# Patient Record
Sex: Male | Born: 1945 | Race: White | Hispanic: No | Smoking: Current every day smoker
Health system: Southern US, Community
[De-identification: ages and names within clinical notes are randomized; demographics above are authoritative.]

## PROBLEM LIST (undated history)

## (undated) DIAGNOSIS — K219 Gastro-esophageal reflux disease without esophagitis: Secondary | ICD-10-CM

## (undated) DIAGNOSIS — H3530 Unspecified macular degeneration: Secondary | ICD-10-CM

## (undated) DIAGNOSIS — A809 Acute poliomyelitis, unspecified: Secondary | ICD-10-CM

## (undated) DIAGNOSIS — Z87442 Personal history of urinary calculi: Secondary | ICD-10-CM

## (undated) DIAGNOSIS — G4700 Insomnia, unspecified: Secondary | ICD-10-CM

## (undated) DIAGNOSIS — M1990 Unspecified osteoarthritis, unspecified site: Secondary | ICD-10-CM

## (undated) DIAGNOSIS — N400 Enlarged prostate without lower urinary tract symptoms: Secondary | ICD-10-CM

## (undated) DIAGNOSIS — Z860100 Personal history of colon polyps, unspecified: Secondary | ICD-10-CM

## (undated) DIAGNOSIS — T7840XA Allergy, unspecified, initial encounter: Secondary | ICD-10-CM

## (undated) DIAGNOSIS — I10 Essential (primary) hypertension: Secondary | ICD-10-CM

## (undated) DIAGNOSIS — I714 Abdominal aortic aneurysm, without rupture, unspecified: Secondary | ICD-10-CM

## (undated) DIAGNOSIS — M109 Gout, unspecified: Secondary | ICD-10-CM

## (undated) DIAGNOSIS — J449 Chronic obstructive pulmonary disease, unspecified: Secondary | ICD-10-CM

## (undated) DIAGNOSIS — G20 Parkinson's disease: Secondary | ICD-10-CM

## (undated) DIAGNOSIS — F419 Anxiety disorder, unspecified: Secondary | ICD-10-CM

## (undated) DIAGNOSIS — Z8709 Personal history of other diseases of the respiratory system: Secondary | ICD-10-CM

## (undated) DIAGNOSIS — I719 Aortic aneurysm of unspecified site, without rupture: Secondary | ICD-10-CM

## (undated) HISTORY — DX: Essential (primary) hypertension: I10

## (undated) HISTORY — DX: Gout, unspecified: M10.9

## (undated) HISTORY — DX: Anxiety disorder, unspecified: F41.9

## (undated) HISTORY — DX: Aortic aneurysm of unspecified site, without rupture: I71.9

## (undated) HISTORY — PX: APPENDECTOMY: SHX54

## (undated) HISTORY — DX: Abdominal aortic aneurysm, without rupture, unspecified: I71.40

## (undated) HISTORY — DX: Gastro-esophageal reflux disease without esophagitis: K21.9

## (undated) HISTORY — DX: Abdominal aortic aneurysm, without rupture: I71.4

## (undated) HISTORY — DX: Parkinson's disease: G20

## (undated) HISTORY — PX: OTHER SURGICAL HISTORY: SHX169

## (undated) HISTORY — PX: CHOLECYSTECTOMY: SHX55

---

## 2001-01-01 ENCOUNTER — Emergency Department (HOSPITAL_COMMUNITY): Admission: EM | Admit: 2001-01-01 | Discharge: 2001-01-01 | Payer: Self-pay | Admitting: Internal Medicine

## 2002-03-31 ENCOUNTER — Emergency Department (HOSPITAL_COMMUNITY): Admission: EM | Admit: 2002-03-31 | Discharge: 2002-03-31 | Payer: Self-pay | Admitting: *Deleted

## 2002-10-25 ENCOUNTER — Emergency Department (HOSPITAL_COMMUNITY): Admission: EM | Admit: 2002-10-25 | Discharge: 2002-10-25 | Payer: Self-pay | Admitting: Internal Medicine

## 2004-06-17 ENCOUNTER — Emergency Department (HOSPITAL_COMMUNITY): Admission: EM | Admit: 2004-06-17 | Discharge: 2004-06-18 | Payer: Self-pay | Admitting: Emergency Medicine

## 2004-07-19 DIAGNOSIS — G20A1 Parkinson's disease without dyskinesia, without mention of fluctuations: Secondary | ICD-10-CM

## 2004-07-19 DIAGNOSIS — G20 Parkinson's disease: Secondary | ICD-10-CM

## 2004-07-19 HISTORY — DX: Parkinson's disease without dyskinesia, without mention of fluctuations: G20.A1

## 2004-07-19 HISTORY — DX: Parkinson's disease: G20

## 2004-11-18 ENCOUNTER — Emergency Department (HOSPITAL_COMMUNITY): Admission: EM | Admit: 2004-11-18 | Discharge: 2004-11-18 | Payer: Self-pay | Admitting: *Deleted

## 2004-11-19 ENCOUNTER — Emergency Department (HOSPITAL_COMMUNITY): Admission: EM | Admit: 2004-11-19 | Discharge: 2004-11-20 | Payer: Self-pay | Admitting: *Deleted

## 2004-11-21 ENCOUNTER — Emergency Department (HOSPITAL_COMMUNITY): Admission: EM | Admit: 2004-11-21 | Discharge: 2004-11-21 | Payer: Self-pay | Admitting: Emergency Medicine

## 2005-07-19 DIAGNOSIS — I719 Aortic aneurysm of unspecified site, without rupture: Secondary | ICD-10-CM

## 2005-07-19 HISTORY — PX: KIDNEY SURGERY: SHX687

## 2005-07-19 HISTORY — DX: Aortic aneurysm of unspecified site, without rupture: I71.9

## 2005-07-19 HISTORY — PX: ABDOMINAL AORTIC ANEURYSM REPAIR: SHX42

## 2005-10-07 ENCOUNTER — Ambulatory Visit (HOSPITAL_COMMUNITY): Admission: RE | Admit: 2005-10-07 | Discharge: 2005-10-07 | Payer: Self-pay | Admitting: Pulmonary Disease

## 2005-10-15 ENCOUNTER — Ambulatory Visit (HOSPITAL_COMMUNITY): Admission: RE | Admit: 2005-10-15 | Discharge: 2005-10-15 | Payer: Self-pay | Admitting: Pulmonary Disease

## 2005-11-04 ENCOUNTER — Ambulatory Visit (HOSPITAL_COMMUNITY): Admission: RE | Admit: 2005-11-04 | Discharge: 2005-11-04 | Payer: Self-pay | Admitting: Vascular Surgery

## 2005-11-08 ENCOUNTER — Inpatient Hospital Stay (HOSPITAL_COMMUNITY): Admission: RE | Admit: 2005-11-08 | Discharge: 2005-11-14 | Payer: Self-pay | Admitting: Vascular Surgery

## 2006-02-28 ENCOUNTER — Inpatient Hospital Stay (HOSPITAL_COMMUNITY): Admission: RE | Admit: 2006-02-28 | Discharge: 2006-03-03 | Payer: Self-pay | Admitting: Urology

## 2006-02-28 ENCOUNTER — Encounter (INDEPENDENT_AMBULATORY_CARE_PROVIDER_SITE_OTHER): Payer: Self-pay | Admitting: *Deleted

## 2006-05-26 ENCOUNTER — Ambulatory Visit (HOSPITAL_COMMUNITY): Admission: RE | Admit: 2006-05-26 | Discharge: 2006-05-26 | Payer: Self-pay | Admitting: Urology

## 2006-09-09 ENCOUNTER — Ambulatory Visit (HOSPITAL_COMMUNITY): Admission: RE | Admit: 2006-09-09 | Discharge: 2006-09-09 | Payer: Self-pay | Admitting: Urology

## 2006-12-27 ENCOUNTER — Ambulatory Visit (HOSPITAL_COMMUNITY): Admission: RE | Admit: 2006-12-27 | Discharge: 2006-12-27 | Payer: Self-pay | Admitting: Urology

## 2007-04-21 ENCOUNTER — Ambulatory Visit (HOSPITAL_COMMUNITY): Admission: RE | Admit: 2007-04-21 | Discharge: 2007-04-21 | Payer: Self-pay | Admitting: Urology

## 2007-08-25 ENCOUNTER — Ambulatory Visit (HOSPITAL_COMMUNITY): Admission: RE | Admit: 2007-08-25 | Discharge: 2007-08-25 | Payer: Self-pay | Admitting: Urology

## 2007-12-27 ENCOUNTER — Ambulatory Visit (HOSPITAL_COMMUNITY): Admission: RE | Admit: 2007-12-27 | Discharge: 2007-12-27 | Payer: Self-pay | Admitting: Urology

## 2008-06-18 ENCOUNTER — Ambulatory Visit (HOSPITAL_COMMUNITY): Admission: RE | Admit: 2008-06-18 | Discharge: 2008-06-18 | Payer: Self-pay | Admitting: Pulmonary Disease

## 2008-08-21 ENCOUNTER — Ambulatory Visit (HOSPITAL_COMMUNITY): Admission: RE | Admit: 2008-08-21 | Discharge: 2008-08-21 | Payer: Self-pay | Admitting: Pulmonary Disease

## 2008-08-28 ENCOUNTER — Encounter (HOSPITAL_COMMUNITY): Admission: RE | Admit: 2008-08-28 | Discharge: 2008-09-27 | Payer: Self-pay | Admitting: Pulmonary Disease

## 2009-11-07 ENCOUNTER — Ambulatory Visit (HOSPITAL_COMMUNITY): Admission: RE | Admit: 2009-11-07 | Discharge: 2009-11-07 | Payer: Self-pay | Admitting: Pulmonary Disease

## 2009-11-24 ENCOUNTER — Ambulatory Visit (HOSPITAL_COMMUNITY)
Admission: RE | Admit: 2009-11-24 | Discharge: 2009-11-24 | Payer: Self-pay | Source: Home / Self Care | Admitting: General Surgery

## 2010-06-30 ENCOUNTER — Ambulatory Visit: Payer: Self-pay | Admitting: Internal Medicine

## 2010-07-29 ENCOUNTER — Ambulatory Visit: Admit: 2010-07-29 | Payer: Self-pay | Admitting: Internal Medicine

## 2010-07-29 ENCOUNTER — Ambulatory Visit (HOSPITAL_COMMUNITY)
Admission: RE | Admit: 2010-07-29 | Discharge: 2010-07-29 | Payer: Self-pay | Source: Home / Self Care | Attending: Internal Medicine | Admitting: Internal Medicine

## 2010-08-10 NOTE — Op Note (Signed)
  NAME:  Brett Russell, Brett Russell NO.:  [id number]  MEDICAL RECORD NO.:  [id number]          PATIENT TYPE:  AMB  LOCATION:  DAY                           FACILITY:  [facility]  PHYSICIAN:  Lionel December, M.D.    DATE OF BIRTH:  1945-11-27  DATE OF PROCEDURE:  07/29/2010 DATE OF DISCHARGE:                              OPERATIVE REPORT   PREOPERATIVE DIAGNOSIS:  POSTOPERATIVE DIAGNOSIS:  OPERATION:  Esophagogastroduodenoscopy.  ANESTHESIA:  INDICATION:  The patient is a 65 year old Caucasian male who presented with melena about 4 weeks ago.  He had been on ibuprofen which was discontinued.  He has been maintained on a PPI.  In the meantime, he has also been treated for H. pylori infection.  We recommended EGD, but he could not come earlier.  He denies abdominal pain, anorexia, weight loss, and he has not had anymore episodes of melena.  Procedure risks were reviewed with the patient and informed consent was obtained.  MEDICATIONS FOR CONSCIOUS SEDATION:  Cetacaine spray for pharyngeal topical anesthesia, Demerol 50 mg IV, Versed 6 mg IV.  FINDINGS:  Procedure performed in endoscopy suite.  The patient's vital signs and O2 sats were monitored during the procedure and remained stable.  The patient was placed in left lateral recumbent position and Pentax videoscope was passed through oropharynx without any difficulty into esophagus.  Mucosa of the esophagus was normal.  GE junction was at 40 cm from the incisors and was unremarkable.  Stomach was empty and distended very well with insufflation.  Folds of proximal stomach were normal.  Examination of mucosa revealed two antral scars, but no erosions or ulcers were noted.  There was some erythema to prepyloric and pyloric channel mucosa, but pyloric channel was patent. Angularis, fundus, and cardia were examined by retroflexing the scope and were normal.  Duodenum, bulbar mucosa was normal.  Scope was passed in the second  part of duodenum where mucosa and folds were normal.  Endoscope was withdrawn.  The patient tolerated the procedure well.  FINAL DIAGNOSES: 1. No evidence of gastric neoplasm or peptic ulcer disease. 2. Two antral scars indicative of previous ulceration with healing.  Nonspecific prepyloric, pyloric channel erythema.  Please note, he has been treated for H. pylori infection.  RECOMMENDATIONS:  The patient will continue lansoprazole 30 mg p.o. q.a.m.  He will resume his usual meds.  The patient advised not to take any NSAIDs.  If Dr. Juanetta Gosling feels that he should be on baby aspirin or even a low dose, I do not see any contraindication from a GI standpoint.  Should he experience another episode of melena, he will give [place] a call.     Lionel December, M.D.     NR/MEDQ  D:  07/29/2010  T:  07/30/2010  Job:  [id number]  cc:   Dr. Juanetta Gosling.  Electronically Signed by Lionel December M.D. on 08/10/2010 12:19:27 PM

## 2010-10-06 LAB — CBC
HCT: 48.4 % (ref 39.0–52.0)
Hemoglobin: 17 g/dL (ref 13.0–17.0)
MCHC: 35.1 g/dL (ref 30.0–36.0)
MCV: 91.1 fL (ref 78.0–100.0)
Platelets: 211 10*3/uL (ref 150–400)
RBC: 5.32 MIL/uL (ref 4.22–5.81)
RDW: 14.2 % (ref 11.5–15.5)
WBC: 8.9 10*3/uL (ref 4.0–10.5)

## 2010-10-06 LAB — BASIC METABOLIC PANEL
BUN: 11 mg/dL (ref 6–23)
CO2: 30 mEq/L (ref 19–32)
Calcium: 9.6 mg/dL (ref 8.4–10.5)
Chloride: 101 mEq/L (ref 96–112)
Creatinine, Ser: 0.68 mg/dL (ref 0.4–1.5)
GFR calc Af Amer: >60 mL/min (ref >60)
GFR calc non Af Amer: >60 mL/min (ref >60)
Glucose, Bld: 79 mg/dL (ref 70–99)
Potassium: 4.1 mEq/L (ref 3.5–5.1)
Sodium: 139 mEq/L (ref 135–145)

## 2010-10-06 LAB — HEPATIC FUNCTION PANEL
ALT: 12 U/L (ref 0–53)
AST: 17 U/L (ref 0–37)
Alkaline Phosphatase: 97 U/L (ref 39–117)
Bilirubin, Direct: 0.2 mg/dL (ref 0.0–0.3)
Indirect Bilirubin: 0.4 mg/dL (ref 0.3–0.9)

## 2010-12-04 NOTE — Discharge Summary (Signed)
NAME:  Brett Russell, Brett Russell NO.:  [id number]   MEDICAL RECORD NO.:  [id number]          PATIENT TYPE:  INP   LOCATION:  1418                         FACILITY:  [facility]   PHYSICIAN:  Bertram Millard. Dahlstedt, M.D.DATE OF BIRTH:  Feb 12, 1946   DATE OF ADMISSION:  02/28/2006  DATE OF DISCHARGE:  03/03/2006                                 DISCHARGE SUMMARY   ATTENDING PHYSICIAN AT DISCHARGE:  Bertram Millard. Dahlstedt, M.D., of [facility].   ADMITTING DIAGNOSIS:  Left upper pole renal mass.   DISCHARGE DIAGNOSIS:  Left upper pole renal mass.   PROCEDURE DONE DURING HOSPITAL STAY:  1. Laparoscopy with lysis of extensive intraabdominal adhesions.  2. Left laparoscopic partial nephrectomy done on February 28, 2006.   HISTORY OF PRESENT ILLNESS AND HOSPITAL COURSE:  Brett Russell is a 65-year-  old gentleman who was incidentally found to have a left renal mass on  evaluation of abdominal aortic aneurysm in the Spring.  The patient  underwent repair of a 6 cm aneurysm by Dr. Hart Rochester of Vascular Surgery.  After an uneventful postoperative course, the patient was counseled about  several therapeutic options that are available to treat his 3 cm enhancing  exophytic left renal mass.  The patient finally elected a laparoscopic  partial nephrectomy approach.  Patient was admitted on February 28, 2006,  where he successfully underwent the procedure.  His postoperative course was  relatively smooth and uneventful.  His pain was initially controlled by IV  narcotics and he quickly was switched to p.o. medications once he tolerated  diet, which was postop day 1.  The patient's Foley catheter was discontinued  on postop day 1.  JP drain was taken out on postop day 2 after creatinine  levels were consistent with serum.  The patient was ambulating and was  hemodynamically stable.  By postop day 3, the patient was deemed stable to  go home.  On discharge, the patient was afebrile, his  vital signs were  stable, he was in no acute distress.  He was awake, alert and oriented.  He  had clear breath sounds bilaterally.  His abdomen was soft and nontender.  Wounds were healing well with no evidence of cellulitis, dehiscence or  drainage.  Gross motor neurological exam was nonfocal.  Patient was then  discharged home.  He was given a prescription of Vicodin for pain control  and Colace as a stool softener.  He is to follow up in 1 week with Dr.  Retta Diones to have his staples removed.  Should the patient have any fevers,  abdominal pain, nausea, vomiting, chills or would drainage or dehiscence, he  is to contact us immediately or come to the emergency room.     ______________________________  Brett Bryant, MD      Bertram Millard. Dahlstedt, M.D.  Electronically Signed   SK/MEDQ  D:  03/03/2006  T:  03/03/2006  Job:  [id number]

## 2010-12-04 NOTE — H&P (Signed)
NAME:  Brett Russell, Brett Russell NO.:  [id number]   MEDICAL RECORD NO.:  [id number]          PATIENT TYPE:  AMB   LOCATION:  SDS                          FACILITY:  [facility]   PHYSICIAN:  Quita Skye. Hart Rochester, M.D.  DATE OF BIRTH:  March 02, 1946   DATE OF ADMISSION:  DATE OF DISCHARGE:                                HISTORY & PHYSICAL   CHIEF COMPLAINT:  Abdominal aortic aneurysm.   HISTORY OF PRESENT ILLNESS:  Brett Russell is a pleasant 65 year old Caucasian  male who had been experiencing a lot of discomfort in his legs as well as  joint pain.  The patient underwent a workup which led to a CT scan.  The CT  scan revealed a 6.3 cm abdominal aortic aneurysm as well as a mass in the  apex of his left kidney, which is 2.__________ cm in diameter, suggestive of  renal cell cancer.  Following CT scan, the patient was referred to Dr.  Hart Rochester.  The patient presents today for his history and physical.  He denies  any abdominal pain, back pain, nausea or vomiting, constipation,  claudication symptoms, dysuria, hematuria, weakness, lightheadedness, TIA or  CVA symptoms.  The patient states that the discomfort in his legs has  improved since he has been off work for the last several days.  The patient  was sent to see Dr. Vonita Moss for the mass in his kidney.  Following  evaluation by Dr. Larey Dresser, Dr. Hart Rochester discussed with him.  According  to the patient, Dr. Vonita Moss felt that repair of his abdominal aortic  aneurysm was priority over the renal mass.  Dr. Vonita Moss will plan to follow  up with the patient after surgery.   PAST MEDICAL HISTORY:  1.  Hypertension.  2.  Hyperlipidemia, which is diet and exercise-controlled.  3.  History of gout.  4.  History of Parkinson's disease.  5.  History of polio as a child.  6.  History of kidney infections.  7.  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   PAST SURGICAL HISTORY:  1.  Status post surgery, right knee.  2.  Status post  appendectomy.   ALLERGIES:  The patient states allergic to VIBRAMYCIN, causes rash.   MEDICATIONS:  1.  Allopurinol 300 mg daily.  2.  Xanax 0.5-1 mg as needed t.i.d.  3.  Sinemet 100/25 mg t.i.d.  4.  Norvasc 10 mg daily.  5.  Prevacid 30 mg daily.   SOCIAL HISTORY:  The patient currently lives with family, is single, has  three children.  Has a history of tobacco abuse, in which he smoked one pack  per day over the past 30 years.  The patient also has a history of alcohol  use, which he states he quit drinking six months ago.  The patient is  currently working as an Personnel officer.  [place]   FAMILY HISTORY:  Mother positive for brain cancer.  Father positive for  abdominal aortic aneurysm as well as end-stage renal disease and  hypertension.   REVIEW OF SYSTEMS:  See HPI for pertinent  positives and negatives.  Otherwise, review of systems within normal limits.  The patient denies any  recent fevers, night sweats, chills.  He denies any recent changes in  vision, hearing, difficulty swallowing.  The patient denies any coughing,  hemoptysis or wheezing.  He denies any chest pain, shortness of breath,  orthopnea, paroxysmal nocturnal dyspnea, palpitations.  He denies any  urgency, frequency, dysuria or hematuria.  He denies any muscle weakness,  amaurosis fugax.   PHYSICAL EXAMINATION:  GENERAL:  A well-developed, well-nourished white male  in no acute distress.  VITAL SIGNS:  Blood pressure of 118/82, pulse of 76, respirations of 18.  HEENT:  Normocephalic, atraumatic.  Pupils equal, round and reactive to  light and accommodation.  Extraocular movements intact.  Oral mucosa is pink  and moist.  NECK:  Supple.  No carotid bruits noted.  RESPIRATORY:  Clear to auscultation bilaterally.  CARDIAC:  Regular rate and rhythm with S1 and S2 noted.  ABDOMEN:  Bowel sounds x4.  Soft, nontender on palpation.  No bruits noted.  GENITOURINARY, RECTAL:   Deferred.  EXTREMITIES:  No edema, cyanosis or clubbing noted.  The patient's upper and  lower extremities are warm and well-perfused.  He has 2+ bilateral radial,  femoral, DP and PT pulses noted.  NEUROLOGIC:  Cranial nerves II-XII intact.  The patient is alert and  oriented x4.  Gait steady.  Muscle strength 5/5, upper and lower extremities  bilaterally.   STUDIES:  The patient underwent bilateral carotid duplex ultrasound done  November 03, 2005, showing no significant ICA stenosis bilaterally.  The  patient also underwent a Myoview study by Dr. Deborah Chalk on November 02, 2005,  showing normal adenosine Cardiolite.  No evidence of ischemia, with normal  LV function.   IMPRESSION AND PLAN:  Brett Russell is a 65 year old Caucasian male seen with  a 6.3 abdominal aortic aneurysm.  The patient was seen and evaluated by Dr.  Hart Rochester.  Dr. Hart Rochester discussed with the patient doing an abdominal aortogram  with bilateral runoff to further evaluate the patient's vessels.  He then  discussed performing abdominal aortic aneurysm repair and grafting following  the aortogram, which would be on November 08, 2005.  The risks and benefits of  surgery were discussed with the patient.  The patient acknowledged  understanding and agreed to proceed.  Abdominal aortogram is scheduled for  April 19 with the repair and grafting of abdominal aortic aneurysm for November 08, 2005.  The patient is instructed to contact the office or present to the  emergency room if he develops any severe abdominal pain or back pain prior  to surgery.      Theda Belfast, PA    ______________________________  Quita Skye Hart Rochester, M.D.    KMD/MEDQ  D:  11/03/2005  T:  11/03/2005  Job:  [id number]

## 2010-12-04 NOTE — Op Note (Signed)
NAME:  Brett Russell, Brett Russell NO.:  [id number]   MEDICAL RECORD NO.:  [id number]          PATIENT TYPE:  AMB   LOCATION:  SDS                          FACILITY:  [facility]   PHYSICIAN:  Quita Skye. Hart Rochester, M.D.  DATE OF BIRTH:  [birth year]   DATE OF PROCEDURE:  11/04/2005  DATE OF DISCHARGE:                                 OPERATIVE REPORT   PREOPERATIVE DIAGNOSIS:  Infrarenal abdominal aortic aneurysm.   POSTOPERATIVE DIAGNOSIS:  Infrarenal abdominal aortic aneurysm.   PROCEDURE:  Biplane abdominal aortogram with bilateral iliac angiography via  right common femoral approach.   SURGEON:  Dr. Hart Rochester.   ANESTHESIA:  Local Xylocaine.   CONTRAST:  175 cc.   COMPLICATIONS:  None.   DESCRIPTION OF PROCEDURE:  The patient was taken to [facility], placed in supine position at which time the right groin  was prepped with Betadine solution and draped in routine sterile manner.  After infiltration of 1% Xylocaine, right common femoral artery was entered  percutaneously.  Guidewire passed through suprarenal aorta under  fluoroscopic guidance.  A 5-French sheath and dilator were passed over the  guide wire, dilator removed, and a standard pigtail catheter positioned in  the suprarenal aorta.  Flush abdominal aortogram was performed, injecting 30  mL of contrast at 20 cc per second.  This revealed the aorta to be widely  patent with rapid filling of the superior mesenteric artery and both renal  arteries which were single and no stenoses noted.  There was a large  infrarenal aortic aneurysm with an approximate 3-cm length neck between the  renal arteries and the aneurysm which extended over to the patient's left  side.  It extended down to the aortic bifurcation. Both common iliac  arteries appeared widely patent.  A lateral aortogram was then performed,  injecting 20 cc of contrast at 20 cc per second, revealing slight anterior  angulation of the neck  of the aneurysm but less than 10 degrees.  The  superior mesenteric artery and celiac axis were widely patent.  Additional  views of the renal arteries were obtained using an RAO projection with 20 cc  of contrast injected to get a better view of the neck of the aneurysm.  Catheter was then withdrawn at the terminal aorta, and iliac angiography  performed with an AP, RAO and LAO projections at 25 degrees revealing widely  patent common internal and external iliac arteries bilaterally.  Catheter  was removed over the guidewire, sheath removed, adequate compression  applied.  No complications ensued.   FINDINGS:  1.  Large infrarenal abdominal aortic aneurysm with 3-cm infrarenal neck.  2.  Single widely patent renal arteries bilaterally.  3.  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  4.  Widely patent common internal and external iliac arteries           ______________________________  Quita Skye. Hart Rochester, M.D.     JDL/MEDQ  D:  11/04/2005  T:  11/04/2005  Job:  [id number]

## 2010-12-04 NOTE — Discharge Summary (Signed)
NAME:  RAWLINS, STUARD NO.:  [id number]   MEDICAL RECORD NO.:  [id number]          PATIENT TYPE:  INP   LOCATION:  2001                         FACILITY:  [facility]   PHYSICIAN:  Quita Skye. Hart Rochester, M.D.  DATE OF BIRTH:  Dec 07, 1945   DATE OF ADMISSION:  11/08/2005  DATE OF DISCHARGE:  11/14/2005                                 DISCHARGE SUMMARY   ADMISSION DIAGNOSES:  1.  Infrarenal abdominal aortic aneurysm, 6.3 cm.  2.  Umbilical hernia.   DISCHARGE/SECONDARY DIAGNOSES:  1.  Infrarenal abdominal aortic aneurysm, status post repair.  2.  Umbilical hernia, status post repair.  3.  Intraoperative small capsular tear of the spleen with no signs of      bleeding postoperatively.  4.  Tobacco dependence, status post tobacco cessation consult.  5.  Parkinson's disease.  6.  Polio as a child.  7.  Hypertension.  8.  Hyperlipidemia, which is diet and exercise-controlled.  9.  History of gout next.  10. History of kidney infections.  11. Left renal mass concerning for renal cell carcinoma, which is being      evaluated by Dr. Larey Dresser.  12. History of right knee surgery.  13. History of appendectomy.  14. Anxiety.  15. Allergy to VIBRAMYCIN, which causes a rash.   PROCEDURES:  November 08, 2005, resection and grafting of renal abdominal  aortic aneurysm with insertion of aorto-bi common iliac graft using a 14 by  8 mm Hemashield Dacron graft, and umbilical hernia repair by Dr. Josephina Gip.   BRIEF HISTORY:  Mr. Sells is a 65 year old Caucasian male who had been  experiencing a lot of discomfort in his legs as well as joint pain.  He  underwent workup which led to a CT scan, which revealed a 6.3 cm abdominal  aortic aneurysm as well as a mass in the apex of his left kidney suggestive  of renal cell carcinoma.  Following the CT scan, the patient was referred to  Dr. Quita Skye. Hart Rochester for consideration of surgical repair of his aneurysm.  He has had no abdominal or  back pain, nausea, vomiting or claudication  symptoms.  The discomfort in his legs improved after taking time off work.  He was also seen by Dr. Larey Dresser for workup of his left renal mass.  It was determined that he should undergo a repair of his aneurysm prior to  proceeding with further evaluation of his renal mass.  Prior to scheduling  his aneurysm repair, he underwent a Myoview study by Dr. Roger Shelter on  April 17, which showed normal adenosine Cardiolite with no evidence of  ischemia and normal LV function.  Carotid Dopplers were done as well showing  no significant internal carotid artery stenosis bilaterally.   HOSPITAL COURSE:  Mr. Jurgens was electively admitted to [facility]  on November 08, 2005, to undergo repair of his abdominal aortic aneurysm.  Intraoperatively he did sustain a small capsular tear of the spleen.  He was  placed to short-term bed rest with frequent checks of his hemoglobin and  hematocrit,  which remained stable.  He was extubated neurologically intact,  transferred to the surgical intensive care unit, where he remained until  postoperative day #2.  His pain was initially controlled with morphine PCA  but was discontinued because the patient thought it made him feel funny.  His nasogastric tube was discontinued on postop day #1, as this had minimal  output and the abdomen remained soft.  His diet was advanced slowly as his  bowel function returned.  Dulcolax suppositories were used with positive  results.  By postoperative day #4, he was advanced to a clear liquid diet  with plans to advance him to a full liquid diet the following morning and  soft diet by lunch April 28. After transfer from the ICU, the patient was  sent to the telemetry unit to unit 2000, and it is anticipated he will  remain there until discharge.  He remained hemodynamically stable with  latest vitals showing blood pressure 124//77, heart rate in the 80s to 90s.  He is  afebrile and saturating above 92% on 2 L per nasal cannula.  It is  anticipated that the supplemental could be weaned without difficulty prior  to discharge.  Due to his history of smoking, he started on a nicotine patch  and did undergo a smoking cessation consult.  Postoperatively his incisions  appear to be healing well without signs of infection.  His extremities  remained well-perfused with palpable pulses.  His heart had a regular rate  and lung sounds were clear throughout.  He is stable to mobilize without  difficulty and his pain control with oral medication.  If he continues to  progress in this manner and is able to tolerate a regular diet, it is  anticipated that he will be ready for discharge home on postoperative day  #6, November 14, 2005.   At the time of dictation, most recent labs are stable showing a sodium of  133, potassium 3.6, chloride 99, CO2 27blood glucose 99, BUN 7, creatinine  0.7, calcium 8.5.  white blood count of 4.5, hemoglobin 12.4, hematocrit  36.3, platelet count 240.  Total bilirubin is 0.7, alkaline phosphatase 47,  AST 14, ALT 13, total protein of 4.6.  His amylase was elevated at 207 on  postoperative day #1.   DISCHARGE MEDICATIONS:  1.  Tylox one to two tablets p.o. q.4h. p.r.n. pain.  2.  Norvasc 10 mg p.o. daily.  3.  Allopurinol 300 mg p.o. daily.  4.  Sinemet 25/100 mg one tablet p.o. t.i.d.  5.  Prevacid 30 mg. daily.  6.  Xanax 1 mg one-half to one tablet p.o. t.i.d. p.r.n.  7.  nicotine patch 21 mg transdermal daily.   DISCHARGE INSTRUCTIONS:  He is to resume a low-fat, low-salt diet.  He is to  avoid driving or lifting for the next three weeks but was encouraged to  continue his daily walking and breathing exercises.  He may shower clean his  incisions gently with soap and water but should call if he develops fever  greater than 101, redness or drainage from his incision sites or abdominal pain or increasing distension or for persistent  nausea or vomiting.   FOLLOW-UP:  He is to follow up with the [facility] office for staple removal on  Monday Nov 22, 2005, at 10:00 a.m.  He was in the Dr. Hart Rochester in follow-up on  Dec 07, 2005, at 4 p.m.  He should schedule a follow-up with Dr. Juanetta Gosling for  his  medical management and further discussion on smoking cessation.  He  should also schedule a follow-up with Dr. Vonita Moss further evaluation of his  renal mass.      Jerold Coombe, P.A.    ______________________________  Quita Skye Hart Rochester, M.D.    AWZ/MEDQ  D:  11/12/2005  T:  11/13/2005  Job:  [id number]   cc:   Maretta Bees. Vonita Moss, M.D.  Fax: [phone]   Oneal Deputy. Juanetta Gosling, M.D.  Fax: [phone]   Colleen Can. Deborah Chalk, M.D.  Fax: [phone]

## 2010-12-04 NOTE — Op Note (Signed)
NAME:  Brett Russell, Brett Russell NO.:  [id number]   MEDICAL RECORD NO.:  [id number]          PATIENT TYPE:  INP   LOCATION:  1418                         FACILITY:  [facility]   PHYSICIAN:  Bertram Millard. Dahlstedt, M.D.DATE OF BIRTH:  May 01, 1946   DATE OF PROCEDURE:  02/28/2006  DATE OF DISCHARGE:                                 OPERATIVE REPORT   PREOPERATIVE DIAGNOSIS:  Left upper pole renal mass.   POSTOPERATIVE DIAGNOSIS:  Left upper pole renal mass.   PRINCIPAL PROCEDURE:  Laparoscopy, lysis of extensive intraabdominal  adhesions, left laparoscopic partial nephrectomy.   SURGEON:  Bertram Millard. Dahlstedt, M.D.   FIRST ASSISTANT:  Crecencio Mc, M.D.   SECOND ASSISTANT:  Cornelious Bryant, M.D.   ANESTHESIA:  General endotracheal.   ESTIMATED BLOOD LOSS:  100 cc.   SPECIMENS:  1. Left renal mass.  2. Perinephric fat.  3. Renal margin.  4. Base of tumor.   BRIEF HISTORY:  This 65 year old gentleman was incidentally found to have a  left renal mass on evaluation for an abdominal aortic aneurysm this spring.  The patient had a 6-cm aneurysm which was recently fixed by Dr. Hart Rochester.  His  postoperative course was essentially unremarkable.   The patient was found to have a 3-cm enhancing left renal mass on CT scan.  He has had negative metastatic survey.  As this is an exophytic mass, the  patient was counseled on possible laparoscopic versus open partial versus  total nephrectomy.  He has chosen to have minimally invasive therapy.  He is  aware of risks and complications of the procedure which include but are not  limited to need to convert to open procedure, needing to take the total  kidney, bleeding, need for transfusion, postoperative bleeding,  postoperative urinoma, infection, DVT, PE, and injury to surrounding organs.  He understands these and agrees to proceed.   DESCRIPTION OF PROCEDURE:  Mr. Veney was identified in the holding area,  surgical side marked, and  administered preoperative IV antibiotics.  He was  taken to the operating room where general endotracheal anesthetic was  administered.  His bladder was catheterized and NG tube was placed.  He was  placed in the left flank-up position, the table flexed, and all extremities  padded appropriately.  His left flank and anterior abdomen were prepped and  draped.  We tried two Veress needle insufflations of his abdomen, one in the  left upper and one in the left lower quadrant.  These were unsuccessful as  the gas reached high pressures on low flow.  We then, coming through the  lower abdominal Veress needle site, made a direct entry into the abdomen  with a Hasson cannula through a 12-mm incision.  Once inspection of the  abdomen was carried out, it was evident that there were extensive anterior  abdominal adhesions with the colon and small bowel, as well as omentum.  We  were able to take care of these adhesions, first with laparoscopic scissors.  Once significant adhesions were taken down, we were able to put two more  trocars in.  The first  was a 12-mm port just superior to the umbilicus, to  the left of the midline.  There was a 5-mm port placed in the upper midline,  just beneath the xiphisternum port.  These were placed under direct vision.  Through these trocar sites, the left colon and hepatic flexure were taken  down.  There were extensive adhesions of the left colon to the anterior  abdominal wall, as well as to the spleen.  The harmonic scalpel was used to  carefully dissect these adhesions.  Additionally, anteriorly, there was  small bowel stuck to the midline incision.  These were taken down using  sharp dissection and with the harmonic scalpel.  The left colon was the  mobilized.  The hepatic flexure was totally taken down by lysing adhesions  between that and the spleen.  The lateral splenic attachments were taken  down with harmonic scalpel.  The left colon and hepatic flexure  were  reflected medially using combined blunt and sharp dissection.  In this  manner, the anterior surface of Gerota's fascia was identified.  Dissection  was carried down below the left kidney such that the ureter was identified.  We then dissected posterior to this and raised the ureter up anteriorly,  dissecting the lower pole of the kidney off the psoas muscle.  We then moved  superiorly along the medial aspect of the kidney such that the renal vein  was identified.  A small gonadal branch was then clipped and divided.  The  single renal artery was easily identified just above the renal vein.  This  was circumferentially dissected using a right angle.  There was a small  venous branch which came off anterior and actually seemed to go to the  anterior lateral border of the kidney.  This was left intact.  The lateral  attachments to the kidney were carefully dissected with the harmonic scalpel  and freed up.  It took a lot of dissection laterally and superiorly to  mobilize the kidney such that we could eventually dissect around the renal  mass.  Once these were taken down with the harmonic scalpel and splenic  attachments were again divided, we were able to rotate the kidney anteriorly  such that the area of the mass was encountered.  The fat was cleaned off of  the lateral surface of the kidney. We then encountered another renal mass.  This was quite exophytic.  At this point, 12.5 g of mannitol were given  intravenously.  The mass was then carefully circumscribed with all the fat  being removed from it.  This was sent as perinephric fat with the main  specimen.  A bulldog clamp was then placed on the renal artery.  At this  point, the tumor was carefully enucleated with the peripheral margin of the  dissection approximately 5 mm from the renal tumor.  This was taken down  with sharp dissection using scissors.  We dissected deep underneath the mass.  The mass was then laid posteriorly  in the left colic gutter.  At this  point, hemostatic sutures of 2-0 chromic were placed.  Hemostasis was  adequate, although the bulldog clamp was still in place at this point.  The  edges and base of the tumor crater were then cauterized with the argon beam  coagulator.  Floseal was then placed in the defect and two rolled Surgicel  bolsters were placed in this area.  A 2-0 chromic was used to suture these  in place  such that a tight approximation was afforded over the renal defect.  The bulldog clamp was released.  (Clamp time was approximately 40 minutes.)  There was a small amount of bleeding on the inferior edge of this defect.  The argon beam coagulator and more Floseal was then placed.  Surgicel was  placed over top and, with pressure, bleeding had stopped after approximately  2 minutes of holding pressure.   At this point, Dr. Delila Spence called from pathology that margins of the tumor  were negative.  A drain was placed through the 5-mm port in the left  anterior axillary line.  It was sewn in place with 2-0 nylon.  The suture  passer was used to pass 0 Vicryl sutures through the two 12-mm port sites.  At this point, all of the specimens had been removed from the patient.  The  sutures in the 12-mm port sites were tied.  Inspection of the anterior  abdomen prior  to this revealed that there was adequate hemostasis.  There was no  unintended organ injury.  Trocars were all removed.  Staples were used to  reapproximate the skin edges.  Dry sterile dressings were placed.   The patient tolerated the procedure well.  He was awakened, extubated, taken  to the PACU in stable condition.      Bertram Millard. Dahlstedt, M.D.  Electronically Signed     SMD/MEDQ  D:  02/28/2006  T:  03/01/2006  Job:  [id number]   cc:   Quita Skye. Hart Rochester, M.D.  [address]  [place]  [place] [address]   Oneal Deputy. Juanetta Gosling, M.D.  Fax: [phone]

## 2010-12-04 NOTE — Op Note (Signed)
NAME:  Brett Russell, Brett Russell NO.:  [id number]   MEDICAL RECORD NO.:  [id number]          PATIENT TYPE:  INP   LOCATION:  2308                         FACILITY:  [facility]   PHYSICIAN:  Quita Skye. Hart Rochester, M.D.  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   POSTOPERATIVE DIAGNOSIS:  Infrarenal abdominal aortic aneurysm with  umbilical hernia.   OPERATION:  1.  Resection and grafting of infrarenal abdominal aortic aneurysm with      insertion of an aortobicommon iliac graft using a 14 x 8 mm Hemashield      Dacron graft.  2.  Umbilical hernia repair.   SURGEON:  Dr. Hart Rochester   FIRST ASSISTANT:  Dr. Cari Caraway   SECOND ASSISTANT:  Shonna Chock, P.A.   ANESTHESIA:  General endotracheal.   COMPLICATIONS:  A small capsular tear of spleen.   PROCEDURE:  The patient was taken to the operating room, placed in a supine  position at which time satisfactory general endotracheal anesthesia was  administered.  Radial arterial line and Swan-Ganz catheter were inserted by  anesthesia preoperatively.  The abdomen and groins were prepped with  Betadine scrub and solution and draped in routine sterile manner.  Midline  incision was made __________to below the umbilicus, carried down through  subcutaneous tissue and linea alba using the Bovie.  The peritoneal cavity  was entered and thoroughly explored.  Stomach, duodenum, small bowel, and  colon were unremarkable.  Liver was smooth with no palpable masses.  The  gallbladder appeared normal.  No stones were palpable.  The patient was  known to have a mass in the upper pole of the left kidney and while  exploring the left gutter in the upper quadrant, a small capsular tear of  the spleen occurred, and this was about 2 cm in diameter, was observed  throughout  the case for bleeding.  Following the aneurysm resection, this  was observed for an additional 20-25 minutes to be certain no significant  hemorrhage was occurring and was treated with Surgicel in addition to  topical thrombin-coated Gelfoam applied to this area and seemed to  adequately control this.  Transverse colon was elevated, the intestine was  reflected to the right side, retroperitoneum incised exposing the aorta from  the renal arteries to the bifurcation.  There was 6-6.5 cm aneurysm with a  long infrarenal neck 2-3 cm in size.  An accessory right renal artery arose  from the neck, and this was preserved.  Inferior mesenteric artery was also  dissected free.  Common iliac arteries were encircled with Vesseloops, and  care was taken not to injure the inferior hypogastric plexus of nerves as  much as possible at the aortic bifurcation.  The patient was given 25 g of  mannitol and heparinized.  Aorta was occluded distal to the renal arteries  with common iliac arteries occluded with vascular clamps.  Aneurysm was  opened anteriorly.  There was no laminated thrombus within the aneurysm sac.  A few lumbars were oversewn with 2-0 silk figure-of-eight sutures, neck of  the aneurysm transected about 2-3 cm distal to the renal arteries.  Both  common iliac arteries were transected and were widely patent with good  backbleeding.  A 14 x 8 mm Hemashield Dacron graft was anastomosed end-to-  end to the aortic stump, and this was checked for leaks; none were present.  This was done with 3-0 Prolene, buttressing this with a strip of felt.  Both  common iliac arteries were also done in an end-to-end fashion, the left leg  opened first, followed by the right leg with no significant hypotension.  Protamine was then given to reverse the heparin.  Following adequate  hemostasis, aneurysm was closed over the graft with 3-0 Vicryl,  retroperitoneum approximated with 3-0 Vicryl.  The left upper  quadrant was  again examined, and some bleeding had occurred which was then treated as  noted with the Surgicel and topical thrombin on the Gelfoam where the small  capsular laceration was noted, and this seemed to adequately control the  problem.  Linea alba was then closed with continuous #1 Prolene, skin with  clips, sterile dressing applied, the patient taken to the recovery room in  satisfactory condition. The patient received 1 unit of blood from Cell  Saver, no bank blood, and had excellent urinary output and stable  hemodynamically.           ______________________________  Quita Skye Hart Rochester, M.D.     JDL/MEDQ  D:  11/08/2005  T:  11/09/2005  Job:  [id number]   cc:   Maretta Bees. Vonita Moss, M.D.  Fax: [phone]   Oneal Deputy. Juanetta Gosling, M.D.  Fax: [phone]

## 2010-12-04 NOTE — Op Note (Signed)
NAME:  Brett Russell, WAH NO.:  [id number]   MEDICAL RECORD NO.:  [id number]          PATIENT TYPE:  AMB   LOCATION:  SDS                          FACILITY:  [facility]   PHYSICIAN:  Quita Skye. Hart Rochester, M.D.  DATE OF BIRTH:  [birth year]   DATE OF PROCEDURE:  11/08/2005  DATE OF DISCHARGE:  11/04/2005                                 OPERATIVE REPORT   ADDENDUM:   PREOPERATIVE DIAGNOSES:  1.  Infrarenal abdominal aortic aneurysm .  2.  Umbilical hernia.   POSTOPERATIVE DIAGNOSES:  1.  Infrarenal abdominal aortic aneurysm .  2.  Umbilical hernia.   OPERATIONS:  1.  Resection and grafting of abdominal aortic aneurysm.  2.  Umbilical hernia repair.   After the abdomen was opened the umbilical defect was completely dissected  free from within, with the omentum and adipose tissue being excised from the  rim of the defect. This was then closed with two simple 0 Prolene sutures  from within, adequately repairing this defect.           ______________________________  Quita Skye. Hart Rochester, M.D.     JDL/MEDQ  D:  11/08/2005  T:  11/09/2005  Job:  [id number]

## 2010-12-04 NOTE — H&P (Signed)
NAME:  Brett Russell, Brett Russell NO.:  [id number]   MEDICAL RECORD NO.:  [id number]          PATIENT TYPE:  INP   LOCATION:  1418                         FACILITY:  [facility]   PHYSICIAN:  Bertram Millard. Dahlstedt, M.D.DATE OF BIRTH:  1945-07-29   DATE OF ADMISSION:  02/28/2006  DATE OF DISCHARGE:                                HISTORY & PHYSICAL   REASON FOR ADMISSION:  Left renal tumor.   BRIEF HISTORY:  A 65 year old male who was found to have an incidental left  renal mass this spring.  He was being worked up for an aneurysm, and the  abdominal and pelvic CT revealed an enhancing upper pole lesion on the left  kidney.  The patient is status post abdominal aortic aneurysm repaired by  Dr. Hart Rochester approximately 3 months ago.  He has recovered well from this.   The patient has had a negative metastatic survey for this possible left  renal mass.  He presents at this time for possible laparoscopic left partial  nephrectomy.   PAST MEDICAL HISTORY:  1. Abdominal aortic aneurysm.  2. Hypertension.  3. Parkinson's.  4. Remote history of polio.  5. Arthritis.  6. He has gout.  7. He has a history of hiatal hernia.   SURGICAL HISTORY:  Is significant for his aneurysm repair, appendectomy and  knee surgery.   MEDICATIONS:  1. Norvasc 10 mg q.a.m.  2. Allopurinol 300 mg q.a.m.  3. Sinemet 25/100 one p.o. q.a.m.  4. Alprazolam 0.5 mg p.o. b.i.d. p.r.n.  5. Prevacid 30 mg p.o. at noon.  6. Levaquin.  7. Pseudovent.   ALLERGIES:  VIBRAMYCIN gives him a rash, he has mental status changes from  MORPHINE.   SOCIAL HISTORY:  He is a smoker.  He does not drink, but has in the past.  He has three children.   REVIEW OF SYSTEMS:  Noncontributory at the present time.   FAMILY HISTORY:  Significant for heart disease, hypertension and kidney  disease.   PHYSICAL EXAMINATION:  GENERAL:  A pleasant middle-aged male.  There is no  distress.  VITAL SIGNS:  Blood pressure 128/88, pulse  95, respiratory rate 18,  temperature 98.5.  NECK:  His neck was supple.  No supraclavicular or cervical adenopathy.  LUNGS: Clear.  HEART: Normal rate and rhythm.  ABDOMEN: Mildly protuberant, soft, nondistended, nontender.  No mass, no  megaly.  Well-healed midline incision without incisional hernia.  He also  had a right lower quadrant scar without incisional hernia.  No inguinal  hernias.  ]  GU:  Phallus uncircumcised.  Foreskin retracts easily.  No penile plaques,  lesions or fibrotic areas.  Glans normal, meatus normal location signs, no  blood or discharge.  Scrotal skin and testicles, cords and epididymal  structures normal.  Normal anal sphincter tone.  Glans normal without  nodules.  EXTREMITIES:  He had no peripheral edema.   ADMISSION DATA:  CMET was normal, and hemogram was normal, CHEST X-RAY:  Revealed no active disease except for COPD.  EKG revealed a prolonged QT,  with normal sinus rhythm.   IMPRESSION:  1. Left renal mass, probable renal cell carcinoma.  2. History of abdominal aortic aneurysm repair.  3. Hypertension.  4. Parkinson's disease.   PLAN:  Admit for laparoscopic partial versus total nephrectomy.      Bertram Millard. Dahlstedt, M.D.  Electronically Signed     SMD/MEDQ  D:  02/28/2006  T:  02/28/2006  Job:  [id number]   cc:   Quita Skye. Hart Rochester, M.D.  [address]  [place]  [place] [address]   Oneal Deputy. Juanetta Gosling, M.D.  Fax: [phone]

## 2012-04-19 DIAGNOSIS — I10 Essential (primary) hypertension: Secondary | ICD-10-CM | POA: Diagnosis not present

## 2012-04-19 DIAGNOSIS — M545 Low back pain: Secondary | ICD-10-CM | POA: Diagnosis not present

## 2012-05-10 DIAGNOSIS — M545 Low back pain: Secondary | ICD-10-CM | POA: Diagnosis not present

## 2012-05-10 DIAGNOSIS — I10 Essential (primary) hypertension: Secondary | ICD-10-CM | POA: Diagnosis not present

## 2012-05-10 DIAGNOSIS — Z23 Encounter for immunization: Secondary | ICD-10-CM | POA: Diagnosis not present

## 2012-05-15 DIAGNOSIS — R21 Rash and other nonspecific skin eruption: Secondary | ICD-10-CM | POA: Diagnosis not present

## 2012-08-08 DIAGNOSIS — J449 Chronic obstructive pulmonary disease, unspecified: Secondary | ICD-10-CM | POA: Diagnosis not present

## 2012-08-08 DIAGNOSIS — J4489 Other specified chronic obstructive pulmonary disease: Secondary | ICD-10-CM | POA: Diagnosis not present

## 2012-08-08 DIAGNOSIS — G20 Parkinson's disease: Secondary | ICD-10-CM | POA: Diagnosis not present

## 2012-08-08 DIAGNOSIS — E785 Hyperlipidemia, unspecified: Secondary | ICD-10-CM | POA: Diagnosis not present

## 2012-08-08 DIAGNOSIS — I739 Peripheral vascular disease, unspecified: Secondary | ICD-10-CM | POA: Diagnosis not present

## 2012-08-11 DIAGNOSIS — B079 Viral wart, unspecified: Secondary | ICD-10-CM | POA: Diagnosis not present

## 2012-08-11 DIAGNOSIS — L820 Inflamed seborrheic keratosis: Secondary | ICD-10-CM | POA: Diagnosis not present

## 2012-08-11 DIAGNOSIS — D1801 Hemangioma of skin and subcutaneous tissue: Secondary | ICD-10-CM | POA: Diagnosis not present

## 2012-08-17 ENCOUNTER — Encounter (INDEPENDENT_AMBULATORY_CARE_PROVIDER_SITE_OTHER): Payer: Self-pay | Admitting: *Deleted

## 2012-08-25 ENCOUNTER — Encounter (INDEPENDENT_AMBULATORY_CARE_PROVIDER_SITE_OTHER): Payer: Self-pay | Admitting: *Deleted

## 2012-09-05 DIAGNOSIS — M79609 Pain in unspecified limb: Secondary | ICD-10-CM | POA: Diagnosis not present

## 2012-09-05 DIAGNOSIS — M7750 Other enthesopathy of unspecified foot: Secondary | ICD-10-CM | POA: Diagnosis not present

## 2012-09-05 DIAGNOSIS — Q828 Other specified congenital malformations of skin: Secondary | ICD-10-CM | POA: Diagnosis not present

## 2012-10-02 ENCOUNTER — Other Ambulatory Visit (INDEPENDENT_AMBULATORY_CARE_PROVIDER_SITE_OTHER): Payer: Self-pay | Admitting: *Deleted

## 2012-10-02 ENCOUNTER — Telehealth (INDEPENDENT_AMBULATORY_CARE_PROVIDER_SITE_OTHER): Payer: Self-pay | Admitting: *Deleted

## 2012-10-02 ENCOUNTER — Ambulatory Visit (INDEPENDENT_AMBULATORY_CARE_PROVIDER_SITE_OTHER): Payer: Medicare Other | Admitting: Internal Medicine

## 2012-10-02 ENCOUNTER — Encounter (INDEPENDENT_AMBULATORY_CARE_PROVIDER_SITE_OTHER): Payer: Self-pay | Admitting: Internal Medicine

## 2012-10-02 VITALS — BP 120/78 | HR 80 | Temp 97.2°F | Resp 20 | Ht 68.0 in | Wt 174.4 lb

## 2012-10-02 DIAGNOSIS — Z1211 Encounter for screening for malignant neoplasm of colon: Secondary | ICD-10-CM

## 2012-10-02 DIAGNOSIS — K5909 Other constipation: Secondary | ICD-10-CM | POA: Insufficient documentation

## 2012-10-02 DIAGNOSIS — I10 Essential (primary) hypertension: Secondary | ICD-10-CM | POA: Insufficient documentation

## 2012-10-02 DIAGNOSIS — G20A1 Parkinson's disease without dyskinesia, without mention of fluctuations: Secondary | ICD-10-CM | POA: Insufficient documentation

## 2012-10-02 DIAGNOSIS — K649 Unspecified hemorrhoids: Secondary | ICD-10-CM | POA: Diagnosis not present

## 2012-10-02 DIAGNOSIS — M109 Gout, unspecified: Secondary | ICD-10-CM | POA: Insufficient documentation

## 2012-10-02 DIAGNOSIS — G20 Parkinson's disease: Secondary | ICD-10-CM | POA: Insufficient documentation

## 2012-10-02 DIAGNOSIS — K5900 Constipation, unspecified: Secondary | ICD-10-CM | POA: Diagnosis not present

## 2012-10-02 DIAGNOSIS — F419 Anxiety disorder, unspecified: Secondary | ICD-10-CM | POA: Insufficient documentation

## 2012-10-02 MED ORDER — PEG-KCL-NACL-NASULF-NA ASC-C 100 G PO SOLR: 1.0000 | Freq: Once | ORAL | Status: DC

## 2012-10-02 MED ORDER — NYSTATIN-TRIAMCINOLONE 100000-0.1 UNIT/GM-% EX CREA: TOPICAL_CREAM | Freq: Every day | CUTANEOUS | Status: DC

## 2012-10-02 MED ORDER — DOCUSATE SODIUM 100 MG PO CAPS: 200.0000 mg | ORAL_CAPSULE | Freq: Every day | ORAL | Status: DC

## 2012-10-02 NOTE — Telephone Encounter (Signed)
Patient needs movi prep 

## 2012-10-02 NOTE — Patient Instructions (Signed)
Continue high fiber diet. Colace 200 mg by mouth daily at bedtime. Colonoscopy to be scheduled.

## 2012-10-02 NOTE — Progress Notes (Signed)
Presenting complaint;  Patient complains of constipation and interested in colonoscopy but has multiple questions.  History of present illness;  Patient is 67 year old Caucasian male who is referred through courtesy of Dr. Juanetta Gosling for GI evaluation. Dr. Juanetta Gosling recommended colonoscopy for screening purposes. He has never been screened for CRC before. He states he is more anxious and nervous about getting colonoscopy and he was prior to getting abdominal aortic aneurysm fixed. He wants to know risk of bleeding, perforation and miss rates. He also wants to make sure that he is comfortable doing procedure. He complains of constipation. He tried MiraLax but it did not help. He is eating fiber rich foods in fiber cereals. He can still go 5-6 days without a bowel movement at which time he takes ex-lax. He denies melena rectal bleeding or abdominal pain. His heartburn is well controlled with therapy. He denies nausea vomiting or dysphagia. He does complain of intermittent soreness secondary to hemorrhoids. He has slight difficulty ambulation since he was diagnosed with Parkinson disease. His tremors are well controlled with therapy.  Current Medications: Current Outpatient Prescriptions  Medication Sig Dispense Refill  . acetaminophen (TYLENOL) 325 MG tablet Take 650 mg by mouth as needed for pain.      Marland Kitchen allopurinol (ZYLOPRIM) 300 MG tablet Take 300 mg by mouth daily.      Marland Kitchen ALPRAZolam (XANAX) 1 MG tablet Take by mouth. Patient states that he takes 1/2 tablet in the morning and 1/2 tablet at bedtime.      Marland Kitchen amLODipine (NORVASC) 10 MG tablet Take 10 mg by mouth daily.      . carbidopa-levodopa (SINEMET IR) 25-100 MG per tablet Take 1 tablet by mouth 2 (two) times daily.      . Melatonin 1 MG TABS Take by mouth as needed.      Marland Kitchen omeprazole (PRILOSEC) 20 MG capsule Take 20 mg by mouth daily.      . ondansetron (ZOFRAN) 4 MG tablet Take 4 mg by mouth as needed for nausea.       No current  facility-administered medications for this visit.   Past medical history; Hypertension of over 20 years duration. Right knee arthroscopy several years ago. Gout. Chronic GERD. Symptom duration 6 years. EGD in generally 2012 because of melena and had antral scars. Status post therapy for H. pylori gastritis. Anxiety of several years duration. Parkinson's disease was diagnosed in 2007. AAA repair in April 2007. Umbilicus hernia repaired at the time of AAA repair. 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 Status post appendectomy in early 1990.s Cholecystectomy in May 2011 He has 2 ventral or abdominal hernias. Allergies; Allergies  Allergen Reactions  . Morphine And Related    Family history; Father had multiple medical problems and died at 91 because of renal failure. Mother died of brain tumor at 56. One brother died in auto accident at age 74. He has a brother and a sister living. Social history; He is divorced. He has 3 daughters one of whom is an Charity fundraiser and she works at [employer]. He is a retired Personnel officer. He smokes about a pack a day which she's done for 45 years. He does not drink alcohol.  Objective: Blood pressure 120/78, pulse 80, temperature 97.2 F (36.2 C), temperature source Oral, resp. rate 20, height 5\' 8"  (1.727 m), weight 174 lb 6.4 oz (79.107 kg). Patient is alert and in no acute distress. Conjunctiva is pink. Sclera is nonicteric Oropharyngeal mucosa is normal.  No neck masses or thyromegaly noted. Cardiac exam with regular rhythm normal S1 and S2. No murmur or gallop noted. Lungs are clear to auscultation. Abdomen is full. He has upper midline scar. He has 2 herniae. He has some from glycol and epigastric hernia. Abdomen is soft and nontender without organomegaly or masses. No LE edema or clubbing noted.    Assessment:  #1 Chronic constipation possibly secondary to medications or colonic motility disorder.  He does not have a lot of symptoms. #2. Colorectal screening. He has never been screened for CRC. Personal history significant for renal cell carcinoma and he remains in remission. We discussed potential complications of this procedure but over lower risk of post-polypectomy bleed or perforation is very small. Similarly miss rate for significant lesions in our unit is quite low. All questions answered to patient's satisfaction and he is encouraged to call me if he has another questions later on.   Recommendations;  Mycolog 2 cream to be used as directed. Colace 200 mg by mouth each bedtime. Screening colonoscopy be performed within the next few weeks.

## 2012-10-03 DIAGNOSIS — K219 Gastro-esophageal reflux disease without esophagitis: Secondary | ICD-10-CM | POA: Insufficient documentation

## 2012-10-10 ENCOUNTER — Encounter (INDEPENDENT_AMBULATORY_CARE_PROVIDER_SITE_OTHER): Payer: Self-pay

## 2012-10-31 ENCOUNTER — Encounter (INDEPENDENT_AMBULATORY_CARE_PROVIDER_SITE_OTHER): Payer: Self-pay | Admitting: *Deleted

## 2012-10-31 DIAGNOSIS — M7750 Other enthesopathy of unspecified foot: Secondary | ICD-10-CM | POA: Diagnosis not present

## 2012-10-31 DIAGNOSIS — Q828 Other specified congenital malformations of skin: Secondary | ICD-10-CM | POA: Diagnosis not present

## 2012-10-31 DIAGNOSIS — M79609 Pain in unspecified limb: Secondary | ICD-10-CM | POA: Diagnosis not present

## 2012-11-15 ENCOUNTER — Encounter (HOSPITAL_COMMUNITY): Payer: Self-pay | Admitting: Pharmacy Technician

## 2012-11-29 ENCOUNTER — Ambulatory Visit (HOSPITAL_COMMUNITY)
Admission: RE | Admit: 2012-11-29 | Discharge: 2012-11-29 | Disposition: A | Discharge status: Home / Self Care | Payer: Medicare Other | Source: Ambulatory Visit | Attending: Internal Medicine | Admitting: Internal Medicine

## 2012-11-29 ENCOUNTER — Encounter (HOSPITAL_COMMUNITY)
Admission: RE | Disposition: A | Discharge status: Home / Self Care | Payer: Self-pay | Source: Ambulatory Visit | Attending: Internal Medicine

## 2012-11-29 ENCOUNTER — Encounter (HOSPITAL_COMMUNITY): Payer: Self-pay

## 2012-11-29 DIAGNOSIS — I719 Aortic aneurysm of unspecified site, without rupture: Secondary | ICD-10-CM | POA: Insufficient documentation

## 2012-11-29 DIAGNOSIS — K5730 Diverticulosis of large intestine without perforation or abscess without bleeding: Secondary | ICD-10-CM | POA: Insufficient documentation

## 2012-11-29 DIAGNOSIS — K219 Gastro-esophageal reflux disease without esophagitis: Secondary | ICD-10-CM | POA: Insufficient documentation

## 2012-11-29 DIAGNOSIS — G20 Parkinson's disease: Secondary | ICD-10-CM | POA: Diagnosis not present

## 2012-11-29 DIAGNOSIS — I10 Essential (primary) hypertension: Secondary | ICD-10-CM | POA: Diagnosis not present

## 2012-11-29 DIAGNOSIS — F411 Generalized anxiety disorder: Secondary | ICD-10-CM | POA: Diagnosis not present

## 2012-11-29 DIAGNOSIS — Z79899 Other long term (current) drug therapy: Secondary | ICD-10-CM | POA: Diagnosis not present

## 2012-11-29 DIAGNOSIS — M109 Gout, unspecified: Secondary | ICD-10-CM | POA: Insufficient documentation

## 2012-11-29 DIAGNOSIS — F17200 Nicotine dependence, unspecified, uncomplicated: Secondary | ICD-10-CM | POA: Diagnosis not present

## 2012-11-29 DIAGNOSIS — Z1211 Encounter for screening for malignant neoplasm of colon: Secondary | ICD-10-CM | POA: Diagnosis not present

## 2012-11-29 DIAGNOSIS — Z85528 Personal history of other malignant neoplasm of kidney: Secondary | ICD-10-CM | POA: Insufficient documentation

## 2012-11-29 DIAGNOSIS — D126 Benign neoplasm of colon, unspecified: Secondary | ICD-10-CM | POA: Diagnosis not present

## 2012-11-29 DIAGNOSIS — Z885 Allergy status to narcotic agent status: Secondary | ICD-10-CM | POA: Diagnosis not present

## 2012-11-29 DIAGNOSIS — K5900 Constipation, unspecified: Secondary | ICD-10-CM | POA: Diagnosis not present

## 2012-11-29 DIAGNOSIS — G20A1 Parkinson's disease without dyskinesia, without mention of fluctuations: Secondary | ICD-10-CM | POA: Insufficient documentation

## 2012-11-29 HISTORY — PX: COLONOSCOPY: SHX5424

## 2012-11-29 SURGERY — COLONOSCOPY
Anesthesia: Moderate Sedation

## 2012-11-29 MED ORDER — MIDAZOLAM HCL 5 MG/5ML IJ SOLN
INTRAMUSCULAR | Status: DC
Start: 2012-11-29 — End: 2012-11-29
  Filled 2012-11-29: qty 10

## 2012-11-29 MED ORDER — MIDAZOLAM HCL 5 MG/5ML IJ SOLN
INTRAMUSCULAR | Status: AC
  Filled 2012-11-29: qty 10

## 2012-11-29 MED ORDER — SODIUM CHLORIDE 0.9 % IJ SOLN
INTRAMUSCULAR | Status: AC
  Filled 2012-11-29: qty 10

## 2012-11-29 MED ORDER — MEPERIDINE HCL 50 MG/ML IJ SOLN
INTRAMUSCULAR | Status: DC | PRN
  Administered 2012-11-29 (×2): 25 mg via INTRAVENOUS

## 2012-11-29 MED ORDER — MIDAZOLAM HCL 5 MG/5ML IJ SOLN
INTRAMUSCULAR | Status: DC | PRN
  Administered 2012-11-29: 2 mg via INTRAVENOUS
  Administered 2012-11-29: 3 mg via INTRAVENOUS
  Administered 2012-11-29 (×3): 2 mg via INTRAVENOUS

## 2012-11-29 MED ORDER — PROMETHAZINE HCL 25 MG/ML IJ SOLN
INTRAMUSCULAR | Status: AC
  Filled 2012-11-29: qty 1

## 2012-11-29 MED ORDER — PROMETHAZINE HCL 25 MG/ML IJ SOLN
INTRAMUSCULAR | Status: DC | PRN
  Administered 2012-11-29: 12.5 mg via INTRAVENOUS

## 2012-11-29 MED ORDER — SIMETHICONE 40 MG/0.6ML PO SUSP
ORAL | Status: DC | PRN
  Administered 2012-11-29: 10:00:00

## 2012-11-29 MED ORDER — MEPERIDINE HCL 50 MG/ML IJ SOLN
INTRAMUSCULAR | Status: AC
  Filled 2012-11-29: qty 1

## 2012-11-29 MED ORDER — SODIUM CHLORIDE 0.9 % IV SOLN
INTRAVENOUS | Status: DC
  Administered 2012-11-29: 09:00:00 via INTRAVENOUS

## 2012-11-29 NOTE — H&P (Signed)
Brett Russell is an 67 y.o. male.   Chief Complaint: Patient's here for colonoscopy. HPI: Patient is 67 year old Caucasian male who is here for screening colonoscopy. He denies abdominal pain change in bowel habits. He has chronic constipation. He did experience hematochezia after he took yesterday and some today. Family history is negative for colorectal carcinoma.  Past Medical History  Diagnosis Date  . GERD (gastroesophageal reflux disease)   . Parkinson disease 2006  . Aortic aneurysm 2007  . Renal cancer 09/2005    Left Kidney  . Hypertension   . Anxiety   . Gout     Past Surgical History  Procedure Laterality Date  . Cholecystectomy    . Abdominal aortic aneurysm repair  2007  . Kidney surgery      Tumor removed  . Right knee      Cartilage removed  . Appendectomy      Family History  Problem Relation Age of Onset  . Kidney disease Father   . Healthy Brother   . Healthy Daughter   . Constipation Daughter   . Healthy Daughter    Social History:  reports that he has been smoking Cigarettes.  He has been smoking about 0.00 packs per day. He has never used smokeless tobacco. He reports that he does not drink alcohol. His drug history is not on file.  Allergies:  Allergies  Allergen Reactions  . Morphine And Related     Medications Prior to Admission  Medication Sig Dispense Refill  . acetaminophen (TYLENOL) 325 MG tablet Take 650 mg by mouth as needed for pain.      Marland Kitchen allopurinol (ZYLOPRIM) 300 MG tablet Take 300 mg by mouth daily.      Marland Kitchen ALPRAZolam (XANAX) 1 MG tablet Take 0.5-1 mg by mouth 2 (two) times daily. 1 in morning and 0.5 at bedtime.      Marland Kitchen amLODipine (NORVASC) 10 MG tablet Take 10 mg by mouth daily.      . carbidopa-levodopa (SINEMET IR) 25-100 MG per tablet Take 1 tablet by mouth 2 (two) times daily.      Marland Kitchen docusate sodium (COLACE) 100 MG capsule Take 2 capsules (200 mg total) by mouth at bedtime.  1 capsule  0  . Melatonin 1 MG TABS Take 1 tablet  by mouth at bedtime as needed (sleep).       Marland Kitchen omeprazole (PRILOSEC) 20 MG capsule Take 20 mg by mouth daily.      . ondansetron (ZOFRAN) 4 MG tablet Take 4 mg by mouth as needed for nausea.        No results found for this or any previous visit (from the past 48 hour(s)). No results found.  ROS  Blood pressure 134/82, temperature 97.4 F (36.3 C), temperature source Oral, resp. rate 22, height 5' 8.5" (1.74 m), weight 168 lb (76.204 kg), SpO2 95.00%. Physical Exam  Constitutional: He appears well-developed and well-nourished.  HENT:  Mouth/Throat: Oropharynx is clear and moist.  Eyes: Conjunctivae are normal. No scleral icterus.  Neck: No thyromegaly present.  Cardiovascular: Normal rate, regular rhythm and normal heart sounds.   No murmur heard. Respiratory: Effort normal and breath sounds normal.  GI: Soft. He exhibits no distension and no mass. There is no tenderness.  Musculoskeletal: He exhibits no edema.  Lymphadenopathy:    He has no cervical adenopathy.  Neurological: He is alert.  Skin: Skin is warm and dry.     Assessment/Plan Average risk screening colonoscopy.  REHMAN,NAJEEB U  11/29/2012, 9:59 AM

## 2012-11-29 NOTE — Op Note (Signed)
COLONOSCOPY PROCEDURE REPORT  PATIENT:  Brett Russell  MR#:  [id number] Birthdate:  1945-10-11, 67 y.o., male Endoscopist:  Dr. Malissa Hippo, MD Referred By:  Dr. Oneal Deputy. Juanetta Gosling, MD  Procedure Date: 11/29/2012  Procedure:   Colonoscopy with snare polypectomy.  Indications:  Patient is 67 year old Caucasian male who is undergoing average risk screening colonoscopy. This is patient's first exam.  Informed Consent:  The procedure and risks were reviewed with the patient and informed consent was obtained.  Medications:  Demerol 50 mg IV Versed 13 mg IV Promethazine 12.5 mg IV and diluted form.  Description of procedure:  After a digital rectal exam was performed, that colonoscope was advanced from the anus through the rectum and colon to the area of the cecum, ileocecal valve and appendiceal orifice. The cecum was deeply intubated. These structures were well-seen and photographed for the record. From the level of the cecum and ileocecal valve, the scope was slowly and cautiously withdrawn. The mucosal surfaces were carefully surveyed utilizing scope tip to flexion to facilitate fold flattening as needed. The scope was pulled down into the rectum where a thorough exam including retroflexion was performed.  Findings:   Prep satisfactory. Two small polyps removed from transverse colon and submitted together. One removed via cold biopsy and the second one was cold snared. 8mm ulcerated polyp snared from the distal sigmoid colon. Few small diverticula at sigmoid colon. Normal rectal mucosa and anal rectal junction.   Therapeutic/Diagnostic Maneuvers Performed:  See above  Complications:  None  Cecal Withdrawal Time:  23 minutes  Impression:  Examination performed to cecum. Two small polyps removed from transverse colon and submitted together(cold biopsy and cold snared). 8mm ulcerated polyp snared from distal sigmoid colon. Mild sigmoid colon diverticulosis.  Recommendations:   Standard instructions given. I will contact patient with biopsy results and further recommendations.  Concepcion Gillott U  11/29/2012 10:45 AM  CC: Dr. Fredirick Maudlin, MD & Dr. Bonnetta Barry ref. provider found

## 2012-12-01 ENCOUNTER — Encounter (HOSPITAL_COMMUNITY): Payer: Self-pay | Admitting: Internal Medicine

## 2012-12-05 DIAGNOSIS — Z0000 Encounter for general adult medical examination without abnormal findings: Secondary | ICD-10-CM | POA: Diagnosis not present

## 2012-12-06 DIAGNOSIS — Z0000 Encounter for general adult medical examination without abnormal findings: Secondary | ICD-10-CM | POA: Diagnosis not present

## 2012-12-06 DIAGNOSIS — Z125 Encounter for screening for malignant neoplasm of prostate: Secondary | ICD-10-CM | POA: Diagnosis not present

## 2012-12-06 DIAGNOSIS — M109 Gout, unspecified: Secondary | ICD-10-CM | POA: Diagnosis not present

## 2012-12-12 ENCOUNTER — Encounter (INDEPENDENT_AMBULATORY_CARE_PROVIDER_SITE_OTHER): Payer: Self-pay | Admitting: *Deleted

## 2012-12-26 DIAGNOSIS — Q828 Other specified congenital malformations of skin: Secondary | ICD-10-CM | POA: Diagnosis not present

## 2012-12-26 DIAGNOSIS — M79609 Pain in unspecified limb: Secondary | ICD-10-CM | POA: Diagnosis not present

## 2012-12-26 DIAGNOSIS — M7750 Other enthesopathy of unspecified foot: Secondary | ICD-10-CM | POA: Diagnosis not present

## 2013-02-20 DIAGNOSIS — Q828 Other specified congenital malformations of skin: Secondary | ICD-10-CM | POA: Diagnosis not present

## 2013-02-20 DIAGNOSIS — M7750 Other enthesopathy of unspecified foot: Secondary | ICD-10-CM | POA: Diagnosis not present

## 2013-02-20 DIAGNOSIS — M79609 Pain in unspecified limb: Secondary | ICD-10-CM | POA: Diagnosis not present

## 2013-02-21 ENCOUNTER — Other Ambulatory Visit: Payer: Self-pay

## 2013-03-22 DIAGNOSIS — H35059 Retinal neovascularization, unspecified, unspecified eye: Secondary | ICD-10-CM | POA: Diagnosis not present

## 2013-03-22 DIAGNOSIS — H179 Unspecified corneal scar and opacity: Secondary | ICD-10-CM | POA: Diagnosis not present

## 2013-03-22 DIAGNOSIS — H2589 Other age-related cataract: Secondary | ICD-10-CM | POA: Diagnosis not present

## 2013-03-22 DIAGNOSIS — H35359 Cystoid macular degeneration, unspecified eye: Secondary | ICD-10-CM | POA: Diagnosis not present

## 2013-03-22 DIAGNOSIS — H5200 Hypermetropia, unspecified eye: Secondary | ICD-10-CM | POA: Diagnosis not present

## 2013-04-10 DIAGNOSIS — J309 Allergic rhinitis, unspecified: Secondary | ICD-10-CM | POA: Diagnosis not present

## 2013-04-10 DIAGNOSIS — J449 Chronic obstructive pulmonary disease, unspecified: Secondary | ICD-10-CM | POA: Diagnosis not present

## 2013-04-10 DIAGNOSIS — J019 Acute sinusitis, unspecified: Secondary | ICD-10-CM | POA: Diagnosis not present

## 2013-04-12 DIAGNOSIS — H35059 Retinal neovascularization, unspecified, unspecified eye: Secondary | ICD-10-CM | POA: Diagnosis not present

## 2013-04-17 DIAGNOSIS — Q842 Other congenital malformations of hair: Secondary | ICD-10-CM | POA: Diagnosis not present

## 2013-05-10 DIAGNOSIS — H353290 Exudative age-related macular degeneration, unspecified eye, stage unspecified: Secondary | ICD-10-CM | POA: Diagnosis not present

## 2013-05-24 ENCOUNTER — Other Ambulatory Visit: Payer: Self-pay

## 2013-06-12 DIAGNOSIS — E1149 Type 2 diabetes mellitus with other diabetic neurological complication: Secondary | ICD-10-CM | POA: Diagnosis not present

## 2013-06-12 DIAGNOSIS — L851 Acquired keratosis [keratoderma] palmaris et plantaris: Secondary | ICD-10-CM | POA: Diagnosis not present

## 2013-06-19 DIAGNOSIS — H353290 Exudative age-related macular degeneration, unspecified eye, stage unspecified: Secondary | ICD-10-CM | POA: Diagnosis not present

## 2013-07-17 DIAGNOSIS — H353290 Exudative age-related macular degeneration, unspecified eye, stage unspecified: Secondary | ICD-10-CM | POA: Diagnosis not present

## 2013-08-07 DIAGNOSIS — M79609 Pain in unspecified limb: Secondary | ICD-10-CM | POA: Diagnosis not present

## 2013-08-07 DIAGNOSIS — Q828 Other specified congenital malformations of skin: Secondary | ICD-10-CM | POA: Diagnosis not present

## 2013-08-09 ENCOUNTER — Other Ambulatory Visit (HOSPITAL_COMMUNITY): Payer: Self-pay | Admitting: Pulmonary Disease

## 2013-08-09 DIAGNOSIS — I714 Abdominal aortic aneurysm, without rupture, unspecified: Secondary | ICD-10-CM

## 2013-08-09 DIAGNOSIS — E785 Hyperlipidemia, unspecified: Secondary | ICD-10-CM | POA: Diagnosis not present

## 2013-08-09 DIAGNOSIS — I10 Essential (primary) hypertension: Secondary | ICD-10-CM | POA: Diagnosis not present

## 2013-08-09 DIAGNOSIS — M1990 Unspecified osteoarthritis, unspecified site: Secondary | ICD-10-CM | POA: Diagnosis not present

## 2013-08-09 DIAGNOSIS — K2100 Gastro-esophageal reflux disease with esophagitis, without bleeding: Secondary | ICD-10-CM | POA: Diagnosis not present

## 2013-08-14 DIAGNOSIS — H353290 Exudative age-related macular degeneration, unspecified eye, stage unspecified: Secondary | ICD-10-CM | POA: Diagnosis not present

## 2013-08-15 ENCOUNTER — Ambulatory Visit (HOSPITAL_COMMUNITY)
Admission: RE | Admit: 2013-08-15 | Discharge: 2013-08-15 | Disposition: A | Discharge status: Home / Self Care | Payer: Medicare Other | Source: Ambulatory Visit | Attending: Pulmonary Disease | Admitting: Pulmonary Disease

## 2013-08-15 DIAGNOSIS — I723 Aneurysm of iliac artery: Secondary | ICD-10-CM | POA: Diagnosis not present

## 2013-08-15 DIAGNOSIS — I714 Abdominal aortic aneurysm, without rupture, unspecified: Secondary | ICD-10-CM

## 2013-08-15 DIAGNOSIS — Z09 Encounter for follow-up examination after completed treatment for conditions other than malignant neoplasm: Secondary | ICD-10-CM | POA: Insufficient documentation

## 2013-08-21 ENCOUNTER — Other Ambulatory Visit (HOSPITAL_COMMUNITY): Payer: Self-pay | Admitting: Pulmonary Disease

## 2013-08-21 DIAGNOSIS — I714 Abdominal aortic aneurysm, without rupture, unspecified: Secondary | ICD-10-CM

## 2013-08-23 ENCOUNTER — Ambulatory Visit (HOSPITAL_COMMUNITY)
Admission: RE | Admit: 2013-08-23 | Discharge: 2013-08-23 | Disposition: A | Discharge status: Home / Self Care | Payer: Medicare Other | Source: Ambulatory Visit | Attending: Pulmonary Disease | Admitting: Pulmonary Disease

## 2013-08-23 DIAGNOSIS — I7789 Other specified disorders of arteries and arterioles: Secondary | ICD-10-CM | POA: Diagnosis not present

## 2013-08-23 DIAGNOSIS — Z09 Encounter for follow-up examination after completed treatment for conditions other than malignant neoplasm: Secondary | ICD-10-CM | POA: Diagnosis not present

## 2013-08-23 DIAGNOSIS — M6200 Separation of muscle (nontraumatic), unspecified site: Secondary | ICD-10-CM | POA: Diagnosis not present

## 2013-08-23 DIAGNOSIS — I714 Abdominal aortic aneurysm, without rupture, unspecified: Secondary | ICD-10-CM

## 2013-08-23 DIAGNOSIS — K5730 Diverticulosis of large intestine without perforation or abscess without bleeding: Secondary | ICD-10-CM | POA: Diagnosis not present

## 2013-08-23 LAB — POCT I-STAT CREATININE: Creatinine, Ser: 0.9 mg/dL (ref 0.50–1.35)

## 2013-08-23 MED ORDER — IOHEXOL 350 MG/ML SOLN
100.0000 mL | Freq: Once | INTRAVENOUS | Status: AC | PRN
  Administered 2013-08-23: 100 mL via INTRAVENOUS

## 2013-09-11 ENCOUNTER — Encounter: Payer: Medicare Other | Admitting: Vascular Surgery

## 2013-09-17 ENCOUNTER — Encounter: Payer: Self-pay | Admitting: Vascular Surgery

## 2013-09-18 ENCOUNTER — Encounter: Payer: Self-pay | Admitting: Vascular Surgery

## 2013-09-18 ENCOUNTER — Ambulatory Visit (INDEPENDENT_AMBULATORY_CARE_PROVIDER_SITE_OTHER): Payer: Medicare Other | Admitting: Vascular Surgery

## 2013-09-18 VITALS — BP 144/85 | HR 102 | Resp 16 | Ht 68.5 in | Wt 170.0 lb

## 2013-09-18 DIAGNOSIS — I723 Aneurysm of iliac artery: Secondary | ICD-10-CM | POA: Diagnosis not present

## 2013-09-18 DIAGNOSIS — I714 Abdominal aortic aneurysm, without rupture, unspecified: Secondary | ICD-10-CM

## 2013-09-18 DIAGNOSIS — Z48812 Encounter for surgical aftercare following surgery on the circulatory system: Secondary | ICD-10-CM

## 2013-09-18 NOTE — Progress Notes (Signed)
Subjective:     Patient ID: Brett Russell, male   DOB: 03-21-1946, 68 y.o.   MRN: [id number]  HPI this 68 year old male is referred by Dr. 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 Patient had resection and grafting of infrarenal abdominal aortic aneurysm by me in 2007 with insertion of the tube graft. Patient has done well since that time. He was also found to have a left renal cell carcinoma and required partial resection of the left kidney and has done well from that standpoint. 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  Past Medical History  Diagnosis Date  . GERD (gastroesophageal reflux disease)   . Parkinson disease 2006  . Aortic aneurysm 2007  . Renal cancer 09/2005    Left Kidney  . Hypertension   . Anxiety   . Gout     History  Substance Use Topics  . Smoking status: Current Every Day Smoker    Types: Cigarettes  . Smokeless tobacco: Never Used     Comment: Patient states that he smoke 1ppd,he has smoked since age 29  . Alcohol Use: No    Family History  Problem Relation Age of Onset  . Kidney disease Father   . Healthy Brother   . Healthy Daughter   . Constipation Daughter   . Healthy Daughter     Allergies  Allergen Reactions  . Morphine And Related     Current outpatient prescriptions:acetaminophen (TYLENOL) 325 MG tablet, Take 650 mg by mouth as needed for pain., Disp: , Rfl: ;  allopurinol (ZYLOPRIM) 300 MG tablet, Take 300 mg by mouth daily., Disp: , Rfl: ;  ALPRAZolam (XANAX) 1 MG tablet, Take 0.5-1 mg by mouth 2 (two) times daily. 1 in morning and 0.5 at bedtime., Disp: , Rfl: ;  amLODipine (NORVASC) 10 MG tablet, Take 10 mg by mouth daily., Disp: , Rfl:  carbidopa-levodopa (SINEMET IR) 25-100 MG per tablet, Take  1 tablet by mouth 2 (two) times daily., Disp: , Rfl: ;  docusate sodium (COLACE) 100 MG capsule, Take 2 capsules (200 mg total) by mouth at bedtime., Disp: 1 capsule, Rfl: 0;  Melatonin 1 MG TABS, Take 1 tablet by mouth at bedtime as needed (sleep). , Disp: , Rfl: ;  omeprazole (PRILOSEC) 20 MG capsule, Take 20 mg by mouth daily., Disp: , Rfl:  ondansetron (ZOFRAN) 4 MG tablet, Take 4 mg by mouth as needed for nausea., Disp: , Rfl:   BP 144/85  Pulse 102  Resp 16  Ht 5' 8.5" (1.74 m)  Wt 170 lb (77.111 kg)  BMI 25.47 kg/m2  Body mass index is 25.47 kg/(m^2).           Review of Systems denies chest pain, dyspnea on exertion, PND, orthopnea. Gait is slightly limited because of Parkinson's. Had polio as a child but has done well with no generalized weakness. No abdominal discomfort or claudication or hemoptysis. Other systems negative and a complete review of system    Objective:   Physical Exam BP 144/85  Pulse 102  Resp 16  Ht 5' 8.5" (1.74 m)  Wt 170 lb (77.111 kg)  BMI 25.47 kg/m2  Gen.-alert and oriented x3 in no apparent distress HEENT normal for age Lungs no rhonchi  or wheezing Cardiovascular regular rhythm no murmurs carotid pulses 3+ palpable no bruits audible Abdomen soft nontender no palpable masses-midline incision well-healed-no pulsatile mass  Musculoskeletal free of  major deformities Skin clear -no rashes Neurologic normal Lower extremities 3+ femoral and dorsalis pedis pulses palpable bilaterally with no edema  Today I reviewed the CT scan which was performed recently which revealed that the left iliac artery was dilated up to 2.7 cm in maximum diameter. Previously it had been 1.9 cm a few years back. 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 Does not need treatment at this time but we will follow this on regular basis

## 2013-09-18 NOTE — Addendum Note (Signed)
Addended by: Mena Goes on: 09/18/2013 03:21 PM   Modules accepted: Orders

## 2013-09-20 DIAGNOSIS — H353290 Exudative age-related macular degeneration, unspecified eye, stage unspecified: Secondary | ICD-10-CM | POA: Diagnosis not present

## 2013-10-02 DIAGNOSIS — Q828 Other specified congenital malformations of skin: Secondary | ICD-10-CM | POA: Diagnosis not present

## 2013-10-25 DIAGNOSIS — H353290 Exudative age-related macular degeneration, unspecified eye, stage unspecified: Secondary | ICD-10-CM | POA: Diagnosis not present

## 2013-11-13 DIAGNOSIS — Q828 Other specified congenital malformations of skin: Secondary | ICD-10-CM | POA: Diagnosis not present

## 2013-11-29 DIAGNOSIS — H353290 Exudative age-related macular degeneration, unspecified eye, stage unspecified: Secondary | ICD-10-CM | POA: Diagnosis not present

## 2013-12-06 DIAGNOSIS — E785 Hyperlipidemia, unspecified: Secondary | ICD-10-CM | POA: Diagnosis not present

## 2013-12-06 DIAGNOSIS — J449 Chronic obstructive pulmonary disease, unspecified: Secondary | ICD-10-CM | POA: Diagnosis not present

## 2013-12-06 DIAGNOSIS — M545 Low back pain, unspecified: Secondary | ICD-10-CM | POA: Diagnosis not present

## 2013-12-06 DIAGNOSIS — I10 Essential (primary) hypertension: Secondary | ICD-10-CM | POA: Diagnosis not present

## 2013-12-06 DIAGNOSIS — Z23 Encounter for immunization: Secondary | ICD-10-CM | POA: Diagnosis not present

## 2013-12-19 DIAGNOSIS — J449 Chronic obstructive pulmonary disease, unspecified: Secondary | ICD-10-CM | POA: Diagnosis not present

## 2013-12-19 DIAGNOSIS — Z125 Encounter for screening for malignant neoplasm of prostate: Secondary | ICD-10-CM | POA: Diagnosis not present

## 2013-12-19 DIAGNOSIS — E785 Hyperlipidemia, unspecified: Secondary | ICD-10-CM | POA: Diagnosis not present

## 2013-12-19 DIAGNOSIS — I10 Essential (primary) hypertension: Secondary | ICD-10-CM | POA: Diagnosis not present

## 2013-12-19 DIAGNOSIS — Z79899 Other long term (current) drug therapy: Secondary | ICD-10-CM | POA: Diagnosis not present

## 2013-12-25 DIAGNOSIS — Q828 Other specified congenital malformations of skin: Secondary | ICD-10-CM | POA: Diagnosis not present

## 2013-12-25 DIAGNOSIS — M79609 Pain in unspecified limb: Secondary | ICD-10-CM | POA: Diagnosis not present

## 2014-01-03 DIAGNOSIS — H353290 Exudative age-related macular degeneration, unspecified eye, stage unspecified: Secondary | ICD-10-CM | POA: Diagnosis not present

## 2014-02-05 DIAGNOSIS — Q828 Other specified congenital malformations of skin: Secondary | ICD-10-CM | POA: Diagnosis not present

## 2014-02-05 DIAGNOSIS — M79609 Pain in unspecified limb: Secondary | ICD-10-CM | POA: Diagnosis not present

## 2014-02-11 DIAGNOSIS — H353290 Exudative age-related macular degeneration, unspecified eye, stage unspecified: Secondary | ICD-10-CM | POA: Diagnosis not present

## 2014-03-19 DIAGNOSIS — Q828 Other specified congenital malformations of skin: Secondary | ICD-10-CM | POA: Diagnosis not present

## 2014-03-19 DIAGNOSIS — M79609 Pain in unspecified limb: Secondary | ICD-10-CM | POA: Diagnosis not present

## 2014-03-26 DIAGNOSIS — H353290 Exudative age-related macular degeneration, unspecified eye, stage unspecified: Secondary | ICD-10-CM | POA: Diagnosis not present

## 2014-05-07 DIAGNOSIS — H3531 Nonexudative age-related macular degeneration: Secondary | ICD-10-CM | POA: Diagnosis not present

## 2014-05-07 DIAGNOSIS — H43811 Vitreous degeneration, right eye: Secondary | ICD-10-CM | POA: Diagnosis not present

## 2014-05-07 DIAGNOSIS — H3532 Exudative age-related macular degeneration: Secondary | ICD-10-CM | POA: Diagnosis not present

## 2014-05-21 DIAGNOSIS — M79673 Pain in unspecified foot: Secondary | ICD-10-CM | POA: Diagnosis not present

## 2014-05-21 DIAGNOSIS — Q828 Other specified congenital malformations of skin: Secondary | ICD-10-CM | POA: Diagnosis not present

## 2014-06-07 DIAGNOSIS — K210 Gastro-esophageal reflux disease with esophagitis: Secondary | ICD-10-CM | POA: Diagnosis not present

## 2014-06-07 DIAGNOSIS — I10 Essential (primary) hypertension: Secondary | ICD-10-CM | POA: Diagnosis not present

## 2014-06-07 DIAGNOSIS — G20 Parkinson's disease: Secondary | ICD-10-CM | POA: Diagnosis not present

## 2014-06-07 DIAGNOSIS — J449 Chronic obstructive pulmonary disease, unspecified: Secondary | ICD-10-CM | POA: Diagnosis not present

## 2014-07-02 DIAGNOSIS — M79673 Pain in unspecified foot: Secondary | ICD-10-CM | POA: Diagnosis not present

## 2014-07-02 DIAGNOSIS — Q828 Other specified congenital malformations of skin: Secondary | ICD-10-CM | POA: Diagnosis not present

## 2014-07-16 DIAGNOSIS — H3532 Exudative age-related macular degeneration: Secondary | ICD-10-CM | POA: Diagnosis not present

## 2014-08-12 DIAGNOSIS — H5213 Myopia, bilateral: Secondary | ICD-10-CM | POA: Diagnosis not present

## 2014-08-12 DIAGNOSIS — H3532 Exudative age-related macular degeneration: Secondary | ICD-10-CM | POA: Diagnosis not present

## 2014-08-12 DIAGNOSIS — H2513 Age-related nuclear cataract, bilateral: Secondary | ICD-10-CM | POA: Diagnosis not present

## 2014-08-13 DIAGNOSIS — Q828 Other specified congenital malformations of skin: Secondary | ICD-10-CM | POA: Diagnosis not present

## 2014-08-13 DIAGNOSIS — M79673 Pain in unspecified foot: Secondary | ICD-10-CM | POA: Diagnosis not present

## 2014-09-12 DIAGNOSIS — H2511 Age-related nuclear cataract, right eye: Secondary | ICD-10-CM | POA: Diagnosis not present

## 2014-09-12 DIAGNOSIS — H25041 Posterior subcapsular polar age-related cataract, right eye: Secondary | ICD-10-CM | POA: Diagnosis not present

## 2014-09-12 DIAGNOSIS — H25811 Combined forms of age-related cataract, right eye: Secondary | ICD-10-CM | POA: Diagnosis not present

## 2014-09-19 DIAGNOSIS — H3532 Exudative age-related macular degeneration: Secondary | ICD-10-CM | POA: Diagnosis not present

## 2014-10-22 DIAGNOSIS — Q828 Other specified congenital malformations of skin: Secondary | ICD-10-CM | POA: Diagnosis not present

## 2014-10-22 DIAGNOSIS — M79673 Pain in unspecified foot: Secondary | ICD-10-CM | POA: Diagnosis not present

## 2014-10-24 DIAGNOSIS — H3532 Exudative age-related macular degeneration: Secondary | ICD-10-CM | POA: Diagnosis not present

## 2014-10-31 DIAGNOSIS — H25812 Combined forms of age-related cataract, left eye: Secondary | ICD-10-CM | POA: Diagnosis not present

## 2014-10-31 DIAGNOSIS — H25042 Posterior subcapsular polar age-related cataract, left eye: Secondary | ICD-10-CM | POA: Diagnosis not present

## 2014-10-31 DIAGNOSIS — H2512 Age-related nuclear cataract, left eye: Secondary | ICD-10-CM | POA: Diagnosis not present

## 2014-12-03 DIAGNOSIS — Q828 Other specified congenital malformations of skin: Secondary | ICD-10-CM | POA: Diagnosis not present

## 2014-12-03 DIAGNOSIS — M79673 Pain in unspecified foot: Secondary | ICD-10-CM | POA: Diagnosis not present

## 2014-12-05 DIAGNOSIS — H3532 Exudative age-related macular degeneration: Secondary | ICD-10-CM | POA: Diagnosis not present

## 2014-12-10 ENCOUNTER — Encounter: Payer: Self-pay | Admitting: Acute Care

## 2014-12-10 DIAGNOSIS — G20 Parkinson's disease: Secondary | ICD-10-CM | POA: Diagnosis not present

## 2014-12-10 DIAGNOSIS — I10 Essential (primary) hypertension: Secondary | ICD-10-CM | POA: Diagnosis not present

## 2014-12-10 DIAGNOSIS — H3530 Unspecified macular degeneration: Secondary | ICD-10-CM | POA: Diagnosis not present

## 2014-12-10 DIAGNOSIS — J449 Chronic obstructive pulmonary disease, unspecified: Secondary | ICD-10-CM | POA: Diagnosis not present

## 2014-12-17 ENCOUNTER — Telehealth: Payer: Self-pay | Admitting: Acute Care

## 2014-12-17 ENCOUNTER — Other Ambulatory Visit: Payer: Self-pay | Admitting: Acute Care

## 2014-12-17 DIAGNOSIS — Z87891 Personal history of nicotine dependence: Secondary | ICD-10-CM

## 2014-12-17 NOTE — Telephone Encounter (Signed)
I called to schedule Brett Russell for a lung cancer screening visit and CT scan. He is scheduled for a shared decision counseling visit 12/30/14 at 0930 at the [facility]. [place] office,and then his CT scan will be at [facility], radiology dept. On 01/01/15 at 0800 ( early per his request), he knows to be there at [place].He verbalized understanding of both the times and locations of both appointments.

## 2014-12-30 ENCOUNTER — Encounter: Payer: Self-pay | Admitting: Acute Care

## 2014-12-30 ENCOUNTER — Ambulatory Visit (INDEPENDENT_AMBULATORY_CARE_PROVIDER_SITE_OTHER): Payer: Medicare Other | Admitting: Acute Care

## 2014-12-30 DIAGNOSIS — Z87891 Personal history of nicotine dependence: Secondary | ICD-10-CM

## 2014-12-30 NOTE — Progress Notes (Signed)
Shared Decision Making Visit Lung Cancer Screening Program [phone])   Eligibility:  Age 69 y.o.  Pack Years Smoking History Calculation:45 years (# packs/per year x # years smoked)  Recent History of coughing up blood  no  Unexplained weight loss? no ( >Than 15 pounds within the last 6 months )  Prior History Lung / other cancer: Yes, ki renal cancer in 2007 which was surgically removed, requiring no chemo or radiation. (Diagnosis within the last 5 years already requiring surveillance chest CT Scans). Yes...diagnosis greater than 5 years ago.  Smoking Status Current Smoker  Former Smokers: Years since quit: N/A  Quit Date: N/A  Visit Components:  Discussion included one or more decision making aids. yes  Discussion included risk/benefits of screening. yes  Discussion included potential follow up diagnostic testing for abnormal scans. yes  Discussion included meaning and risk of over diagnosis. yes  Discussion included meaning and risk of False Positives. yes  Discussion included meaning of total radiation exposure. yes  Counseling Included:  Importance of adherence to annual lung cancer LDCT screening. yes  Impact of comorbidities on ability to participate in the program. yes  Ability and willingness to under diagnostic treatment. yes  Smoking Cessation Counseling:  Current Smokers:   Discussed importance of smoking cessation. yes  Information about tobacco cessation classes and interventions provided to patient. yes  Patient provided with "ticket" for LDCT Scan. yes  Symptomatic Patient. no  Counseling  Diagnosis Code: Tobacco Use Z72.0  Asymptomatic Patient : Yes  Counseling (Intermediate counseling: > three minutes counseling) [id number]  Former Smokers:   Discussed the importance of maintaining cigarette abstinence. N/A this patient is a current smoker  Diagnosis Code: Personal History of Nicotine Dependence. D32.671  Information about tobacco  cessation classes and interventions provided to patient. Yes  Patient provided with "ticket" for LDCT Scan. yes  Written Order for Lung Cancer Screening with LDCT placed in Epic. Yes (CT Chest Lung Cancer Screening Low Dose W/O CM) [id number] Z12.2-Screening of respiratory organs Z87.891-Personal history of nicotine dependence  I spent 20 minutes of face to face time with this patient discussing the risks and benefits of the lung cancer screening program. We viewed a power point presentation, stopping at intervals to ask questions and discuss information as necessary, discussing the risks and benefits of the program as defined above.We discussed that the single most powerful action that Mr. Pitta could take to decrease his risk of Lung Cancer was to stop smoking. He states that he is not currently ready to quit. I have told him that I want him to call me when he is ready to try, and that I will help him to meet this goal in any way I can. I gave him a " Be stronger than your excuses" card with community resources and information and my contact information . We discussed that this program is an annual program, and that the patient will be scanned once a year until he is 69 years old. He verbalized understanding. I gave him his ticket to ride the scanner. He is scheduled for [facility] on 01/01/15 at 8 am. He verbalized understanding of time, and location of the scan. He has my contact information if he has any further questions. Take aways from the appointment included a copy of the power point to refer to as needed and the smoking cessation resources card.   Magdalen Spatz, NP

## 2015-01-01 ENCOUNTER — Telehealth: Payer: Self-pay | Admitting: Acute Care

## 2015-01-01 ENCOUNTER — Ambulatory Visit (HOSPITAL_COMMUNITY)
Admission: RE | Admit: 2015-01-01 | Discharge: 2015-01-01 | Disposition: A | Discharge status: Home / Self Care | Payer: Medicare Other | Source: Ambulatory Visit | Attending: Acute Care | Admitting: Acute Care

## 2015-01-01 DIAGNOSIS — Z122 Encounter for screening for malignant neoplasm of respiratory organs: Secondary | ICD-10-CM | POA: Insufficient documentation

## 2015-01-01 DIAGNOSIS — Z87891 Personal history of nicotine dependence: Secondary | ICD-10-CM

## 2015-01-01 NOTE — Telephone Encounter (Signed)
I called to let Brett Russell know the results of his LDCT scan for lung cancer screening.I told him that there were no nodules that were concerning for lung cancer and that he will need his annual scan June of 2017. He verbalized understanding. His scan was classified as a Lung Rads 1, No nodules or definately benign nodules.I will fax these results to Dr. Sinda Du.

## 2015-01-14 DIAGNOSIS — Q828 Other specified congenital malformations of skin: Secondary | ICD-10-CM | POA: Diagnosis not present

## 2015-01-14 DIAGNOSIS — M79673 Pain in unspecified foot: Secondary | ICD-10-CM | POA: Diagnosis not present

## 2015-01-16 DIAGNOSIS — H3531 Nonexudative age-related macular degeneration: Secondary | ICD-10-CM | POA: Diagnosis not present

## 2015-01-16 DIAGNOSIS — H43811 Vitreous degeneration, right eye: Secondary | ICD-10-CM | POA: Diagnosis not present

## 2015-01-16 DIAGNOSIS — H3532 Exudative age-related macular degeneration: Secondary | ICD-10-CM | POA: Diagnosis not present

## 2015-03-06 DIAGNOSIS — H3532 Exudative age-related macular degeneration: Secondary | ICD-10-CM | POA: Diagnosis not present

## 2015-03-12 DIAGNOSIS — R21 Rash and other nonspecific skin eruption: Secondary | ICD-10-CM | POA: Diagnosis not present

## 2015-03-21 ENCOUNTER — Other Ambulatory Visit: Payer: Self-pay | Admitting: *Deleted

## 2015-03-21 ENCOUNTER — Other Ambulatory Visit: Payer: Self-pay

## 2015-03-21 DIAGNOSIS — Z01812 Encounter for preprocedural laboratory examination: Secondary | ICD-10-CM

## 2015-03-21 LAB — CREATININE, SERUM: CREATININE: 0.77 mg/dL (ref 0.70–1.25)

## 2015-03-25 ENCOUNTER — Ambulatory Visit
Admission: RE | Admit: 2015-03-25 | Discharge: 2015-03-25 | Disposition: A | Discharge status: Home / Self Care | Payer: Medicare Other | Source: Ambulatory Visit | Attending: Vascular Surgery | Admitting: Vascular Surgery

## 2015-03-25 ENCOUNTER — Ambulatory Visit: Payer: Medicare Other | Admitting: Vascular Surgery

## 2015-03-25 DIAGNOSIS — I714 Abdominal aortic aneurysm, without rupture, unspecified: Secondary | ICD-10-CM

## 2015-03-25 DIAGNOSIS — I723 Aneurysm of iliac artery: Secondary | ICD-10-CM

## 2015-03-25 DIAGNOSIS — Z48812 Encounter for surgical aftercare following surgery on the circulatory system: Secondary | ICD-10-CM

## 2015-03-25 MED ORDER — IOPAMIDOL (ISOVUE-370) INJECTION 76%
75.0000 mL | Freq: Once | INTRAVENOUS | Status: AC | PRN
  Administered 2015-03-25: 75 mL via INTRAVENOUS

## 2015-04-01 DIAGNOSIS — Q828 Other specified congenital malformations of skin: Secondary | ICD-10-CM | POA: Diagnosis not present

## 2015-04-01 DIAGNOSIS — M79673 Pain in unspecified foot: Secondary | ICD-10-CM | POA: Diagnosis not present

## 2015-04-07 ENCOUNTER — Encounter: Payer: Self-pay | Admitting: Vascular Surgery

## 2015-04-08 ENCOUNTER — Encounter: Payer: Self-pay | Admitting: Vascular Surgery

## 2015-04-08 ENCOUNTER — Ambulatory Visit (INDEPENDENT_AMBULATORY_CARE_PROVIDER_SITE_OTHER): Payer: Medicare Other | Admitting: Vascular Surgery

## 2015-04-08 VITALS — BP 132/78 | HR 100 | Temp 97.9°F | Resp 16 | Ht 68.0 in | Wt 178.0 lb

## 2015-04-08 DIAGNOSIS — I723 Aneurysm of iliac artery: Secondary | ICD-10-CM

## 2015-04-08 NOTE — Progress Notes (Signed)
Subjective:     Patient ID: Brett Russell, male   DOB: [birth year], 69 y.o.   MRN: [id number]  HPI This 69 year old male returns for continued follow-up regarding a left iliac artery aneurysm. He has a history of abdominal aortic aneurysm repair by me in 2007 with a tube graft. A few years ago he had a CT scan which revealed a 2.7 cm left iliac artery aneurysm and a 1.9 cm dilatation of the right iliac artery. He has done well from his aneurysm resection. He does have a history of partial nephrectomy left kidney for malignant tumor by Dr. Donaciano Eva . He has cysts in the left kidney which are chronic. This has not been followed actively in the recent past. Patient continues to smoke  Less than one half pack of cigarettes per day.  Past Medical History  Diagnosis Date  . GERD (gastroesophageal reflux disease)   . Parkinson disease 2006  . Aortic aneurysm 2007  . Renal cancer 09/2005    Left Kidney  . Hypertension   . Anxiety   . Gout   . AAA (abdominal aortic aneurysm)     Social History  Substance Use Topics  . Smoking status: Current Every Day Smoker -- 1.00 packs/day for 45 years    Types: Cigarettes    Start date: 08/11/1969  . Smokeless tobacco: Never Used     Comment: Patient states that he smoke 1ppd,he has smoked since age 83  . Alcohol Use: No    Family History  Problem Relation Age of Onset  . Kidney disease Father   . Healthy Brother   . Healthy Daughter   . Constipation Daughter   . Healthy Daughter     Allergies  Allergen Reactions  . Morphine And Related      Current outpatient prescriptions:  .  acetaminophen (TYLENOL) 325 MG tablet, Take 650 mg by mouth as needed for pain., Disp: , Rfl:  .  allopurinol (ZYLOPRIM) 300 MG tablet, Take 300 mg by mouth daily., Disp: , Rfl:  .  ALPRAZolam (XANAX) 1 MG tablet, Take 0.5-1 mg by mouth 2 (two) times daily. 1 in morning and 0.5 at bedtime., Disp: , Rfl:  .  amLODipine (NORVASC) 10 MG tablet, Take 10 mg by  mouth daily., Disp: , Rfl:  .  carbidopa-levodopa (SINEMET IR) 25-100 MG per tablet, Take 1 tablet by mouth 2 (two) times daily., Disp: , Rfl:  .  docusate sodium (COLACE) 100 MG capsule, Take 2 capsules (200 mg total) by mouth at bedtime., Disp: 1 capsule, Rfl: 0 .  Melatonin 1 MG TABS, Take 1 tablet by mouth at bedtime as needed (sleep). , Disp: , Rfl:  .  omeprazole (PRILOSEC) 20 MG capsule, Take 20 mg by mouth daily., Disp: , Rfl:  .  ondansetron (ZOFRAN) 4 MG tablet, Take 4 mg by mouth as needed for nausea., Disp: , Rfl:   Filed Vitals:   04/08/15 1035  BP: 132/78  Pulse: 100  Temp: 97.9 F (36.6 C)  Resp: 16  Height: 5\' 8"  (1.727 m)  Weight: 178 lb (80.74 kg)  SpO2: 95%    Body mass index is 27.07 kg/(m^2).           Review of Systems Denies chest pain, dyspnea on exertion, PND, orthopnea, lateralizing weakness, aphasia, amaurosis fugax, diplopia, claudication. Patient does have history of Parkinson's disease which has been stable and also history of GERD  Period patient has history of left renal cancer treated with partial  nephrectomy in 2007 with no evidence of recurrence. Other systems negative and complete review of systems     Objective:   Physical Exam BP 132/78 mmHg  Pulse 100  Temp(Src) 97.9 F (36.6 C)  Resp 16  Ht 5\' 8"  (1.727 m)  Wt 178 lb (80.74 kg)  BMI 27.07 kg/m2  SpO2 95%  Gen.-alert and oriented x3 in no apparent distress HEENT normal for age Lungs no rhonchi or wheezing Cardiovascular regular rhythm no murmurs carotid pulses 3+ palpable no bruits audible Abdomen soft nontender no palpable masses Musculoskeletal free of  major deformities Skin clear -no rashes Neurologic normal Lower extremities 3+ femoral and dorsalis pedis pulses palpable bilaterally with no edema   today I ordered a CT angiogram of the abdomen and pelvis which I reviewed back computer all of the images and also reviewed the report from the radiologist.  The left iliac  artery aneurysm now measures 2.8 cm in maximum diameter and the right iliac artery measures 2.1 cm in maximum diameter. The infrarenal aortic graft is stable. The cysts in the left kidney appear unchanged.       Assessment:      left iliac artery aneurysm-2.8 cm maximum diameter with right iliac artery measuring 2.1 cm maximum diameter Status post resection graft and abdominal aortic aneurysm in 2007   ongoing tobacco abuse  history of left renal cancer treated with partial nephrectomy Parkinson's diseasedoing well  GERD     Plan:      patient will continue to be followed in our office with intermittent CT angiogram to see if 1 or both iliac arteries will need to be treated with stent graft repair. Patient to return in 18 months with CT angiogram of abdomen and pelvis

## 2015-04-08 NOTE — Addendum Note (Signed)
Addended by: Mena Goes on: 04/08/2015 05:12 PM   Modules accepted: Orders

## 2015-04-24 DIAGNOSIS — H353212 Exudative age-related macular degeneration, right eye, with inactive choroidal neovascularization: Secondary | ICD-10-CM | POA: Diagnosis not present

## 2015-05-13 DIAGNOSIS — Q828 Other specified congenital malformations of skin: Secondary | ICD-10-CM | POA: Diagnosis not present

## 2015-05-13 DIAGNOSIS — M79673 Pain in unspecified foot: Secondary | ICD-10-CM | POA: Diagnosis not present

## 2015-06-10 DIAGNOSIS — I10 Essential (primary) hypertension: Secondary | ICD-10-CM | POA: Diagnosis not present

## 2015-06-10 DIAGNOSIS — I723 Aneurysm of iliac artery: Secondary | ICD-10-CM | POA: Diagnosis not present

## 2015-06-10 DIAGNOSIS — J449 Chronic obstructive pulmonary disease, unspecified: Secondary | ICD-10-CM | POA: Diagnosis not present

## 2015-06-10 DIAGNOSIS — G20 Parkinson's disease: Secondary | ICD-10-CM | POA: Diagnosis not present

## 2015-06-11 DIAGNOSIS — J449 Chronic obstructive pulmonary disease, unspecified: Secondary | ICD-10-CM | POA: Diagnosis not present

## 2015-06-11 DIAGNOSIS — Z125 Encounter for screening for malignant neoplasm of prostate: Secondary | ICD-10-CM | POA: Diagnosis not present

## 2015-06-11 DIAGNOSIS — I723 Aneurysm of iliac artery: Secondary | ICD-10-CM | POA: Diagnosis not present

## 2015-06-11 DIAGNOSIS — I10 Essential (primary) hypertension: Secondary | ICD-10-CM | POA: Diagnosis not present

## 2015-06-24 DIAGNOSIS — Q828 Other specified congenital malformations of skin: Secondary | ICD-10-CM | POA: Diagnosis not present

## 2015-06-24 DIAGNOSIS — M79673 Pain in unspecified foot: Secondary | ICD-10-CM | POA: Diagnosis not present

## 2015-06-26 DIAGNOSIS — H353211 Exudative age-related macular degeneration, right eye, with active choroidal neovascularization: Secondary | ICD-10-CM | POA: Diagnosis not present

## 2015-08-05 DIAGNOSIS — M79673 Pain in unspecified foot: Secondary | ICD-10-CM | POA: Diagnosis not present

## 2015-08-05 DIAGNOSIS — Q828 Other specified congenital malformations of skin: Secondary | ICD-10-CM | POA: Diagnosis not present

## 2015-08-28 DIAGNOSIS — H353122 Nonexudative age-related macular degeneration, left eye, intermediate dry stage: Secondary | ICD-10-CM | POA: Diagnosis not present

## 2015-08-28 DIAGNOSIS — H353212 Exudative age-related macular degeneration, right eye, with inactive choroidal neovascularization: Secondary | ICD-10-CM | POA: Diagnosis not present

## 2015-09-16 DIAGNOSIS — M79673 Pain in unspecified foot: Secondary | ICD-10-CM | POA: Diagnosis not present

## 2015-09-16 DIAGNOSIS — Q828 Other specified congenital malformations of skin: Secondary | ICD-10-CM | POA: Diagnosis not present

## 2015-10-07 ENCOUNTER — Other Ambulatory Visit: Payer: Self-pay | Admitting: Acute Care

## 2015-10-07 DIAGNOSIS — F17210 Nicotine dependence, cigarettes, uncomplicated: Principal | ICD-10-CM

## 2015-10-13 ENCOUNTER — Other Ambulatory Visit: Payer: Medicare Other

## 2015-10-24 DIAGNOSIS — Z0000 Encounter for general adult medical examination without abnormal findings: Secondary | ICD-10-CM | POA: Diagnosis not present

## 2015-10-24 DIAGNOSIS — Z1211 Encounter for screening for malignant neoplasm of colon: Secondary | ICD-10-CM | POA: Diagnosis not present

## 2015-10-28 DIAGNOSIS — Q828 Other specified congenital malformations of skin: Secondary | ICD-10-CM | POA: Diagnosis not present

## 2015-10-28 DIAGNOSIS — M79673 Pain in unspecified foot: Secondary | ICD-10-CM | POA: Diagnosis not present

## 2015-10-30 DIAGNOSIS — H353212 Exudative age-related macular degeneration, right eye, with inactive choroidal neovascularization: Secondary | ICD-10-CM | POA: Diagnosis not present

## 2015-12-05 ENCOUNTER — Ambulatory Visit (HOSPITAL_COMMUNITY): Payer: Medicare Other

## 2015-12-09 DIAGNOSIS — M79673 Pain in unspecified foot: Secondary | ICD-10-CM | POA: Diagnosis not present

## 2015-12-09 DIAGNOSIS — Q828 Other specified congenital malformations of skin: Secondary | ICD-10-CM | POA: Diagnosis not present

## 2015-12-22 DIAGNOSIS — D225 Melanocytic nevi of trunk: Secondary | ICD-10-CM | POA: Diagnosis not present

## 2015-12-22 DIAGNOSIS — L821 Other seborrheic keratosis: Secondary | ICD-10-CM | POA: Diagnosis not present

## 2016-01-01 DIAGNOSIS — H353212 Exudative age-related macular degeneration, right eye, with inactive choroidal neovascularization: Secondary | ICD-10-CM | POA: Diagnosis not present

## 2016-01-05 ENCOUNTER — Ambulatory Visit (HOSPITAL_COMMUNITY)
Admission: RE | Admit: 2016-01-05 | Discharge: 2016-01-05 | Disposition: A | Discharge status: Home / Self Care | Payer: Medicare Other | Source: Ambulatory Visit | Attending: Acute Care | Admitting: Acute Care

## 2016-01-05 DIAGNOSIS — I700 Atherosclerosis of aorta: Secondary | ICD-10-CM | POA: Diagnosis not present

## 2016-01-05 DIAGNOSIS — F17210 Nicotine dependence, cigarettes, uncomplicated: Secondary | ICD-10-CM | POA: Diagnosis not present

## 2016-01-05 DIAGNOSIS — I2510 Atherosclerotic heart disease of native coronary artery without angina pectoris: Secondary | ICD-10-CM | POA: Insufficient documentation

## 2016-01-05 DIAGNOSIS — Z122 Encounter for screening for malignant neoplasm of respiratory organs: Secondary | ICD-10-CM | POA: Insufficient documentation

## 2016-01-23 DIAGNOSIS — Q828 Other specified congenital malformations of skin: Secondary | ICD-10-CM | POA: Diagnosis not present

## 2016-01-23 DIAGNOSIS — M79673 Pain in unspecified foot: Secondary | ICD-10-CM | POA: Diagnosis not present

## 2016-02-27 DIAGNOSIS — J449 Chronic obstructive pulmonary disease, unspecified: Secondary | ICD-10-CM | POA: Diagnosis not present

## 2016-02-27 DIAGNOSIS — I10 Essential (primary) hypertension: Secondary | ICD-10-CM | POA: Diagnosis not present

## 2016-02-27 DIAGNOSIS — I723 Aneurysm of iliac artery: Secondary | ICD-10-CM | POA: Diagnosis not present

## 2016-02-27 DIAGNOSIS — G20 Parkinson's disease: Secondary | ICD-10-CM | POA: Diagnosis not present

## 2016-03-05 DIAGNOSIS — M79673 Pain in unspecified foot: Secondary | ICD-10-CM | POA: Diagnosis not present

## 2016-03-05 DIAGNOSIS — Q828 Other specified congenital malformations of skin: Secondary | ICD-10-CM | POA: Diagnosis not present

## 2016-03-09 DIAGNOSIS — H353211 Exudative age-related macular degeneration, right eye, with active choroidal neovascularization: Secondary | ICD-10-CM | POA: Diagnosis not present

## 2016-03-12 ENCOUNTER — Other Ambulatory Visit: Payer: Self-pay

## 2016-04-05 DIAGNOSIS — H5310 Unspecified subjective visual disturbances: Secondary | ICD-10-CM | POA: Diagnosis not present

## 2016-04-05 DIAGNOSIS — H4311 Vitreous hemorrhage, right eye: Secondary | ICD-10-CM | POA: Diagnosis not present

## 2016-04-05 DIAGNOSIS — H43813 Vitreous degeneration, bilateral: Secondary | ICD-10-CM | POA: Diagnosis not present

## 2016-04-05 DIAGNOSIS — Z961 Presence of intraocular lens: Secondary | ICD-10-CM | POA: Diagnosis not present

## 2016-04-13 DIAGNOSIS — H353211 Exudative age-related macular degeneration, right eye, with active choroidal neovascularization: Secondary | ICD-10-CM | POA: Diagnosis not present

## 2016-04-16 DIAGNOSIS — Q828 Other specified congenital malformations of skin: Secondary | ICD-10-CM | POA: Diagnosis not present

## 2016-04-16 DIAGNOSIS — M79673 Pain in unspecified foot: Secondary | ICD-10-CM | POA: Diagnosis not present

## 2016-05-04 DIAGNOSIS — H353211 Exudative age-related macular degeneration, right eye, with active choroidal neovascularization: Secondary | ICD-10-CM | POA: Diagnosis not present

## 2016-05-13 DIAGNOSIS — R109 Unspecified abdominal pain: Secondary | ICD-10-CM | POA: Diagnosis not present

## 2016-05-14 ENCOUNTER — Other Ambulatory Visit (HOSPITAL_COMMUNITY): Payer: Self-pay | Admitting: Pulmonary Disease

## 2016-05-14 DIAGNOSIS — R1084 Generalized abdominal pain: Secondary | ICD-10-CM

## 2016-05-17 ENCOUNTER — Ambulatory Visit (HOSPITAL_COMMUNITY)
Admission: RE | Admit: 2016-05-17 | Discharge: 2016-05-17 | Disposition: A | Discharge status: Home / Self Care | Payer: Medicare Other | Source: Ambulatory Visit | Attending: Pulmonary Disease | Admitting: Pulmonary Disease

## 2016-05-17 DIAGNOSIS — I723 Aneurysm of iliac artery: Secondary | ICD-10-CM | POA: Diagnosis not present

## 2016-05-17 DIAGNOSIS — R1084 Generalized abdominal pain: Secondary | ICD-10-CM | POA: Diagnosis not present

## 2016-05-17 DIAGNOSIS — K5790 Diverticulosis of intestine, part unspecified, without perforation or abscess without bleeding: Secondary | ICD-10-CM | POA: Diagnosis not present

## 2016-05-17 DIAGNOSIS — K5730 Diverticulosis of large intestine without perforation or abscess without bleeding: Secondary | ICD-10-CM | POA: Diagnosis not present

## 2016-05-17 LAB — POCT I-STAT CREATININE: Creatinine, Ser: 0.8 mg/dL (ref 0.61–1.24)

## 2016-05-17 MED ORDER — IOPAMIDOL (ISOVUE-300) INJECTION 61%
100.0000 mL | Freq: Once | INTRAVENOUS | Status: AC | PRN
  Administered 2016-05-17: 100 mL via INTRAVENOUS

## 2016-06-18 DIAGNOSIS — M79673 Pain in unspecified foot: Secondary | ICD-10-CM | POA: Diagnosis not present

## 2016-06-18 DIAGNOSIS — Q828 Other specified congenital malformations of skin: Secondary | ICD-10-CM | POA: Diagnosis not present

## 2016-07-02 DIAGNOSIS — J329 Chronic sinusitis, unspecified: Secondary | ICD-10-CM | POA: Diagnosis not present

## 2016-07-02 DIAGNOSIS — J449 Chronic obstructive pulmonary disease, unspecified: Secondary | ICD-10-CM | POA: Diagnosis not present

## 2016-07-02 DIAGNOSIS — I10 Essential (primary) hypertension: Secondary | ICD-10-CM | POA: Diagnosis not present

## 2016-07-02 DIAGNOSIS — G20 Parkinson's disease: Secondary | ICD-10-CM | POA: Diagnosis not present

## 2016-07-07 DIAGNOSIS — G20 Parkinson's disease: Secondary | ICD-10-CM | POA: Diagnosis not present

## 2016-07-07 DIAGNOSIS — Z125 Encounter for screening for malignant neoplasm of prostate: Secondary | ICD-10-CM | POA: Diagnosis not present

## 2016-07-07 DIAGNOSIS — I10 Essential (primary) hypertension: Secondary | ICD-10-CM | POA: Diagnosis not present

## 2016-07-07 DIAGNOSIS — J449 Chronic obstructive pulmonary disease, unspecified: Secondary | ICD-10-CM | POA: Diagnosis not present

## 2016-07-07 DIAGNOSIS — J329 Chronic sinusitis, unspecified: Secondary | ICD-10-CM | POA: Diagnosis not present

## 2016-07-30 DIAGNOSIS — Q828 Other specified congenital malformations of skin: Secondary | ICD-10-CM | POA: Diagnosis not present

## 2016-07-30 DIAGNOSIS — M79673 Pain in unspecified foot: Secondary | ICD-10-CM | POA: Diagnosis not present

## 2016-08-03 ENCOUNTER — Other Ambulatory Visit: Payer: Self-pay

## 2016-08-03 DIAGNOSIS — I723 Aneurysm of iliac artery: Secondary | ICD-10-CM

## 2016-08-11 DIAGNOSIS — H353211 Exudative age-related macular degeneration, right eye, with active choroidal neovascularization: Secondary | ICD-10-CM | POA: Diagnosis not present

## 2016-08-24 ENCOUNTER — Encounter: Payer: Self-pay | Admitting: Internal Medicine

## 2016-09-10 DIAGNOSIS — Q828 Other specified congenital malformations of skin: Secondary | ICD-10-CM | POA: Diagnosis not present

## 2016-09-10 DIAGNOSIS — M79673 Pain in unspecified foot: Secondary | ICD-10-CM | POA: Diagnosis not present

## 2016-10-08 ENCOUNTER — Encounter: Payer: Self-pay | Admitting: Vascular Surgery

## 2016-10-11 ENCOUNTER — Ambulatory Visit (HOSPITAL_COMMUNITY): Payer: Medicare Other

## 2016-10-11 ENCOUNTER — Ambulatory Visit (HOSPITAL_COMMUNITY)
Admission: RE | Admit: 2016-10-11 | Discharge: 2016-10-11 | Disposition: A | Discharge status: Home / Self Care | Payer: Medicare Other | Source: Ambulatory Visit | Attending: Vascular Surgery | Admitting: Vascular Surgery

## 2016-10-11 DIAGNOSIS — Z8679 Personal history of other diseases of the circulatory system: Secondary | ICD-10-CM | POA: Diagnosis not present

## 2016-10-11 DIAGNOSIS — I8392 Asymptomatic varicose veins of left lower extremity: Secondary | ICD-10-CM | POA: Diagnosis not present

## 2016-10-11 DIAGNOSIS — Z9889 Other specified postprocedural states: Secondary | ICD-10-CM | POA: Diagnosis not present

## 2016-10-11 DIAGNOSIS — K5730 Diverticulosis of large intestine without perforation or abscess without bleeding: Secondary | ICD-10-CM | POA: Insufficient documentation

## 2016-10-11 DIAGNOSIS — I714 Abdominal aortic aneurysm, without rupture: Secondary | ICD-10-CM | POA: Diagnosis not present

## 2016-10-11 DIAGNOSIS — I723 Aneurysm of iliac artery: Secondary | ICD-10-CM

## 2016-10-11 LAB — POCT I-STAT CREATININE: Creatinine, Ser: 0.9 mg/dL (ref 0.61–1.24)

## 2016-10-11 MED ORDER — IOPAMIDOL (ISOVUE-370) INJECTION 76%
150.0000 mL | Freq: Once | INTRAVENOUS | Status: AC | PRN
  Administered 2016-10-11: 150 mL via INTRAVENOUS

## 2016-10-13 ENCOUNTER — Encounter: Payer: Self-pay | Admitting: Vascular Surgery

## 2016-10-19 ENCOUNTER — Ambulatory Visit: Payer: Medicare Other | Admitting: Vascular Surgery

## 2016-10-20 DIAGNOSIS — H353211 Exudative age-related macular degeneration, right eye, with active choroidal neovascularization: Secondary | ICD-10-CM | POA: Diagnosis not present

## 2016-10-26 ENCOUNTER — Encounter: Payer: Self-pay | Admitting: Vascular Surgery

## 2016-10-26 ENCOUNTER — Ambulatory Visit (INDEPENDENT_AMBULATORY_CARE_PROVIDER_SITE_OTHER): Payer: Medicare Other | Admitting: Vascular Surgery

## 2016-10-26 VITALS — BP 135/78 | HR 96 | Temp 97.6°F | Resp 18 | Ht 68.0 in | Wt 186.5 lb

## 2016-10-26 DIAGNOSIS — I723 Aneurysm of iliac artery: Secondary | ICD-10-CM | POA: Diagnosis not present

## 2016-10-26 NOTE — Progress Notes (Signed)
Vascular and Vein Specialist of [place]  Patient name: Brett Russell MRN: [id number] DOB: Oct 11, 1945 Sex: male  REASON FOR VISIT: Follow-up iliac artery aneurysm  HPI: Brett Russell is a 71 y.o. male who presents for 18 month follow-up of his left common iliac artery aneurysm. He previously underwent open abdominal aortic aneurysm repair by Dr. Kellie Simmering in 2007 with a tube graft. His last CT scan 18 months ago revealed a 2.7 cm left common iliac artery aneurysm.   His past medical history includes hypertension, Parkinson's, gout and anxiety. He has a ventral hernia. He previously underwent open appendectomy. His dad had an AAA as well. Previously underwent partial nephrectomy of the left kidney for malignant tumor. He continues to smoke.  Past Medical History:  Diagnosis Date  . AAA (abdominal aortic aneurysm) ([place])   . Anxiety   . Aortic aneurysm ([place]) 2007  . GERD (gastroesophageal reflux disease)   . Gout   . Hypertension   . Parkinson disease ([place]) 2006  . Renal cancer ([place]) 09/2005   Left Kidney    Family History  Problem Relation Age of Onset  . Kidney disease Father   . Heart disease Father   . AAA (abdominal aortic aneurysm) Father   . Healthy Brother   . Healthy Daughter   . Constipation Daughter   . Healthy Daughter     SOCIAL HISTORY: Social History  Substance Use Topics  . Smoking status: Current Every Day Smoker    Packs/day: 1.00    Years: 45.00    Types: Cigarettes    Start date: 08/11/1969  . Smokeless tobacco: Never Used     Comment: Patient states that he smoke 1ppd,he has smoked since age 51  . Alcohol use No    Allergies  Allergen Reactions  . Morphine And Related     Current Outpatient Prescriptions  Medication Sig Dispense Refill  . acetaminophen (TYLENOL) 325 MG tablet Take 650 mg by mouth as needed for pain.    Marland Kitchen allopurinol (ZYLOPRIM) 300 MG tablet Take 300 mg by mouth daily.    Marland Kitchen ALPRAZolam (XANAX) 1 MG tablet Take 0.5-1 mg by  mouth 2 (two) times daily. 1 in morning and 0.5 at bedtime.    Marland Kitchen amLODipine (NORVASC) 10 MG tablet Take 10 mg by mouth daily.    . carbidopa-levodopa (SINEMET IR) 25-100 MG per tablet Take 1 tablet by mouth 2 (two) times daily.    Marland Kitchen docusate sodium (COLACE) 100 MG capsule Take 2 capsules (200 mg total) by mouth at bedtime. 1 capsule 0  . Melatonin 1 MG TABS Take 1 tablet by mouth at bedtime as needed (sleep).     Marland Kitchen omeprazole (PRILOSEC) 20 MG capsule Take 20 mg by mouth daily.    . ondansetron (ZOFRAN) 4 MG tablet Take 4 mg by mouth as needed for nausea.     No current facility-administered medications for this visit.     REVIEW OF SYSTEMS:  [X]  denotes positive finding, [ ]  denotes negative finding Cardiac  Comments:  Chest pain or chest pressure:    Shortness of breath upon exertion: x   Short of breath when lying flat:    Irregular heart rhythm:        Vascular    Pain in calf, thigh, or hip brought on by ambulation:    Pain in feet at night that wakes you up from your sleep:     Blood clot in your veins:    Leg swelling:  Pulmonary    Oxygen at home:    Productive cough:  x   Wheezing:         Neurologic    Sudden weakness in arms or legs:     Sudden numbness in arms or legs:     Sudden onset of difficulty speaking or slurred speech:    Temporary loss of vision in one eye:     Problems with dizziness:         Gastrointestinal    Blood in stool:     Vomited blood:         Genitourinary    Burning when urinating:     Blood in urine:        Psychiatric    Major depression:         Hematologic    Bleeding problems:    Problems with blood clotting too easily:        Skin    Rashes or ulcers:        Constitutional    Fever or chills:      PHYSICAL EXAM: Vitals:   10/26/16 1500  BP: 135/78  Pulse: 96  Resp: 18  Temp: 97.6 F (36.4 C)  TempSrc: Oral  SpO2: 95%  Weight: 186 lb 8 oz (84.6 kg)  Height: 5\' 8"  (1.727 m)    GENERAL: The patient is  a well-nourished male, in no acute distress. The vital signs are documented above. HEENT: normocephalic, atraumatic. No abnormalities noted.  CARDIAC: There is a regular rate and rhythm. No carotid bruits. VASCULAR: 2+ femoral, 2+ Rosales pedis and 2+ posterior tibial pulses bilaterally. PULMONARY: There is good air exchange bilaterally without wheezing or rales. ABDOMEN: Soft and non-tender. Ventral hernia. No pulsatile mass. MUSCULOSKELETAL: There are no major deformities or cyanosis. NEUROLOGIC: No focal weakness or paresthesias are detected. SKIN: There are no ulcers or rashes noted. PSYCHIATRIC: The patient has a normal affect.  DATA:  CTA abd/pelv 10/11/16  Infrarenal aortic graft is stable. Left common iliac artery aneurysm now measures 3.3 cm. Left internal iliac disease is patent. Left common femoral artery is widely patent.   MEDICAL ISSUES: Left common iliac artery aneurysm  There is an interval enlargement of the left common iliac artery aneurysm to 3.3 cm from 2.7 cm 18 months ago. patient has a prior history of abdominal aortic aneurysm repair with a tube graft in 2007. Given the progression of his aneurysm, would recommend surgical intervention. He is likely an endovascular candidate. Dr. Donnetta Hutching to discuss with his partners and with device representative. The patient will be scheduled for left common iliac artery aneurysm repair in the near future.  Virgina Jock, PA-C [facility] and Vein Specialists of [facility] MD: Early  Extensive discussion with the patient. He has had continued dilatation of his left common iliac artery below his tube graft. At his young age and good health would recommend elective repair. Explained that he should be able to be treated with endovascular means. This in all likelihood will require coiling of his hypogastric artery. We'll review his films and make recommendations.

## 2016-10-29 DIAGNOSIS — I723 Aneurysm of iliac artery: Secondary | ICD-10-CM | POA: Diagnosis not present

## 2016-10-29 DIAGNOSIS — J329 Chronic sinusitis, unspecified: Secondary | ICD-10-CM | POA: Diagnosis not present

## 2016-10-29 DIAGNOSIS — J449 Chronic obstructive pulmonary disease, unspecified: Secondary | ICD-10-CM | POA: Diagnosis not present

## 2016-10-29 DIAGNOSIS — I10 Essential (primary) hypertension: Secondary | ICD-10-CM | POA: Diagnosis not present

## 2016-11-19 DIAGNOSIS — M79671 Pain in right foot: Secondary | ICD-10-CM | POA: Diagnosis not present

## 2016-11-19 DIAGNOSIS — M7751 Other enthesopathy of right foot: Secondary | ICD-10-CM | POA: Diagnosis not present

## 2016-11-23 ENCOUNTER — Other Ambulatory Visit: Payer: Self-pay

## 2016-12-01 ENCOUNTER — Encounter (HOSPITAL_COMMUNITY): Payer: Self-pay

## 2016-12-01 ENCOUNTER — Encounter (HOSPITAL_COMMUNITY)
Admission: RE | Admit: 2016-12-01 | Discharge: 2016-12-01 | Disposition: A | Discharge status: Home / Self Care | Payer: Medicare Other | Source: Ambulatory Visit | Attending: Vascular Surgery | Admitting: Vascular Surgery

## 2016-12-01 DIAGNOSIS — N400 Enlarged prostate without lower urinary tract symptoms: Secondary | ICD-10-CM | POA: Diagnosis not present

## 2016-12-01 DIAGNOSIS — K439 Ventral hernia without obstruction or gangrene: Secondary | ICD-10-CM | POA: Diagnosis not present

## 2016-12-01 DIAGNOSIS — Z905 Acquired absence of kidney: Secondary | ICD-10-CM | POA: Diagnosis not present

## 2016-12-01 DIAGNOSIS — F419 Anxiety disorder, unspecified: Secondary | ICD-10-CM | POA: Diagnosis not present

## 2016-12-01 DIAGNOSIS — H3530 Unspecified macular degeneration: Secondary | ICD-10-CM | POA: Diagnosis not present

## 2016-12-01 DIAGNOSIS — I723 Aneurysm of iliac artery: Secondary | ICD-10-CM | POA: Diagnosis not present

## 2016-12-01 DIAGNOSIS — M109 Gout, unspecified: Secondary | ICD-10-CM | POA: Diagnosis not present

## 2016-12-01 DIAGNOSIS — I10 Essential (primary) hypertension: Secondary | ICD-10-CM | POA: Diagnosis not present

## 2016-12-01 DIAGNOSIS — K219 Gastro-esophageal reflux disease without esophagitis: Secondary | ICD-10-CM | POA: Diagnosis not present

## 2016-12-01 DIAGNOSIS — G20 Parkinson's disease: Secondary | ICD-10-CM | POA: Diagnosis not present

## 2016-12-01 DIAGNOSIS — G4700 Insomnia, unspecified: Secondary | ICD-10-CM | POA: Diagnosis not present

## 2016-12-01 DIAGNOSIS — J449 Chronic obstructive pulmonary disease, unspecified: Secondary | ICD-10-CM | POA: Diagnosis not present

## 2016-12-01 HISTORY — DX: Acute poliomyelitis, unspecified: A80.9

## 2016-12-01 HISTORY — DX: Unspecified osteoarthritis, unspecified site: M19.90

## 2016-12-01 HISTORY — DX: Insomnia, unspecified: G47.00

## 2016-12-01 HISTORY — DX: Personal history of colon polyps, unspecified: Z86.0100

## 2016-12-01 HISTORY — DX: Personal history of other diseases of the respiratory system: Z87.09

## 2016-12-01 HISTORY — DX: Chronic obstructive pulmonary disease, unspecified: J44.9

## 2016-12-01 HISTORY — DX: Unspecified macular degeneration: H35.30

## 2016-12-01 HISTORY — DX: Allergy, unspecified, initial encounter: T78.40XA

## 2016-12-01 HISTORY — DX: Personal history of urinary calculi: Z87.442

## 2016-12-01 HISTORY — DX: Benign prostatic hyperplasia without lower urinary tract symptoms: N40.0

## 2016-12-01 HISTORY — DX: Personal history of colonic polyps: Z86.010

## 2016-12-01 LAB — COMPREHENSIVE METABOLIC PANEL
ALT: 8 U/L — ABNORMAL LOW (ref 17–63)
ANION GAP: 8 (ref 5–15)
AST: 17 U/L (ref 15–41)
Albumin: 4.1 g/dL (ref 3.5–5.0)
Alkaline Phosphatase: 66 U/L (ref 38–126)
BILIRUBIN TOTAL: 0.7 mg/dL (ref 0.3–1.2)
BUN: 7 mg/dL (ref 6–20)
CALCIUM: 9.4 mg/dL (ref 8.9–10.3)
CHLORIDE: 100 mmol/L — AB (ref 101–111)
CO2: 31 mmol/L (ref 22–32)
CREATININE: 0.68 mg/dL (ref 0.61–1.24)
Glucose, Bld: 103 mg/dL — ABNORMAL HIGH (ref 65–99)
POTASSIUM: 3.8 mmol/L (ref 3.5–5.1)
Sodium: 139 mmol/L (ref 135–145)
Total Protein: 7.3 g/dL (ref 6.5–8.1)

## 2016-12-01 LAB — BLOOD GAS, ARTERIAL
ACID-BASE EXCESS: 5.6 mmol/L — AB (ref 0.0–2.0)
BICARBONATE: 30.2 mmol/L — AB (ref 20.0–28.0)
DRAWN BY: 470591
FIO2: 21
O2 Saturation: 97.4 %
PCO2 ART: 49.2 mmHg — AB (ref 32.0–48.0)
Patient temperature: 98.6
pH, Arterial: 7.405 (ref 7.350–7.450)
pO2, Arterial: 93.5 mmHg (ref 83.0–108.0)

## 2016-12-01 LAB — URINALYSIS, ROUTINE W REFLEX MICROSCOPIC
Bacteria, UA: NONE SEEN
Bilirubin Urine: NEGATIVE
Glucose, UA: NEGATIVE mg/dL
KETONES UR: NEGATIVE mg/dL
LEUKOCYTES UA: NEGATIVE
Nitrite: NEGATIVE
PROTEIN: 100 mg/dL — AB
Specific Gravity, Urine: 1.011 (ref 1.005–1.030)
pH: 7 (ref 5.0–8.0)

## 2016-12-01 LAB — CBC
HCT: 51.4 % (ref 39.0–52.0)
Hemoglobin: 17.4 g/dL — ABNORMAL HIGH (ref 13.0–17.0)
MCH: 31.8 pg (ref 26.0–34.0)
MCHC: 33.9 g/dL (ref 30.0–36.0)
MCV: 93.8 fL (ref 78.0–100.0)
Platelets: 202 10*3/uL (ref 150–400)
RBC: 5.48 MIL/uL (ref 4.22–5.81)
RDW: 13.6 % (ref 11.5–15.5)
WBC: 9.5 10*3/uL (ref 4.0–10.5)

## 2016-12-01 LAB — TYPE AND SCREEN
ABO/RH(D): AB NEG
Antibody Screen: NEGATIVE

## 2016-12-01 LAB — SURGICAL PCR SCREEN
MRSA, PCR: NEGATIVE
Staphylococcus aureus: NEGATIVE

## 2016-12-01 LAB — PROTIME-INR
INR: 0.91
PROTHROMBIN TIME: 12.2 s (ref 11.4–15.2)

## 2016-12-01 LAB — APTT: aPTT: 35 seconds (ref 24–36)

## 2016-12-01 MED ORDER — CHLORHEXIDINE GLUCONATE 4 % EX LIQD: 60.0000 mL | Freq: Once | CUTANEOUS | Status: DC

## 2016-12-01 NOTE — Pre-Procedure Instructions (Signed)
Brett Russell  12/01/2016      [place] [place] [address] Phone: [phone] Fax: [phone]    Your procedure is scheduled on Fri, May 18 @ 7:30 AM  Report to [facility] Admitting at 5:30 AM  Call this number if you have problems the morning of surgery:  [phone]   Remember:  Do not eat food or drink liquids after midnight.  Take these medicines the morning of surgery with A SIP OF WATER Xanax(Alprazolam),Carbidopa-Levodopa(Sinemet),Zofran(Ondansetron(if needed),and Omeprazole(Prilosec)              Stop taking your Krill Oil along with any Vitamins or Herbal Medications. No Goody's,BC's,Aleve,Advil,Motrin,Ibuprofen,Aspirin,or Fish Oil.   Do not wear jewelry.  Do not wear lotions, powders,colognes, or deoderant.             Men may shave face and neck.  Do not bring valuables to the hospital.  [facility] is not responsible for any belongings or valuables.  Contacts, dentures or bridgework may not be worn into surgery.  Leave your suitcase in the car.  After surgery it may be brought to your room.  For patients admitted to the hospital, discharge time will be determined by your treatment team.  Patients discharged the day of surgery will not be allowed to drive home.    [facility]  Before surgery, you can play an important role.  Because skin is not sterile, your skin needs to be as free of germs as possible.  You can reduce the number of germs on you skin by washing with CHG (chlorahexidine gluconate) soap before surgery.  CHG is an antiseptic cleaner which kills germs and bonds with the skin to continue killing germs even after washing.  Please DO NOT use if you have an allergy to CHG or antibacterial soaps.  If your skin becomes reddened/irritated stop using the CHG and inform your nurse when you arrive at Short Stay.  Do not shave (including legs  and underarms) for at least 48 hours prior to the first CHG shower.  You may shave your face.  Please follow these instructions carefully:   1.  Shower with CHG Soap the night before surgery and the                                morning of Surgery.  2.  If you choose to wash your hair, wash your hair first as usual with your       normal shampoo.  3.  After you shampoo, rinse your hair and body thoroughly to remove the                      Shampoo.  4.  Use CHG as you would any other liquid soap.  You can apply chg directly       to the skin and wash gently with scrungie or a clean washcloth.  5.  Apply the CHG Soap to your body ONLY FROM THE NECK DOWN.        Do not use on open wounds or open sores.  Avoid contact with your eyes,       ears, mouth and genitals (private parts).  Wash genitals (private parts)       with your normal soap.  6.  Wash thoroughly, paying  special attention to the area where your surgery        will be performed.  7.  Thoroughly rinse your body with warm water from the neck down.  8.  DO NOT shower/wash with your normal soap after using and rinsing off       the CHG Soap.  9.  Pat yourself dry with a clean towel.            10.  Wear clean pajamas.            11.  Place clean sheets on your bed the night of your first shower and do not        sleep with pets.  Day of Surgery  Do not apply any lotions/deoderants the morning of surgery.  Please wear clean clothes to the hospital/surgery center.    Please read over the following fact sheets that you were given. Pain Booklet, Coughing and Deep Breathing, MRSA Information and Surgical Site Infection Prevention

## 2016-12-01 NOTE — Progress Notes (Addendum)
Cardiologist denies  Medical Md is Dr.Edward Luan Pulling  Echo denies  Stress test in 2007  Heart cath denies  EKG denies in the past several yrs  CXR denies in past yr

## 2016-12-03 ENCOUNTER — Encounter (HOSPITAL_COMMUNITY)
Admission: RE | Disposition: A | Discharge status: Home / Self Care | Payer: Self-pay | Source: Ambulatory Visit | Attending: Vascular Surgery

## 2016-12-03 ENCOUNTER — Inpatient Hospital Stay (HOSPITAL_COMMUNITY): Payer: Medicare Other | Admitting: Certified Registered Nurse Anesthetist

## 2016-12-03 ENCOUNTER — Inpatient Hospital Stay (HOSPITAL_COMMUNITY)
Admission: RE | Admit: 2016-12-03 | Discharge: 2016-12-04 | DRG: 272 | Disposition: A | Discharge status: Home / Self Care | Payer: Medicare Other | Source: Ambulatory Visit | Attending: Vascular Surgery | Admitting: Vascular Surgery

## 2016-12-03 ENCOUNTER — Encounter (HOSPITAL_COMMUNITY): Payer: Self-pay | Admitting: *Deleted

## 2016-12-03 DIAGNOSIS — G20 Parkinson's disease: Secondary | ICD-10-CM | POA: Diagnosis present

## 2016-12-03 DIAGNOSIS — Z905 Acquired absence of kidney: Secondary | ICD-10-CM | POA: Diagnosis not present

## 2016-12-03 DIAGNOSIS — I723 Aneurysm of iliac artery: Principal | ICD-10-CM | POA: Diagnosis present

## 2016-12-03 DIAGNOSIS — F17210 Nicotine dependence, cigarettes, uncomplicated: Secondary | ICD-10-CM | POA: Diagnosis present

## 2016-12-03 DIAGNOSIS — I10 Essential (primary) hypertension: Secondary | ICD-10-CM | POA: Diagnosis present

## 2016-12-03 DIAGNOSIS — Z79899 Other long term (current) drug therapy: Secondary | ICD-10-CM

## 2016-12-03 DIAGNOSIS — G4700 Insomnia, unspecified: Secondary | ICD-10-CM | POA: Diagnosis present

## 2016-12-03 DIAGNOSIS — H3530 Unspecified macular degeneration: Secondary | ICD-10-CM | POA: Diagnosis present

## 2016-12-03 DIAGNOSIS — Z85528 Personal history of other malignant neoplasm of kidney: Secondary | ICD-10-CM

## 2016-12-03 DIAGNOSIS — K219 Gastro-esophageal reflux disease without esophagitis: Secondary | ICD-10-CM | POA: Diagnosis present

## 2016-12-03 DIAGNOSIS — K439 Ventral hernia without obstruction or gangrene: Secondary | ICD-10-CM | POA: Diagnosis present

## 2016-12-03 DIAGNOSIS — F419 Anxiety disorder, unspecified: Secondary | ICD-10-CM | POA: Diagnosis present

## 2016-12-03 DIAGNOSIS — J449 Chronic obstructive pulmonary disease, unspecified: Secondary | ICD-10-CM | POA: Diagnosis present

## 2016-12-03 DIAGNOSIS — N400 Enlarged prostate without lower urinary tract symptoms: Secondary | ICD-10-CM | POA: Diagnosis present

## 2016-12-03 DIAGNOSIS — M109 Gout, unspecified: Secondary | ICD-10-CM | POA: Diagnosis present

## 2016-12-03 HISTORY — PX: ABDOMINAL AORTIC ENDOVASCULAR STENT GRAFT: SHX5707

## 2016-12-03 LAB — GLUCOSE, CAPILLARY
GLUCOSE-CAPILLARY: 127 mg/dL — AB (ref 65–99)
GLUCOSE-CAPILLARY: 168 mg/dL — AB (ref 65–99)

## 2016-12-03 SURGERY — INSERTION, ENDOVASCULAR STENT GRAFT, AORTA, ABDOMINAL
Anesthesia: General | Laterality: Left

## 2016-12-03 MED ORDER — CARBIDOPA-LEVODOPA 25-100 MG PO TABS
1.0000 | ORAL_TABLET | Freq: Two times a day (BID) | ORAL | Status: DC
  Administered 2016-12-03 – 2016-12-04 (×2): 1 via ORAL
  Filled 2016-12-03 (×3): qty 1

## 2016-12-03 MED ORDER — SUGAMMADEX SODIUM 200 MG/2ML IV SOLN
INTRAVENOUS | Status: DC | PRN
  Administered 2016-12-03: 200 mg via INTRAVENOUS

## 2016-12-03 MED ORDER — HYDRALAZINE HCL 20 MG/ML IJ SOLN: 5.0000 mg | INTRAMUSCULAR | Status: DC | PRN

## 2016-12-03 MED ORDER — SODIUM CHLORIDE 0.9 % IV SOLN
500.0000 mL | Freq: Once | INTRAVENOUS | Status: AC | PRN
  Administered 2016-12-03: 500 mL via INTRAVENOUS

## 2016-12-03 MED ORDER — DEXAMETHASONE SODIUM PHOSPHATE 10 MG/ML IJ SOLN
INTRAMUSCULAR | Status: AC
  Filled 2016-12-03: qty 1

## 2016-12-03 MED ORDER — DEXAMETHASONE SODIUM PHOSPHATE 10 MG/ML IJ SOLN
INTRAMUSCULAR | Status: DC | PRN
  Administered 2016-12-03: 5 mg via INTRAVENOUS

## 2016-12-03 MED ORDER — ALLOPURINOL 300 MG PO TABS
300.0000 mg | ORAL_TABLET | Freq: Every day | ORAL | Status: DC
Start: 2016-12-03 — End: 2016-12-04
  Administered 2016-12-03: 300 mg via ORAL
  Filled 2016-12-03: qty 1

## 2016-12-03 MED ORDER — PHENYLEPHRINE HCL 10 MG/ML IJ SOLN
INTRAMUSCULAR | Status: DC | PRN
  Administered 2016-12-03: 30 ug/min via INTRAVENOUS

## 2016-12-03 MED ORDER — MELATONIN 3 MG PO TABS
3.0000 mg | ORAL_TABLET | Freq: Every evening | ORAL | Status: DC | PRN
  Administered 2016-12-04: 3 mg via ORAL
  Filled 2016-12-03 (×3): qty 1

## 2016-12-03 MED ORDER — PANTOPRAZOLE SODIUM 40 MG PO TBEC
40.0000 mg | DELAYED_RELEASE_TABLET | Freq: Every day | ORAL | Status: DC
  Administered 2016-12-04: 40 mg via ORAL
  Filled 2016-12-03 (×2): qty 1

## 2016-12-03 MED ORDER — EPHEDRINE SULFATE 50 MG/ML IJ SOLN
INTRAMUSCULAR | Status: DC | PRN
  Administered 2016-12-03: 10 mg via INTRAVENOUS
  Administered 2016-12-03 (×2): 15 mg via INTRAVENOUS

## 2016-12-03 MED ORDER — AMLODIPINE BESYLATE 10 MG PO TABS
10.0000 mg | ORAL_TABLET | Freq: Every day | ORAL | Status: DC
  Administered 2016-12-03: 10 mg via ORAL
  Filled 2016-12-03: qty 1

## 2016-12-03 MED ORDER — LABETALOL HCL 5 MG/ML IV SOLN: 10.0000 mg | INTRAVENOUS | Status: DC | PRN

## 2016-12-03 MED ORDER — IODIXANOL 320 MG/ML IV SOLN
INTRAVENOUS | Status: DC | PRN
Start: 2016-12-03 — End: 2016-12-03
  Administered 2016-12-03: 45.2 mL

## 2016-12-03 MED ORDER — MAGNESIUM SULFATE 2 GM/50ML IV SOLN: 2.0000 g | Freq: Every day | INTRAVENOUS | Status: DC | PRN

## 2016-12-03 MED ORDER — ARTIFICIAL TEARS OPHTHALMIC OINT
TOPICAL_OINTMENT | OPHTHALMIC | Status: AC
  Filled 2016-12-03: qty 3.5

## 2016-12-03 MED ORDER — DEXTROSE 5 % IV SOLN
1.5000 g | Freq: Two times a day (BID) | INTRAVENOUS | Status: AC
  Administered 2016-12-03 – 2016-12-04 (×2): 1.5 g via INTRAVENOUS
  Filled 2016-12-03 (×2): qty 1.5

## 2016-12-03 MED ORDER — ONDANSETRON HCL 4 MG/2ML IJ SOLN: 4.0000 mg | Freq: Four times a day (QID) | INTRAMUSCULAR | Status: DC | PRN

## 2016-12-03 MED ORDER — DOCUSATE SODIUM 100 MG PO CAPS
100.0000 mg | ORAL_CAPSULE | ORAL | Status: DC
  Administered 2016-12-03: 100 mg via ORAL
  Filled 2016-12-03: qty 1

## 2016-12-03 MED ORDER — SUGAMMADEX SODIUM 200 MG/2ML IV SOLN
INTRAVENOUS | Status: AC
  Filled 2016-12-03: qty 2

## 2016-12-03 MED ORDER — LORATADINE 10 MG PO TABS
10.0000 mg | ORAL_TABLET | Freq: Every day | ORAL | Status: DC
  Administered 2016-12-03 – 2016-12-04 (×2): 10 mg via ORAL
  Filled 2016-12-03 (×2): qty 1

## 2016-12-03 MED ORDER — ALPRAZOLAM 0.5 MG PO TABS
1.0000 mg | ORAL_TABLET | Freq: Every day | ORAL | Status: DC
  Administered 2016-12-04: 1 mg via ORAL
  Filled 2016-12-03: qty 2

## 2016-12-03 MED ORDER — ROCURONIUM BROMIDE 100 MG/10ML IV SOLN
INTRAVENOUS | Status: DC | PRN
  Administered 2016-12-03: 70 mg via INTRAVENOUS

## 2016-12-03 MED ORDER — GUAIFENESIN-DM 100-10 MG/5ML PO SYRP: 15.0000 mL | ORAL_SOLUTION | ORAL | Status: DC | PRN

## 2016-12-03 MED ORDER — ONDANSETRON HCL 4 MG/2ML IJ SOLN
INTRAMUSCULAR | Status: AC
  Filled 2016-12-03: qty 2

## 2016-12-03 MED ORDER — MIDAZOLAM HCL 5 MG/5ML IJ SOLN
INTRAMUSCULAR | Status: DC | PRN
  Administered 2016-12-03: 2 mg via INTRAVENOUS

## 2016-12-03 MED ORDER — EPHEDRINE 5 MG/ML INJ
INTRAVENOUS | Status: AC
  Filled 2016-12-03: qty 20

## 2016-12-03 MED ORDER — FENTANYL CITRATE (PF) 100 MCG/2ML IJ SOLN
INTRAMUSCULAR | Status: DC | PRN
  Administered 2016-12-03: 100 ug via INTRAVENOUS

## 2016-12-03 MED ORDER — LACTATED RINGERS IV SOLN
INTRAVENOUS | Status: DC | PRN
  Administered 2016-12-03 (×2): via INTRAVENOUS

## 2016-12-03 MED ORDER — PHENYLEPHRINE HCL 10 MG/ML IJ SOLN
INTRAMUSCULAR | Status: DC | PRN
  Administered 2016-12-03: 120 ug via INTRAVENOUS
  Administered 2016-12-03: 80 ug via INTRAVENOUS
  Administered 2016-12-03: 40 ug via INTRAVENOUS

## 2016-12-03 MED ORDER — LIDOCAINE 2% (20 MG/ML) 5 ML SYRINGE
INTRAMUSCULAR | Status: AC
  Filled 2016-12-03: qty 10

## 2016-12-03 MED ORDER — ARTIFICIAL TEARS OPHTHALMIC OINT
TOPICAL_OINTMENT | OPHTHALMIC | Status: DC | PRN
  Administered 2016-12-03: 1 via OPHTHALMIC

## 2016-12-03 MED ORDER — FENTANYL CITRATE (PF) 250 MCG/5ML IJ SOLN
INTRAMUSCULAR | Status: AC
  Filled 2016-12-03: qty 5

## 2016-12-03 MED ORDER — PHENOL 1.4 % MT LIQD: 1.0000 | OROMUCOSAL | Status: DC | PRN

## 2016-12-03 MED ORDER — LIDOCAINE HCL (CARDIAC) 20 MG/ML IV SOLN
INTRAVENOUS | Status: DC | PRN
  Administered 2016-12-03: 80 mg via INTRAVENOUS

## 2016-12-03 MED ORDER — SODIUM CHLORIDE 0.9 % IV SOLN
INTRAVENOUS | Status: DC
  Administered 2016-12-03: 12:00:00 via INTRAVENOUS

## 2016-12-03 MED ORDER — PHENYLEPHRINE 40 MCG/ML (10ML) SYRINGE FOR IV PUSH (FOR BLOOD PRESSURE SUPPORT)
PREFILLED_SYRINGE | INTRAVENOUS | Status: AC
  Filled 2016-12-03: qty 20

## 2016-12-03 MED ORDER — PROPOFOL 10 MG/ML IV BOLUS
INTRAVENOUS | Status: AC
  Filled 2016-12-03: qty 20

## 2016-12-03 MED ORDER — ACETAMINOPHEN 325 MG PO TABS
325.0000 mg | ORAL_TABLET | ORAL | Status: DC | PRN
  Administered 2016-12-03: 650 mg via ORAL
  Filled 2016-12-03: qty 2

## 2016-12-03 MED ORDER — OXYCODONE HCL 5 MG PO TABS: 5.0000 mg | ORAL_TABLET | Freq: Once | ORAL | Status: DC | PRN

## 2016-12-03 MED ORDER — TRAMADOL HCL 50 MG PO TABS: 50.0000 mg | ORAL_TABLET | Freq: Four times a day (QID) | ORAL | Status: DC | PRN

## 2016-12-03 MED ORDER — FENTANYL CITRATE (PF) 100 MCG/2ML IJ SOLN: 25.0000 ug | INTRAMUSCULAR | Status: DC | PRN

## 2016-12-03 MED ORDER — HEPARIN SODIUM (PORCINE) 1000 UNIT/ML IJ SOLN
INTRAMUSCULAR | Status: DC | PRN
  Administered 2016-12-03: 7000 [IU] via INTRAVENOUS

## 2016-12-03 MED ORDER — ALPRAZOLAM 0.5 MG PO TABS
0.5000 mg | ORAL_TABLET | Freq: Every day | ORAL | Status: DC
  Administered 2016-12-03: 0.5 mg via ORAL
  Filled 2016-12-03: qty 1

## 2016-12-03 MED ORDER — ACETAMINOPHEN 650 MG RE SUPP: 325.0000 mg | RECTAL | Status: DC | PRN

## 2016-12-03 MED ORDER — OXYCODONE HCL 5 MG/5ML PO SOLN: 5.0000 mg | Freq: Once | ORAL | Status: DC | PRN

## 2016-12-03 MED ORDER — MIDAZOLAM HCL 2 MG/2ML IJ SOLN
INTRAMUSCULAR | Status: AC
  Filled 2016-12-03: qty 2

## 2016-12-03 MED ORDER — SODIUM CHLORIDE 0.9 % IV SOLN: INTRAVENOUS | Status: DC

## 2016-12-03 MED ORDER — SODIUM CHLORIDE 0.9 % IV SOLN
INTRAVENOUS | Status: DC | PRN
  Administered 2016-12-03: 09:00:00

## 2016-12-03 MED ORDER — SUCCINYLCHOLINE CHLORIDE 200 MG/10ML IV SOSY
PREFILLED_SYRINGE | INTRAVENOUS | Status: AC
  Filled 2016-12-03: qty 10

## 2016-12-03 MED ORDER — ROCURONIUM BROMIDE 10 MG/ML (PF) SYRINGE
PREFILLED_SYRINGE | INTRAVENOUS | Status: AC
  Filled 2016-12-03: qty 5

## 2016-12-03 MED ORDER — 0.9 % SODIUM CHLORIDE (POUR BTL) OPTIME
TOPICAL | Status: DC | PRN
  Administered 2016-12-03: 2000 mL

## 2016-12-03 MED ORDER — DEXTROSE 5 % IV SOLN
1.5000 g | INTRAVENOUS | Status: AC
  Administered 2016-12-03: 1.5 g via INTRAVENOUS
  Filled 2016-12-03: qty 1.5

## 2016-12-03 MED ORDER — ALUM & MAG HYDROXIDE-SIMETH 200-200-20 MG/5ML PO SUSP: 15.0000 mL | ORAL | Status: DC | PRN

## 2016-12-03 MED ORDER — ONDANSETRON HCL 4 MG PO TABS: 4.0000 mg | ORAL_TABLET | Freq: Two times a day (BID) | ORAL | Status: DC | PRN

## 2016-12-03 MED ORDER — PROTAMINE SULFATE 10 MG/ML IV SOLN
INTRAVENOUS | Status: DC | PRN
  Administered 2016-12-03: 50 mg via INTRAVENOUS

## 2016-12-03 MED ORDER — PROPOFOL 10 MG/ML IV BOLUS
INTRAVENOUS | Status: DC | PRN
  Administered 2016-12-03: 140 mg via INTRAVENOUS

## 2016-12-03 MED ORDER — ONDANSETRON HCL 4 MG/2ML IJ SOLN
INTRAMUSCULAR | Status: DC | PRN
  Administered 2016-12-03: 4 mg via INTRAVENOUS

## 2016-12-03 MED ORDER — POTASSIUM CHLORIDE CRYS ER 20 MEQ PO TBCR: 20.0000 meq | EXTENDED_RELEASE_TABLET | Freq: Every day | ORAL | Status: DC | PRN

## 2016-12-03 MED ORDER — HYDROMORPHONE HCL 1 MG/ML IJ SOLN: 0.5000 mg | INTRAMUSCULAR | Status: DC | PRN

## 2016-12-03 MED ORDER — METOPROLOL TARTRATE 5 MG/5ML IV SOLN: 2.0000 mg | INTRAVENOUS | Status: DC | PRN

## 2016-12-03 MED ORDER — ORAL CARE MOUTH RINSE
15.0000 mL | Freq: Two times a day (BID) | OROMUCOSAL | Status: DC
  Administered 2016-12-03 – 2016-12-04 (×2): 15 mL via OROMUCOSAL

## 2016-12-03 SURGICAL SUPPLY — 68 items
BAG SNAP BAND KOVER 36X36 (MISCELLANEOUS) ×4 IMPLANT
CANISTER SUCT 3000ML PPV (MISCELLANEOUS) ×2 IMPLANT
CATH ANGIO 5F BER2 65CM (CATHETERS) ×2 IMPLANT
CATH BEACON 5.038 65CM KMP-01 (CATHETERS) IMPLANT
CATH CROSS OVER TEMPO 5F (CATHETERS) ×2 IMPLANT
CATH OMNI FLUSH .035X70CM (CATHETERS) ×2 IMPLANT
CATH QUICKCROSS SUPP .035X90CM (MICROCATHETER) ×2 IMPLANT
CATH SOFT-VU 4F 65 STRAIGHT (CATHETERS) ×1 IMPLANT
CATH SOFT-VU STRAIGHT 4F 65CM (CATHETERS) ×1
CATH VANSCHIE 4 5FR RDC 65CM (CATHETERS) ×1 IMPLANT
CLIP LIGATING EXTRA MED SLVR (CLIP) IMPLANT
CLIP LIGATING EXTRA SM BLUE (MISCELLANEOUS) IMPLANT
COIL NESTER 14X8 (Vascular Products) ×6 IMPLANT
COVER BACK TABLE 80X110 HD (DRAPES) ×2 IMPLANT
COVER DOME SNAP 22 D (MISCELLANEOUS) ×2 IMPLANT
COVER MAYO STAND STRL (DRAPES) ×2 IMPLANT
COVER PROBE W GEL 5X96 (DRAPES) ×4 IMPLANT
DERMABOND ADVANCED (GAUZE/BANDAGES/DRESSINGS) ×2
DERMABOND ADVANCED .7 DNX12 (GAUZE/BANDAGES/DRESSINGS) ×2 IMPLANT
DEVICE CLOSURE PERCLS PRGLD 6F (VASCULAR PRODUCTS) ×3 IMPLANT
DEVICE TORQUE KENDALL .025-038 (MISCELLANEOUS) ×2 IMPLANT
DRSG TEGADERM 2-3/8X2-3/4 SM (GAUZE/BANDAGES/DRESSINGS) ×4 IMPLANT
DRYSEAL FLEXSHEATH 12FR 33CM (SHEATH) ×1
ELECT REM PT RETURN 9FT ADLT (ELECTROSURGICAL) ×4
ELECTRODE REM PT RTRN 9FT ADLT (ELECTROSURGICAL) ×2 IMPLANT
GAUZE SPONGE 2X2 8PLY STRL LF (GAUZE/BANDAGES/DRESSINGS) ×1 IMPLANT
GLOVE BIO SURGEON STRL SZ7 (GLOVE) ×4 IMPLANT
GLOVE SS BIOGEL STRL SZ 7.5 (GLOVE) ×1 IMPLANT
GLOVE SUPERSENSE BIOGEL SZ 7.5 (GLOVE) ×1
GOWN STRL REUS W/ TWL LRG LVL3 (GOWN DISPOSABLE) ×3 IMPLANT
GOWN STRL REUS W/TWL LRG LVL3 (GOWN DISPOSABLE) ×3
GRAFT BALLN CATH 65CM (STENTS) ×1 IMPLANT
GUIDEWIRE ANGLED .035X150CM (WIRE) ×2 IMPLANT
GUIDEWIRE ANGLED .035X260CM (WIRE) ×2 IMPLANT
KIT BASIN OR (CUSTOM PROCEDURE TRAY) ×2 IMPLANT
KIT ROOM TURNOVER OR (KITS) ×2 IMPLANT
LEG CONTRALATERAL 16X14.5X14 (Vascular Products) ×2 IMPLANT
NEEDLE PERC 18GX7CM (NEEDLE) ×4 IMPLANT
NS IRRIG 1000ML POUR BTL (IV SOLUTION) ×2 IMPLANT
PACK ENDOVASCULAR (PACKS) ×2 IMPLANT
PAD ARMBOARD 7.5X6 YLW CONV (MISCELLANEOUS) ×4 IMPLANT
PERCLOSE PROGLIDE 6F (VASCULAR PRODUCTS) ×6
SHEATH AVANTI 11CM 5FR (MISCELLANEOUS) ×2 IMPLANT
SHEATH AVANTI 11CM 8FR (MISCELLANEOUS) ×2 IMPLANT
SHEATH BRITE TIP 8FR 23CM (MISCELLANEOUS) IMPLANT
SHEATH DRYSEAL FLEX 12FR 33CM (SHEATH) ×1 IMPLANT
SHEATH PINNACLE ST 6F 45CM (SHEATH) ×2 IMPLANT
SPONGE GAUZE 2X2 STER 10/PKG (GAUZE/BANDAGES/DRESSINGS) ×1
STAPLER VISISTAT 35W (STAPLE) IMPLANT
STENT GRAFT BALLN CATH 65CM (STENTS) ×1
STOPCOCK 4 WAY LG BORE MALE ST (IV SETS) ×2 IMPLANT
STOPCOCK MORSE 400PSI 3WAY (MISCELLANEOUS) ×4 IMPLANT
SUT ETHILON 3 0 PS 1 (SUTURE) IMPLANT
SUT PROLENE 5 0 C 1 24 (SUTURE) IMPLANT
SUT VIC AB 2-0 CTX 36 (SUTURE) IMPLANT
SUT VIC AB 3-0 SH 18 (SUTURE) IMPLANT
SUT VIC AB 3-0 SH 27 (SUTURE)
SUT VIC AB 3-0 SH 27X BRD (SUTURE) IMPLANT
SUT VICRYL 4-0 PS2 18IN ABS (SUTURE) ×2 IMPLANT
SYR 10ML LL (SYRINGE) ×4 IMPLANT
SYR 30ML LL (SYRINGE) ×2 IMPLANT
SYR MEDRAD MARK V 150ML (SYRINGE) ×2 IMPLANT
TRAY FOLEY W/METER SILVER 16FR (SET/KITS/TRAYS/PACK) ×2 IMPLANT
TUBING HIGH PRESSURE 120CM (CONNECTOR) ×4 IMPLANT
VANSCHIE 4 5FR RDC 65CM (CATHETERS) ×2
WIRE AMPLATZ SS-J .035X180CM (WIRE) ×2 IMPLANT
WIRE BENTSON .035X145CM (WIRE) ×4 IMPLANT
WIRE MINI STICK MAX (SHEATH) ×2 IMPLANT

## 2016-12-03 NOTE — Transfer of Care (Signed)
Immediate Anesthesia Transfer of Care Note  Patient: Brett Russell  Procedure(s) Performed: Procedure(s): INSERTION OF ILIAC STENT-LEFT and  hypogastric artery coliing (Left)  Patient Location: PACU  Anesthesia Type:General  Level of Consciousness: awake, alert , patient cooperative and confused  Airway & Oxygen Therapy: Patient Spontanous Breathing and Patient connected to nasal cannula oxygen  Post-op Assessment: Report given to RN and Post -op Vital signs reviewed and stable  Post vital signs: Reviewed and stable  Last Vitals:  Vitals:   12/03/16 0549  BP: 131/81  Pulse: 82  Resp: 20  Temp: 36.7 C    Last Pain:  Vitals:   12/03/16 0549  TempSrc: Oral      Patients Stated Pain Goal: 3 (54/27/06 2376)  Complications: No apparent anesthesia complications

## 2016-12-03 NOTE — Anesthesia Preprocedure Evaluation (Signed)
Anesthesia Evaluation    History of Anesthesia Complications Negative for: history of anesthetic complications  Airway Mallampati: II  TM Distance: >3 FB Neck ROM: Full    Dental  (+) Teeth Intact, Chipped,    Pulmonary neg shortness of breath, COPD, neg recent URI, Current Smoker,    breath sounds clear to auscultation       Cardiovascular hypertension, Pt. on medications (-) angina+ Peripheral Vascular Disease  (-) CAD and (-) Past MI  Rhythm:Regular     Neuro/Psych PSYCHIATRIC DISORDERS Anxiety negative neurological ROS     GI/Hepatic Neg liver ROS, GERD  Medicated and Controlled,  Endo/Other    Renal/GU Renal diseasePrevious kidney Ca removed, fxn stable     Musculoskeletal  (+) Arthritis ,   Abdominal   Peds  Hematology negative hematology ROS (+)   Anesthesia Other Findings   Reproductive/Obstetrics                             Anesthesia Physical Anesthesia Plan  ASA: II  Anesthesia Plan: General   Post-op Pain Management:    Induction: Intravenous  Airway Management Planned: Oral ETT  Additional Equipment: None  Intra-op Plan:   Post-operative Plan: Extubation in OR  Informed Consent: I have reviewed the patients History and Physical, chart, labs and discussed the procedure including the risks, benefits and alternatives for the proposed anesthesia with the patient or authorized representative who has indicated his/her understanding and acceptance.   Dental advisory given  Plan Discussed with: CRNA and Surgeon  Anesthesia Plan Comments:         Anesthesia Quick Evaluation

## 2016-12-03 NOTE — Anesthesia Procedure Notes (Signed)
Procedure Name: Intubation Date/Time: 12/03/2016 7:47 AM Performed by: Salli Quarry Hilliard Borges Pre-anesthesia Checklist: Patient identified, Emergency Drugs available, Suction available and Patient being monitored Patient Re-evaluated:Patient Re-evaluated prior to inductionOxygen Delivery Method: Circle System Utilized Preoxygenation: Pre-oxygenation with 100% oxygen Intubation Type: IV induction Ventilation: Mask ventilation without difficulty Laryngoscope Size: Mac and 4 Grade View: Grade I Tube type: Oral Tube size: 7.5 mm Number of attempts: 1 Airway Equipment and Method: Stylet Placement Confirmation: ETT inserted through vocal cords under direct vision,  positive ETCO2 and breath sounds checked- equal and bilateral Secured at: 22 cm Tube secured with: Tape Dental Injury: Teeth and Oropharynx as per pre-operative assessment

## 2016-12-03 NOTE — H&P (Signed)
Office Visit   10/26/2016 Vascular and Vein Specialists -Sharman Crate, MD  Vascular Surgery   Aneurysm of left iliac artery [facility])  Dx   Re-evaluation ; Referred by Sinda Du, MD  Reason for Visit   Additional Documentation   Vitals:   BP 135/78 (BP Location: Left Arm, Patient Position: Sitting, Cuff Size: Normal)   Pulse 96   Temp 97.6 F (36.4 C) (Oral)   Resp 18   Ht 5\' 8"  (1.727 m)   Wt 186 lb 8 oz (84.6 kg)   SpO2 95%   BMI 28.36 kg/m   BSA 2.01 m      More Vitals   Flowsheets:   Infectious Disease Screening,   Healthcare Directives,   Clinical Intake,   Custom Formula Data,   Anthropometrics,   Vital Signs,   MEWS Score     Encounter Info:   Billing Info,   History,   Allergies,   Detailed Report     All Notes   Progress Notes by Alvia Grove, PA-C at 10/26/2016 3:00 PM   Author: Alvia Grove, PA-C Author Type: Physician Assistant Certified Filed: 1/94/1740 4:21 PM  Note Status: Signed Cosign: Cosign Not Required Encounter Date: 10/26/2016  Editor: Rosetta Posner, MD (Physician)  Prior Versions: 1. Ansel Bong (Physician Assistant Certified) at 03/01/4817 3:56 PM - Sign at close encounter      Vascular and Vein Specialist of [facility]  Patient name: Brett Russell       MRN: [id number]        DOB: 01-29-1946          Sex: male  REASON FOR VISIT: Follow-up iliac artery aneurysm  HPI: Brett Russell is a 71 y.o. male who presents for 18 month follow-up of his left common iliac artery aneurysm. He previously underwent open abdominal aortic aneurysm repair by Dr. Kellie Simmering in 2007 with a tube graft. His last CT scan 18 months ago revealed a 2.7 cm left common iliac artery aneurysm.   His past medical history includes hypertension, Parkinson's, gout and anxiety. He has a ventral hernia. He previously underwent open appendectomy. His dad had an AAA as well. Previously underwent partial nephrectomy of the left  kidney for malignant tumor. He continues to smoke.      Past Medical History:  Diagnosis Date  . AAA (abdominal aortic aneurysm) ([place])   . Anxiety   . Aortic aneurysm ([place]) 2007  . GERD (gastroesophageal reflux disease)   . Gout   . Hypertension   . Parkinson disease ([place]) 2006  . Renal cancer ([place]) 09/2005   Left Kidney         Family History  Problem Relation Age of Onset  . Kidney disease Father   . Heart disease Father   . AAA (abdominal aortic aneurysm) Father   . Healthy Brother   . Healthy Daughter   . Constipation Daughter   . Healthy Daughter     SOCIAL HISTORY:       Social History  Substance Use Topics  . Smoking status: Current Every Day Smoker    Packs/day: 1.00    Years: 45.00    Types: Cigarettes    Start date: 08/11/1969  . Smokeless tobacco: Never Used     Comment: Patient states that he smoke 1ppd,he has smoked since age 68  . Alcohol use No    Allergies  Allergen Reactions  . Morphine And Related  Current Outpatient Prescriptions  Medication Sig Dispense Refill  . acetaminophen (TYLENOL) 325 MG tablet Take 650 mg by mouth as needed for pain.    Marland Kitchen allopurinol (ZYLOPRIM) 300 MG tablet Take 300 mg by mouth daily.    Marland Kitchen ALPRAZolam (XANAX) 1 MG tablet Take 0.5-1 mg by mouth 2 (two) times daily. 1 in morning and 0.5 at bedtime.    Marland Kitchen amLODipine (NORVASC) 10 MG tablet Take 10 mg by mouth daily.    . carbidopa-levodopa (SINEMET IR) 25-100 MG per tablet Take 1 tablet by mouth 2 (two) times daily.    Marland Kitchen docusate sodium (COLACE) 100 MG capsule Take 2 capsules (200 mg total) by mouth at bedtime. 1 capsule 0  . Melatonin 1 MG TABS Take 1 tablet by mouth at bedtime as needed (sleep).     Marland Kitchen omeprazole (PRILOSEC) 20 MG capsule Take 20 mg by mouth daily.    . ondansetron (ZOFRAN) 4 MG tablet Take 4 mg by mouth as needed for nausea.     No current facility-administered medications for this visit.      REVIEW OF SYSTEMS:  [X]  denotes positive finding, [ ]  denotes negative finding Cardiac  Comments:  Chest pain or chest pressure:    Shortness of breath upon exertion: x   Short of breath when lying flat:    Irregular heart rhythm:        Vascular    Pain in calf, thigh, or hip brought on by ambulation:    Pain in feet at night that wakes you up from your sleep:     Blood clot in your veins:    Leg swelling:         Pulmonary    Oxygen at home:    Productive cough:  x   Wheezing:         Neurologic    Sudden weakness in arms or legs:     Sudden numbness in arms or legs:     Sudden onset of difficulty speaking or slurred speech:    Temporary loss of vision in one eye:     Problems with dizziness:         Gastrointestinal    Blood in stool:     Vomited blood:         Genitourinary    Burning when urinating:     Blood in urine:        Psychiatric    Major depression:         Hematologic    Bleeding problems:    Problems with blood clotting too easily:        Skin    Rashes or ulcers:        Constitutional    Fever or chills:      PHYSICAL EXAM:    Vitals:   10/26/16 1500  BP: 135/78  Pulse: 96  Resp: 18  Temp: 97.6 F (36.4 C)  TempSrc: Oral  SpO2: 95%  Weight: 186 lb 8 oz (84.6 kg)  Height: 5\' 8"  (1.727 m)    GENERAL: The patient is a well-nourished male, in no acute distress. The vital signs are documented above. HEENT: normocephalic, atraumatic. No abnormalities noted.  CARDIAC: There is a regular rate and rhythm. No carotid bruits. VASCULAR: 2+ femoral, 2+ Rosales pedis and 2+ posterior tibial pulses bilaterally. PULMONARY: There is good air exchange bilaterally without wheezing or rales. ABDOMEN: Soft and non-tender. Ventral hernia. No pulsatile mass. MUSCULOSKELETAL: There are no major deformities or cyanosis.  NEUROLOGIC: No focal weakness  or paresthesias are detected. SKIN: There are no ulcers or rashes noted. PSYCHIATRIC: The patient has a normal affect.  DATA:  CTA abd/pelv 10/11/16  Infrarenal aortic graft is stable. Left common iliac artery aneurysm now measures 3.3 cm. Left internal iliac disease is patent. Left common femoral artery is widely patent.   MEDICAL ISSUES: Left common iliac artery aneurysm  There is an interval enlargement of the left common iliac artery aneurysm to 3.3 cm from 2.7 cm 18 months ago. patient has a prior history of abdominal aortic aneurysm repair with a tube graft in 2007. Given the progression of his aneurysm, would recommend surgical intervention. He is likely an endovascular candidate. Dr. Donnetta Hutching to discuss with his partners and with device representative. The patient will be scheduled for left common iliac artery aneurysm repair in the near future.  Virgina Jock, PA-C [facility] Specialists of [facility] MD: Early  Extensive discussion with the patient. He has had continued dilatation of his left common iliac artery below his tube graft. At his young age and good health would recommend elective repair. Explained that he should be able to be treated with endovascular means. This in all likelihood will require coiling of his hypogastric artery. We'll review his films and make recommendations.    Instructions   Addendum:  The patient has been re-examined and re-evaluated.  The patient's history and physical has been reviewed and is unchanged.    SABA NEUMAN is a 71 y.o. male is being admitted with Left Iliac Artery Aneurysm I72.3. All the risks, benefits and other treatment options have been discussed with the patient. The patient has consented to proceed with Procedure(s): INSERTION OF ILIAC STENT-LEFT, possible hypogastric artery coliing as a surgical intervention.  Curt Jews 12/03/2016 7:25 AM Vascular and Vein Surgery

## 2016-12-03 NOTE — Progress Notes (Signed)
Responded to consult to create advanced directive. Explained options/process - that it could be done Monday at [facility] or taken w/ pt to notarize elsewhere. Provided emotional support. Pt will review information and decide what he wishes to do. Chaplain available for f/u.    12/03/16 1800  Clinical Encounter Type  Visited With Patient;Health care provider  Visit Type Initial;Psychological support;Spiritual support;Social support  Referral From Nurse  Spiritual Encounters  Spiritual Needs Brochure;Emotional  Stress Factors  Patient Stress Factors Health changes;Loss of control   Gerrit Heck, Chaplain

## 2016-12-03 NOTE — Op Note (Signed)
    OPERATIVE REPORT  DATE OF SURGERY: 12/03/2016  PATIENT: Brett Russell, 71 y.o. male MRN: [id number]  DOB: 1946/01/13  PRE-OPERATIVE DIAGNOSIS: Left common iliac artery aneurysm  POST-OPERATIVE DIAGNOSIS:  Same  PROCEDURE: Coiling and embolization of left internal iliac artery, iliac extension and from bifurcation to external iliac with a 16 x 14 x 14 cm Gore stent graft  SURGEON:  Curt Jews, M.D.  PHYSICIAN ASSISTANT: Nurse  ANESTHESIA:  Gen.  EBL: 50 ml  Total I/O In: 1500 [I.V.:1500] Out: 825 [Urine:775; Blood:50]  BLOOD ADMINISTERED: None  DRAINS: None  SPECIMEN: None  COUNTS CORRECT:  YES  PLAN OF CARE: PACU   PATIENT DISPOSITION:  PACU - hemodynamically stable  PROCEDURE DETAILS: Patient was taken to the operating placed supine position where the area both groins prepped in sterile fashion. The right common femoral artery was entered with an 18-gauge needle under ultrasound visualization the guidewire was passed centrally. 5 French sheath was passed over the guidewire. A sauce catheter was used to attempt access to the left iliac artery. The guidewire would not pass into the left iliac artery and a crossover catheter was used which did allow access to the iliac artery. This was then exchanged for a Glidewire which was manipulation was able to be passed down into the left iliac. There is a very sharp angle at the bifurcation and an endhole catheter resulted in pushing the guidewire out of the left iliac system WERE tried with a crossover catheter as well and these all resulted in pushing the wire up into the aorta. For this reason the left common femoral artery was entered with an 18-gauge again using SonoSite visualization. A guidewire was passed centrally and a crossover catheter was used to access the internal iliac artery on the left. This was then advanced down into the internal iliac artery. 3 separate 8 mm Nester coils were packed in the orifice of the internal  iliac artery on the left. Next the existing sheath was exchanged for a 12 French dry she'll sheath and this was positioned above the aortic bifurcation. A 16 x 14 x 14 cm limb was positioned through the dry she'll sheath and the sheath was withdrawn below this. Injection through the right sheath revealed the location of the bifurcation and the appropriate overlap. The iliac limb was deployed and 02/17/1949 balloon was used to gently dilate the proximal and distal insertion sites. A pigtail catheter was repositioned above the bifurcation and the final injection revealed excellent positioning of the stent graft and no flow into the left common iliac artery aneurysm. There was excellent flow in the right internal iliac artery with good cross-filling over to the level of coiling. The patient had been given 7000 units of intravenous heparin prior to placing the 12 French sheath. The right groin had placement of a single Perclose device over the guidewire after the guidewire was removed and Perclose was deployed and there was good hemostasis. On the left there had been a 2 Perclose technique placed at the initial positioning of the guidewire and the 12 French sheath was removed these 2 Perclose is were deployed giving again excellent hemostasis. The puncture sites had a Dermabond placed over these. Patient continued to have 2+ pedal pulses and was transferred to the recovery room in stable condition   Rosetta Posner, M.D., [facility] 12/03/2016 2:17 PM

## 2016-12-03 NOTE — Anesthesia Postprocedure Evaluation (Addendum)
Anesthesia Post Note  Patient: Brett Russell  Procedure(s) Performed: Procedure(s) (LRB): INSERTION OF LEFT COMMON  ILIAC STENT-LEFT INTERNAL ILIAC  ARTERY COILING (Left)  Patient location during evaluation: PACU Anesthesia Type: General Level of consciousness: awake and alert Pain management: pain level controlled Vital Signs Assessment: post-procedure vital signs reviewed and stable Respiratory status: spontaneous breathing, nonlabored ventilation, respiratory function stable and patient connected to nasal cannula oxygen Cardiovascular status: blood pressure returned to baseline and stable Postop Assessment: no signs of nausea or vomiting Anesthetic complications: no       Last Vitals:  Vitals:   12/03/16 1208 12/03/16 1629  BP: 100/63 109/75  Pulse: 77 75  Resp: (!) 24 19  Temp: 36.4 C 36.5 C    Last Pain:  Vitals:   12/03/16 1629  TempSrc: Oral                 Latamara Melder

## 2016-12-04 LAB — CBC
HCT: 45.7 % (ref 39.0–52.0)
Hemoglobin: 14.9 g/dL (ref 13.0–17.0)
MCH: 30.5 pg (ref 26.0–34.0)
MCHC: 32.6 g/dL (ref 30.0–36.0)
MCV: 93.5 fL (ref 78.0–100.0)
Platelets: 200 10*3/uL (ref 150–400)
RBC: 4.89 MIL/uL (ref 4.22–5.81)
RDW: 13.4 % (ref 11.5–15.5)
WBC: 15.3 10*3/uL — ABNORMAL HIGH (ref 4.0–10.5)

## 2016-12-04 LAB — BASIC METABOLIC PANEL
ANION GAP: 6 (ref 5–15)
BUN: 10 mg/dL (ref 6–20)
CHLORIDE: 102 mmol/L (ref 101–111)
CO2: 29 mmol/L (ref 22–32)
Calcium: 8.6 mg/dL — ABNORMAL LOW (ref 8.9–10.3)
Creatinine, Ser: 0.83 mg/dL (ref 0.61–1.24)
GFR calc Af Amer: >60 mL/min (ref >60)
GFR calc non Af Amer: >60 mL/min (ref >60)
GLUCOSE: 112 mg/dL — AB (ref 65–99)
POTASSIUM: 3.7 mmol/L (ref 3.5–5.1)
Sodium: 137 mmol/L (ref 135–145)

## 2016-12-04 NOTE — Care Management Note (Signed)
Case Management Note  Patient Details  Name: Brett Russell MRN: [id number] Date of Birth: Jun 19, 1946  Subjective/Objective:                 Patient with order to DC tohome. No CM consults, orders or needs identified at this time.    Action/Plan:  DC to home.  Expected Discharge Date:  12/04/16               Expected Discharge Plan:  Home/Self Care  In-House Referral:     Discharge planning Services  CM Consult  Post Acute Care Choice:    Choice offered to:     DME Arranged:    DME Agency:     HH Arranged:    HH Agency:     Status of Service:  Completed, signed off  If discussed at [employer] of Stay Meetings, dates discussed:    Additional Comments:  Carles Collet, RN 12/04/2016, 7:59 AM

## 2016-12-04 NOTE — Progress Notes (Addendum)
[facility] [place]  Subjective  - Doing well voided, eaten, and ambulated.   Objective 115/69 90 98 F (36.7 C) (Oral) 20 92%  Intake/Output Summary (Last 24 hours) at 12/04/16 0740 Last data filed at 12/04/16 0330  Gross per 24 hour  Intake          2595.83 ml  Output             2100 ml  Net           495.83 ml    Palpable PT B Groins soft without hematoma or erythema Heart RRR Lung non labored breathing  Assessment/Planning: POD # 1 Coiling and embolization of left internal iliac artery, iliac extension and from bifurcation to external iliac with a 16 x 14 x 14 cm Gore stent graft  Contraindication to anti platelets with past bleeding episodes on 81 mg mg aspirin encourage IS Incisions are clean and dry with out signs of infection. Stable for discharge home  Laurence Slate [facility] 12/04/2016 7:40 AM   Addendum  I have independently interviewed and examined the patient, and I agree with the physician assistant's findings.  Ok to D/C  Adele Barthel, MD, FACS [facility] [place] Office: [phone] Pager: [phone]  12/04/2016, 9:10 AM   --  Laboratory Lab Results:  Recent Labs  12/01/16 0935 12/04/16 0307  WBC 9.5 15.3*  HGB 17.4* 14.9  HCT 51.4 45.7  PLT 202 200   BMET  Recent Labs  12/01/16 0935 12/04/16 0307  NA 139 137  K 3.8 3.7  CL 100* 102  CO2 31 29  GLUCOSE 103* 112*  BUN 7 10  CREATININE 0.68 0.83  CALCIUM 9.4 8.6*    COAG Lab Results  Component Value Date   INR 0.91 12/01/2016   No results found for: PTT

## 2016-12-05 DIAGNOSIS — Z48812 Encounter for surgical aftercare following surgery on the circulatory system: Secondary | ICD-10-CM | POA: Diagnosis not present

## 2016-12-05 DIAGNOSIS — F17200 Nicotine dependence, unspecified, uncomplicated: Secondary | ICD-10-CM | POA: Diagnosis not present

## 2016-12-05 DIAGNOSIS — I10 Essential (primary) hypertension: Secondary | ICD-10-CM | POA: Diagnosis not present

## 2016-12-05 DIAGNOSIS — G20 Parkinson's disease: Secondary | ICD-10-CM | POA: Diagnosis not present

## 2016-12-06 ENCOUNTER — Encounter (HOSPITAL_COMMUNITY): Payer: Self-pay | Admitting: Vascular Surgery

## 2016-12-06 NOTE — Discharge Summary (Signed)
[facility] Discharge Summary   Patient ID:  Brett Russell MRN: [id number] DOB/AGE: 1946/04/27 71 y.o.  Admit date: 12/03/2016 Discharge date: 12/06/2016 Date of Surgery: 12/03/2016 Surgeon: Juliann Mule): Early, [name], MD  Admission Diagnosis: Left Iliac Artery Aneurysm I72.3  Discharge Diagnoses:  Left Iliac Artery Aneurysm I72.3  Secondary Diagnoses: Past Medical History:  Diagnosis Date  . AAA (abdominal aortic aneurysm) ([place])   . Allergy    takes Allegra daily  . Anxiety    takes Xanax nightly  . Aortic aneurysm ([place]) 2007  . Arthritis   . COPD (chronic obstructive pulmonary disease) ([place])   . Enlarged prostate    slightly  . GERD (gastroesophageal reflux disease)    takes Omeprazole daily  . Gout    takes Allopurinol nightly  . History of bronchitis    many yrs ago  . History of colon polyps    benign  . History of kidney stones    hx of  . Hypertension    takes Amlodipine daily  . Insomnia    takes Melatonin nightly  . Macular degeneration    wet-right eye .Injection of AvAStin every 10 wks  . Parkinson disease ([place]) 2006   takes Sinemet daily  . 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 He previously underwent open abdominal aortic aneurysm repair by Dr. Kellie Simmering in 2007 with a tube graft. His last CT scan 18 months ago revealed a 2.7 cm left common iliac artery aneurysm.   His past medical history includes hypertension, Parkinson's, goutand anxiety. He has a ventral hernia. He previously underwent open appendectomy. His dad had an AAA as well. Previously underwent partial nephrectomy of the left kidney for malignant tumor. He continues to smoke.  DATA: CTA abd/pelv  10/11/16  Infrarenal aortic graft is stable. Left common iliac artery aneurysm now measures 3.3 cm. Left internal iliac disease is patent. Left common femoral artery is widely patent.  Hospital Course:  Brett Russell is a 71 y.o. male is S/P  Procedure(s): INSERTION OF LEFT COMMON  ILIAC STENT-LEFT INTERNAL ILIAC  ARTERY COILING  Palpable PT B Groins soft without hematoma or erythema Heart RRR Lung non labored breathing  Assessment/Planning: POD # 1 Coiling and embolization of left internal iliac artery, iliac extension and from bifurcation to external iliac with a 16 x 14 x 14 cm Gore stent graft  Contraindication to anti platelets with past bleeding episodes on 81 mg mg aspirin encourage IS Incisions are clean and dry with out signs of infection. Stable for discharge home CTA Abd/Pelvis in 1 month prior to f/u with Dr. Donnetta Hutching   Significant Diagnostic Studies: CBC Lab Results  Component Value Date   WBC 15.3 (H) 12/04/2016   HGB 14.9 12/04/2016   HCT 45.7 12/04/2016   MCV 93.5 12/04/2016   PLT 200 12/04/2016    BMET    Component Value Date/Time   NA 137 12/04/2016 0307   K 3.7 12/04/2016 0307   CL 102 12/04/2016 0307   CO2 29 12/04/2016 0307   GLUCOSE 112 (H) 12/04/2016 0307   BUN 10 12/04/2016 0307   CREATININE 0.83 12/04/2016 0307   CREATININE 0.77 03/21/2015 1613   CALCIUM 8.6 (L) 12/04/2016 1025  GFRNONAA >60 12/04/2016 0307   GFRAA >60 12/04/2016 0307   COAG Lab Results  Component Value Date   INR 0.91 12/01/2016     Disposition:  Discharge to :Home Discharge Instructions    Call MD for:  redness, tenderness, or signs of infection (pain, swelling, bleeding, redness, odor or green/yellow discharge around incision site)    Complete by:  As directed    Call MD for:  severe or increased pain, loss or decreased feeling  in affected limb(s)    Complete by:  As directed    Call MD for:  temperature >100.5    Complete by:  As directed    Discharge  instructions    Complete by:  As directed    You may remove thew dressings and shower in 24 hours   Driving Restrictions    Complete by:  As directed    No driving for 1 week   Lifting restrictions    Complete by:  As directed    No heavy lifting for 2-3 weeks   Resume previous diet    Complete by:  As directed      Allergies as of 12/04/2016      Reactions   Asa [aspirin] Other (See Comments)   Severe nosebleeds in the past.    Morphine And Related    Hallucinations/attempt to elope from the hospital      Medication List    TAKE these medications   allopurinol 300 MG tablet Commonly known as:  ZYLOPRIM Take 300 mg by mouth at bedtime.   ALPRAZolam 1 MG tablet Commonly known as:  XANAX Take 0.5-1 mg by mouth 2 (two) times daily. 1 in morning and 0.5 at bedtime.   amLODipine 10 MG tablet Commonly known as:  NORVASC Take 10 mg by mouth at bedtime.   carbidopa-levodopa 25-100 MG tablet Commonly known as:  SINEMET IR Take 1 tablet by mouth 2 (two) times daily.   docusate sodium 100 MG capsule Commonly known as:  COLACE Take 2 capsules (200 mg total) by mouth at bedtime. What changed:  how much to take  when to take this   fexofenadine 180 MG tablet Commonly known as:  ALLEGRA Take 180 mg by mouth daily at 2 PM.   Krill Oil 350 MG Caps Take 350 mg by mouth daily.   Melatonin 1 MG Tabs Take 1 tablet by mouth at bedtime as needed (sleep).   omeprazole 20 MG capsule Commonly known as:  PRILOSEC Take 20 mg by mouth daily.   ondansetron 4 MG tablet Commonly known as:  ZOFRAN Take 4 mg by mouth 2 (two) times daily as needed for nausea.      Verbal and written Discharge instructions given to the patient. Wound care per Discharge AVS Follow-up Information    Early, [name], MD Follow up in 4 week(s).   Specialties:  Vascular Surgery, Cardiology Why:  office will call for appointment Contact information: [place] [address] [phone]            Signed: Laurence Slate [facility] 12/06/2016, 9:19 AM

## 2016-12-07 DIAGNOSIS — Z48812 Encounter for surgical aftercare following surgery on the circulatory system: Secondary | ICD-10-CM | POA: Diagnosis not present

## 2016-12-07 DIAGNOSIS — I10 Essential (primary) hypertension: Secondary | ICD-10-CM | POA: Diagnosis not present

## 2016-12-07 DIAGNOSIS — F17200 Nicotine dependence, unspecified, uncomplicated: Secondary | ICD-10-CM | POA: Diagnosis not present

## 2016-12-07 DIAGNOSIS — G20 Parkinson's disease: Secondary | ICD-10-CM | POA: Diagnosis not present

## 2016-12-07 NOTE — Addendum Note (Signed)
Addended by: Lianne Cure A on: 12/07/2016 01:00 PM   Modules accepted: Orders

## 2016-12-10 DIAGNOSIS — G20 Parkinson's disease: Secondary | ICD-10-CM | POA: Diagnosis not present

## 2016-12-10 DIAGNOSIS — Z48812 Encounter for surgical aftercare following surgery on the circulatory system: Secondary | ICD-10-CM | POA: Diagnosis not present

## 2016-12-10 DIAGNOSIS — F17200 Nicotine dependence, unspecified, uncomplicated: Secondary | ICD-10-CM | POA: Diagnosis not present

## 2016-12-10 DIAGNOSIS — I10 Essential (primary) hypertension: Secondary | ICD-10-CM | POA: Diagnosis not present

## 2016-12-15 DIAGNOSIS — G20 Parkinson's disease: Secondary | ICD-10-CM | POA: Diagnosis not present

## 2016-12-15 DIAGNOSIS — F17200 Nicotine dependence, unspecified, uncomplicated: Secondary | ICD-10-CM | POA: Diagnosis not present

## 2016-12-15 DIAGNOSIS — Z48812 Encounter for surgical aftercare following surgery on the circulatory system: Secondary | ICD-10-CM | POA: Diagnosis not present

## 2016-12-15 DIAGNOSIS — I10 Essential (primary) hypertension: Secondary | ICD-10-CM | POA: Diagnosis not present

## 2016-12-17 DIAGNOSIS — F17200 Nicotine dependence, unspecified, uncomplicated: Secondary | ICD-10-CM | POA: Diagnosis not present

## 2016-12-17 DIAGNOSIS — Z48812 Encounter for surgical aftercare following surgery on the circulatory system: Secondary | ICD-10-CM | POA: Diagnosis not present

## 2016-12-17 DIAGNOSIS — I10 Essential (primary) hypertension: Secondary | ICD-10-CM | POA: Diagnosis not present

## 2016-12-17 DIAGNOSIS — G20 Parkinson's disease: Secondary | ICD-10-CM | POA: Diagnosis not present

## 2016-12-20 NOTE — Addendum Note (Signed)
Addendum  created 12/20/16 1534 by Andy Moye, MD   Sign clinical note    

## 2016-12-21 DIAGNOSIS — Z48812 Encounter for surgical aftercare following surgery on the circulatory system: Secondary | ICD-10-CM | POA: Diagnosis not present

## 2016-12-21 DIAGNOSIS — F17200 Nicotine dependence, unspecified, uncomplicated: Secondary | ICD-10-CM | POA: Diagnosis not present

## 2016-12-21 DIAGNOSIS — G20 Parkinson's disease: Secondary | ICD-10-CM | POA: Diagnosis not present

## 2016-12-21 DIAGNOSIS — I10 Essential (primary) hypertension: Secondary | ICD-10-CM | POA: Diagnosis not present

## 2016-12-23 DIAGNOSIS — I10 Essential (primary) hypertension: Secondary | ICD-10-CM | POA: Diagnosis not present

## 2016-12-23 DIAGNOSIS — Z48812 Encounter for surgical aftercare following surgery on the circulatory system: Secondary | ICD-10-CM | POA: Diagnosis not present

## 2016-12-23 DIAGNOSIS — G20 Parkinson's disease: Secondary | ICD-10-CM | POA: Diagnosis not present

## 2016-12-23 DIAGNOSIS — F17200 Nicotine dependence, unspecified, uncomplicated: Secondary | ICD-10-CM | POA: Diagnosis not present

## 2016-12-24 DIAGNOSIS — M545 Low back pain: Secondary | ICD-10-CM | POA: Diagnosis not present

## 2016-12-24 DIAGNOSIS — I10 Essential (primary) hypertension: Secondary | ICD-10-CM | POA: Diagnosis not present

## 2016-12-24 DIAGNOSIS — I739 Peripheral vascular disease, unspecified: Secondary | ICD-10-CM | POA: Diagnosis not present

## 2016-12-24 DIAGNOSIS — J449 Chronic obstructive pulmonary disease, unspecified: Secondary | ICD-10-CM | POA: Diagnosis not present

## 2016-12-27 ENCOUNTER — Encounter: Payer: Self-pay | Admitting: Vascular Surgery

## 2016-12-29 ENCOUNTER — Encounter: Payer: Self-pay | Admitting: Family

## 2016-12-29 ENCOUNTER — Ambulatory Visit (INDEPENDENT_AMBULATORY_CARE_PROVIDER_SITE_OTHER): Payer: Medicare Other | Admitting: Family

## 2016-12-29 VITALS — BP 134/76 | HR 93 | Temp 97.6°F | Resp 18 | Ht 68.0 in | Wt 182.6 lb

## 2016-12-29 DIAGNOSIS — H353211 Exudative age-related macular degeneration, right eye, with active choroidal neovascularization: Secondary | ICD-10-CM | POA: Diagnosis not present

## 2016-12-29 DIAGNOSIS — I723 Aneurysm of iliac artery: Secondary | ICD-10-CM

## 2016-12-29 DIAGNOSIS — Z95828 Presence of other vascular implants and grafts: Secondary | ICD-10-CM | POA: Diagnosis not present

## 2016-12-29 DIAGNOSIS — F17200 Nicotine dependence, unspecified, uncomplicated: Secondary | ICD-10-CM

## 2016-12-29 NOTE — Progress Notes (Signed)
Postoperative Visit   History of Present Illness  Brett Russell is a 71 y.o. male who is s/p coiling and embolization of left internal iliac artery, iliac extension and from bifurcation to external iliac with a 16 x 14 x 14 cm Gore stent graft on 12-03-16 by Dr. Donnetta Hutching for left common iliac artery aneurysm.   He returns today with c/o intermittent burning sensation in right thigh after he bent over to tie his shoe and sneezed about a week ago. Bending or standing may bring on the burning sensation, or may not.  He denies right groin or scrotal pain.  HH was visiting twice/week until last week.   He walks a mile daily. He had polio as a child, states his legs have been slightly weaker since this. He also has Parkinson's Disease, diagnosed in 2006 and well controlled.  He had no claudication symptoms prior to the above procedure. The patient is able to complete their activities of daily living.     For VQI Use Only  PRE-ADM LIVING: Home  AMB STATUS: Ambulatory  Past Medical History:  Diagnosis Date  . AAA (abdominal aortic aneurysm) ([place])   . Allergy    takes Allegra daily  . Anxiety    takes Xanax nightly  . Aortic aneurysm ([place]) 2007  . Arthritis   . COPD (chronic obstructive pulmonary disease) ([place])   . Enlarged prostate    slightly  . GERD (gastroesophageal reflux disease)    takes Omeprazole daily  . Gout    takes Allopurinol nightly  . History of bronchitis    many yrs ago  . History of colon polyps    benign  . History of kidney stones    hx of  . Hypertension    takes Amlodipine daily  . Insomnia    takes Melatonin nightly  . Macular degeneration    wet-right eye .Injection of AvAStin every 10 wks  . Parkinson disease ([place]) 2006   takes Sinemet daily  . 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 ABDOMINAL AORTIC ANEURYSM REPAIR  2007  . ABDOMINAL AORTIC ENDOVASCULAR STENT GRAFT  Left 12/03/2016   Procedure: INSERTION OF LEFT COMMON  ILIAC STENT-LEFT INTERNAL ILIAC  ARTERY COILING;  Surgeon: Rosetta Posner, MD;  Location: [facility];  Service: Vascular;  Laterality: Left;  . APPENDECTOMY    . CHOLECYSTECTOMY    . COLONOSCOPY N/A 11/29/2012   Procedure: COLONOSCOPY;  Surgeon: Rogene Houston, MD;  [facility];  Service: Endoscopy;  Laterality: N/A;  1200-moved to [place] notified pt  . KIDNEY SURGERY  2007   Tumor removed  . Right knee     Cartilage removed at age 49    Social History   Social History  . Marital status: Divorced    Spouse name: N/A  . Number of children: N/A  . Years of education: N/A   Occupational History  . Not on file.   Social History Main Topics  . Smoking status: Current Every Day Smoker    Packs/day: 1.00    Years: 49.00    Types: Cigarettes    Start date: 08/11/1969  . Smokeless tobacco: Never Used  . Alcohol use No  . Drug use: No  . Sexual activity: Not on file   Other Topics Concern  . Not on file   Social History Narrative  .  No narrative on file    Allergies  Allergen Reactions  . Asa [Aspirin] Other (See Comments)    Severe nosebleeds in the past.   . Morphine And Related     Hallucinations/attempt to elope from the hospital    Current Outpatient Prescriptions on File Prior to Visit  Medication Sig Dispense Refill  . allopurinol (ZYLOPRIM) 300 MG tablet Take 300 mg by mouth at bedtime.     . ALPRAZolam (XANAX) 1 MG tablet Take 0.5-1 mg by mouth 2 (two) times daily. 1 in morning and 0.5 at bedtime.    Marland Kitchen amLODipine (NORVASC) 10 MG tablet Take 10 mg by mouth at bedtime.     . carbidopa-levodopa (SINEMET IR) 25-100 MG per tablet Take 1 tablet by mouth 2 (two) times daily.    Marland Kitchen docusate sodium (COLACE) 100 MG capsule Take 2 capsules (200 mg total) by mouth at bedtime. (Patient taking differently: Take 100 mg by mouth every other day. ) 1 capsule 0  . fexofenadine (ALLEGRA) 180 MG tablet Take 180 mg by mouth  daily at 2 PM.    . Krill Oil 350 MG CAPS Take 350 mg by mouth daily.    . Melatonin 1 MG TABS Take 1 tablet by mouth at bedtime as needed (sleep).     Marland Kitchen omeprazole (PRILOSEC) 20 MG capsule Take 20 mg by mouth daily.    . ondansetron (ZOFRAN) 4 MG tablet Take 4 mg by mouth 2 (two) times daily as needed for nausea.      No current facility-administered medications on file prior to visit.     Physical Examination  Vitals:   12/29/16 1356  BP: 134/76  Pulse: 93  Resp: 18  Temp: 97.6 F (36.4 C)  TempSrc: Oral  SpO2: 94%  Weight: 182 lb 9.6 oz (82.8 kg)  Height: 5\' 8"  (1.727 m)   Body mass index is 27.76 kg/m.  PHYSICAL EXAMINATION: General: The patient appears their stated age.   HEENT:  No gross abnormalities Pulmonary: Respirations are non-labored Abdomen: Soft and non-tender. No groin swelling, no scrotal swelling, no thigh swelling or tenderness.  Musculoskeletal: There are no major deformities.   Neurologic: No focal weakness or paresthesias are detected Skin: There are no ulcer or rashes noted. Psychiatric: The patient has normal affect. Cardiovascular: There is a regular rate and rhythm.  Vascular: Vessel Right Left  Radial Palpable Palpable  Aorta Not palpable N/A  Femoral Palpable Palpable  Popliteal  palpable  palpable  PT  Palpable  Palpable  DP  Palpable  Palpable    Medical Decision Making  Brett Russell is a 71 y.o. male who presents s/p who is s/p coiling and embolization of left internal iliac artery, iliac extension and from bifurcation to external iliac with a 16 x 14 x 14 cm Gore stent graft on 12-03-16.  I discussed with Dr. Scot Dock pt HPI, physical exam results, and procedure performed on 12-03-16.  Pt's symptoms are unrelated to this procedure, follow up with his PCP.  The patient was counseled re smoking cessation and given several free resources re smoking cessation.   Follow up with Dr. Donnetta Hutching as already scheduled on 01-04-17 with CTA  abd/pelvis.    Thank you for allowing [place] to participate in this patient's care.  NICKEL, Sharmon Leyden, RN, MSN, FNP-C [facility] [place] Office: [phone]  12/29/2016, 2:01 PM  Clinic MD: Carloyn Jaeger

## 2016-12-29 NOTE — Patient Instructions (Signed)
Steps to Quit Smoking Smoking tobacco can be bad for your health. It can also affect almost every organ in your body. Smoking puts you and people around you at risk for many serious long-lasting (chronic) diseases. Quitting smoking is hard, but it is one of the best things that you can do for your health. It is never too late to quit. What are the benefits of quitting smoking? When you quit smoking, you lower your risk for getting serious diseases and conditions. They can include:  Lung cancer or lung disease.  Heart disease.  Stroke.  Heart attack.  Not being able to have children (infertility).  Weak bones (osteoporosis) and broken bones (fractures).  If you have coughing, wheezing, and shortness of breath, those symptoms may get better when you quit. You may also get sick less often. If you are pregnant, quitting smoking can help to lower your chances of having a baby of low birth weight. What can I do to help me quit smoking? Talk with your doctor about what can help you quit smoking. Some things you can do (strategies) include:  Quitting smoking totally, instead of slowly cutting back how much you smoke over a period of time.  Going to in-person counseling. You are more likely to quit if you go to many counseling sessions.  Using resources and support systems, such as: ? Online chats with a counselor. ? Phone quitlines. ? Printed self-help materials. ? Support groups or group counseling. ? Text messaging programs. ? Mobile phone apps or applications.  Taking medicines. Some of these medicines may have nicotine in them. If you are pregnant or breastfeeding, do not take any medicines to quit smoking unless your doctor says it is okay. Talk with your doctor about counseling or other things that can help you.  Talk with your doctor about using more than one strategy at the same time, such as taking medicines while you are also going to in-person counseling. This can help make  quitting easier. What things can I do to make it easier to quit? Quitting smoking might feel very hard at first, but there is a lot that you can do to make it easier. Take these steps:  Talk to your family and friends. Ask them to support and encourage you.  Call phone quitlines, reach out to support groups, or work with a counselor.  Ask people who smoke to not smoke around you.  Avoid places that make you want (trigger) to smoke, such as: ? Bars. ? Parties. ? Smoke-break areas at work.  Spend time with people who do not smoke.  Lower the stress in your life. Stress can make you want to smoke. Try these things to help your stress: ? Getting regular exercise. ? Deep-breathing exercises. ? Yoga. ? Meditating. ? Doing a body scan. To do this, close your eyes, focus on one area of your body at a time from head to toe, and notice which parts of your body are tense. Try to relax the muscles in those areas.  Download or buy apps on your mobile phone or tablet that can help you stick to your quit plan. There are many free apps, such as QuitGuide from the CDC (Centers for Disease Control and Prevention). You can find more support from smokefree.gov and other websites.  This information is not intended to replace advice given to you by your health care provider. Make sure you discuss any questions you have with your health care provider. Document Released: 05/01/2009 Document   Revised: 03/02/2016 Document Reviewed: 11/19/2014 Elsevier Interactive Patient Education  2018 Elsevier Inc.  

## 2016-12-31 DIAGNOSIS — M79671 Pain in right foot: Secondary | ICD-10-CM | POA: Diagnosis not present

## 2016-12-31 DIAGNOSIS — Q828 Other specified congenital malformations of skin: Secondary | ICD-10-CM | POA: Diagnosis not present

## 2017-01-04 ENCOUNTER — Ambulatory Visit
Admission: RE | Admit: 2017-01-04 | Discharge: 2017-01-04 | Disposition: A | Discharge status: Home / Self Care | Payer: Medicare Other | Source: Ambulatory Visit | Attending: Vascular Surgery | Admitting: Vascular Surgery

## 2017-01-04 ENCOUNTER — Encounter: Payer: Self-pay | Admitting: Vascular Surgery

## 2017-01-04 ENCOUNTER — Ambulatory Visit (INDEPENDENT_AMBULATORY_CARE_PROVIDER_SITE_OTHER): Payer: Self-pay | Admitting: Vascular Surgery

## 2017-01-04 VITALS — BP 131/75 | HR 96 | Temp 98.1°F | Resp 16 | Ht 68.0 in | Wt 184.6 lb

## 2017-01-04 DIAGNOSIS — I723 Aneurysm of iliac artery: Secondary | ICD-10-CM

## 2017-01-04 DIAGNOSIS — Z95828 Presence of other vascular implants and grafts: Secondary | ICD-10-CM | POA: Diagnosis not present

## 2017-01-04 MED ORDER — IOPAMIDOL (ISOVUE-370) INJECTION 76%
75.0000 mL | Freq: Once | INTRAVENOUS | Status: AC | PRN
  Administered 2017-01-04: 75 mL via INTRAVENOUS

## 2017-01-04 NOTE — Progress Notes (Signed)
Per Dr. Donnetta Hutching, I called patient and discussed the need for him to see Dr. Diona Fanti to investigate this 1.6 cm mass in his left kidney. Patient has history of Partial left nephrectomy in 2007 by Dr. Diona Fanti for kidney cancer. Will ask our appt staff to make him an appt with Dr. Perley Jain asap.

## 2017-01-04 NOTE — Progress Notes (Signed)
   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 male here for follow-up. Undergone open aneurysm repair by Dr. Kellie Simmering in the remote past. 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 He underwent uneventful repair of this with a left iliac extension with a Gore excluder limb. Also had coiling of his left hypogastric artery at the same setting. Had a diagnostic sheath groin puncture on the right common femoral artery. He reports that week ago he felt a popping sensation in his right groin when he sneezed and bent over he has had some tenderness in his medial thigh and this is slowly resolving. No bruising.  Current Outpatient Prescriptions  Medication Sig Dispense Refill  . allopurinol (ZYLOPRIM) 300 MG tablet Take 300 mg by mouth at bedtime.     . ALPRAZolam (XANAX) 1 MG tablet Take 0.5-1 mg by mouth 2 (two) times daily. 1 in morning and 0.5 at bedtime.    Marland Kitchen amLODipine (NORVASC) 10 MG tablet Take 10 mg by mouth at bedtime.     . Bevacizumab (AVASTIN IV) Inject 3.75 mg/mL into the vein.    . carbidopa-levodopa (SINEMET IR) 25-100 MG per tablet Take 1 tablet by mouth 2 (two) times daily.    Marland Kitchen docusate sodium (COLACE) 100 MG capsule Take 2 capsules (200 mg total) by mouth at bedtime. (Patient taking differently: Take 100 mg by mouth every other day. ) 1 capsule 0  . fexofenadine (ALLEGRA) 180 MG tablet Take 180 mg by mouth daily at 2 PM.    . Krill Oil 350 MG CAPS Take 350 mg by mouth daily.    . Melatonin 1 MG TABS Take 1 tablet by mouth at bedtime as needed (sleep).     Marland Kitchen omeprazole (PRILOSEC) 20 MG capsule Take 20 mg by mouth daily.    . ondansetron (ZOFRAN) 4 MG tablet Take 4 mg by mouth 2 (two) times daily as needed for nausea.      No current  facility-administered medications for this visit.      PHYSICAL EXAM: Vitals:   01/04/17 1427  BP: 131/75  Pulse: 96  Resp: 16  Temp: 98.1 F (36.7 C)  TempSrc: Oral  SpO2: 90%  Weight: 184 lb 9.6 oz (83.7 kg)  Height: 5\' 8"  (1.727 m)    GENERAL: The patient is a well-nourished male, in no acute distress. The vital signs are documented above. 2+ femoral pulses and 2+ popliteal pulses bilaterally with no evidence of hematoma or false aneurysm   CT scan from today was reviewed. This does show successful treatment of the left common iliac artery aneurysm. There is no flow into the aneurysm sac. There is no evidence of hematoma or other abnormality in the right or left groin.  There is a lesion in his left kidney which is felt to be concerning by the reviewing radiologist. He has an established patient of Dr.Dahlstedt.  We will refer him back for urologic evaluation and follow-up.   MEDICAL ISSUES: I will see him again in one year with ultrasound of his aneurysm repair   Rosetta Posner, MD [facility] Tel [phone] Pager [phone]

## 2017-01-05 ENCOUNTER — Telehealth: Payer: Self-pay | Admitting: Vascular Surgery

## 2017-01-05 NOTE — Telephone Encounter (Signed)
-----   Message from Mena Goes, RN sent at 01/04/2017  3:44 PM EDT ----- Please make him an appt with Dr. Diona Fanti for followup on today's CTA findings.

## 2017-01-05 NOTE — Telephone Encounter (Signed)
Called dr. Alan Ripper office [phone]. Spoke to nurse Roselyn Reef. She is sending a message to the MD to try to get the pt a sooner appt.

## 2017-01-11 NOTE — Addendum Note (Signed)
Addended by: Lianne Cure A on: 01/11/2017 04:00 PM   Modules accepted: Orders

## 2017-01-28 DIAGNOSIS — D49512 Neoplasm of unspecified behavior of left kidney: Secondary | ICD-10-CM | POA: Diagnosis not present

## 2017-02-04 DIAGNOSIS — I714 Abdominal aortic aneurysm, without rupture: Secondary | ICD-10-CM | POA: Diagnosis not present

## 2017-02-04 DIAGNOSIS — M791 Myalgia: Secondary | ICD-10-CM | POA: Diagnosis not present

## 2017-02-04 DIAGNOSIS — I10 Essential (primary) hypertension: Secondary | ICD-10-CM | POA: Diagnosis not present

## 2017-02-04 DIAGNOSIS — J449 Chronic obstructive pulmonary disease, unspecified: Secondary | ICD-10-CM | POA: Diagnosis not present

## 2017-02-04 DIAGNOSIS — I739 Peripheral vascular disease, unspecified: Secondary | ICD-10-CM | POA: Diagnosis not present

## 2017-03-18 DIAGNOSIS — I739 Peripheral vascular disease, unspecified: Secondary | ICD-10-CM | POA: Diagnosis not present

## 2017-03-18 DIAGNOSIS — J449 Chronic obstructive pulmonary disease, unspecified: Secondary | ICD-10-CM | POA: Diagnosis not present

## 2017-03-18 DIAGNOSIS — M545 Low back pain: Secondary | ICD-10-CM | POA: Diagnosis not present

## 2017-03-18 DIAGNOSIS — I10 Essential (primary) hypertension: Secondary | ICD-10-CM | POA: Diagnosis not present

## 2017-03-22 ENCOUNTER — Ambulatory Visit (HOSPITAL_COMMUNITY)
Admission: RE | Admit: 2017-03-22 | Discharge: 2017-03-22 | Disposition: A | Discharge status: Home / Self Care | Payer: Medicare Other | Source: Ambulatory Visit | Attending: Pulmonary Disease | Admitting: Pulmonary Disease

## 2017-03-22 ENCOUNTER — Other Ambulatory Visit (HOSPITAL_COMMUNITY): Payer: Self-pay | Admitting: Pulmonary Disease

## 2017-03-22 DIAGNOSIS — M25551 Pain in right hip: Secondary | ICD-10-CM | POA: Diagnosis not present

## 2017-03-22 DIAGNOSIS — M160 Bilateral primary osteoarthritis of hip: Secondary | ICD-10-CM | POA: Diagnosis not present

## 2017-03-22 DIAGNOSIS — M25552 Pain in left hip: Secondary | ICD-10-CM | POA: Diagnosis not present

## 2017-03-22 DIAGNOSIS — M545 Low back pain: Secondary | ICD-10-CM | POA: Diagnosis not present

## 2017-03-22 DIAGNOSIS — M5136 Other intervertebral disc degeneration, lumbar region: Secondary | ICD-10-CM | POA: Insufficient documentation

## 2017-03-23 DIAGNOSIS — H353121 Nonexudative age-related macular degeneration, left eye, early dry stage: Secondary | ICD-10-CM | POA: Diagnosis not present

## 2017-03-23 DIAGNOSIS — H353211 Exudative age-related macular degeneration, right eye, with active choroidal neovascularization: Secondary | ICD-10-CM | POA: Diagnosis not present

## 2017-03-23 DIAGNOSIS — H43811 Vitreous degeneration, right eye: Secondary | ICD-10-CM | POA: Diagnosis not present

## 2017-04-08 DIAGNOSIS — M79671 Pain in right foot: Secondary | ICD-10-CM | POA: Diagnosis not present

## 2017-04-08 DIAGNOSIS — Q828 Other specified congenital malformations of skin: Secondary | ICD-10-CM | POA: Diagnosis not present

## 2017-06-15 DIAGNOSIS — H353211 Exudative age-related macular degeneration, right eye, with active choroidal neovascularization: Secondary | ICD-10-CM | POA: Diagnosis not present

## 2017-07-07 DIAGNOSIS — Z0000 Encounter for general adult medical examination without abnormal findings: Secondary | ICD-10-CM | POA: Diagnosis not present

## 2017-07-07 DIAGNOSIS — Z23 Encounter for immunization: Secondary | ICD-10-CM | POA: Diagnosis not present

## 2017-07-15 DIAGNOSIS — B353 Tinea pedis: Secondary | ICD-10-CM | POA: Diagnosis not present

## 2017-07-15 DIAGNOSIS — Q828 Other specified congenital malformations of skin: Secondary | ICD-10-CM | POA: Diagnosis not present

## 2017-07-15 DIAGNOSIS — M79673 Pain in unspecified foot: Secondary | ICD-10-CM | POA: Diagnosis not present

## 2017-07-25 DIAGNOSIS — Z1211 Encounter for screening for malignant neoplasm of colon: Secondary | ICD-10-CM | POA: Diagnosis not present

## 2017-07-26 DIAGNOSIS — D3002 Benign neoplasm of left kidney: Secondary | ICD-10-CM | POA: Diagnosis not present

## 2017-08-05 DIAGNOSIS — M79673 Pain in unspecified foot: Secondary | ICD-10-CM | POA: Diagnosis not present

## 2017-08-05 DIAGNOSIS — Q828 Other specified congenital malformations of skin: Secondary | ICD-10-CM | POA: Diagnosis not present

## 2017-08-05 DIAGNOSIS — B353 Tinea pedis: Secondary | ICD-10-CM | POA: Diagnosis not present

## 2017-08-30 DIAGNOSIS — M79671 Pain in right foot: Secondary | ICD-10-CM | POA: Diagnosis not present

## 2017-08-30 DIAGNOSIS — Q828 Other specified congenital malformations of skin: Secondary | ICD-10-CM | POA: Diagnosis not present

## 2017-09-27 DIAGNOSIS — M79671 Pain in right foot: Secondary | ICD-10-CM | POA: Diagnosis not present

## 2017-09-27 DIAGNOSIS — Q828 Other specified congenital malformations of skin: Secondary | ICD-10-CM | POA: Diagnosis not present

## 2017-09-28 DIAGNOSIS — H43811 Vitreous degeneration, right eye: Secondary | ICD-10-CM | POA: Diagnosis not present

## 2017-09-28 DIAGNOSIS — H353211 Exudative age-related macular degeneration, right eye, with active choroidal neovascularization: Secondary | ICD-10-CM | POA: Diagnosis not present

## 2017-09-28 DIAGNOSIS — H353121 Nonexudative age-related macular degeneration, left eye, early dry stage: Secondary | ICD-10-CM | POA: Diagnosis not present

## 2017-10-28 DIAGNOSIS — Q828 Other specified congenital malformations of skin: Secondary | ICD-10-CM | POA: Diagnosis not present

## 2017-10-28 DIAGNOSIS — M79672 Pain in left foot: Secondary | ICD-10-CM | POA: Diagnosis not present

## 2017-10-28 DIAGNOSIS — M779 Enthesopathy, unspecified: Secondary | ICD-10-CM | POA: Diagnosis not present

## 2017-11-08 DIAGNOSIS — G20 Parkinson's disease: Secondary | ICD-10-CM | POA: Diagnosis not present

## 2017-11-08 DIAGNOSIS — J301 Allergic rhinitis due to pollen: Secondary | ICD-10-CM | POA: Diagnosis not present

## 2017-11-08 DIAGNOSIS — I739 Peripheral vascular disease, unspecified: Secondary | ICD-10-CM | POA: Diagnosis not present

## 2017-11-08 DIAGNOSIS — R600 Localized edema: Secondary | ICD-10-CM | POA: Diagnosis not present

## 2017-11-10 ENCOUNTER — Encounter (INDEPENDENT_AMBULATORY_CARE_PROVIDER_SITE_OTHER): Payer: Self-pay | Admitting: *Deleted

## 2017-12-19 DIAGNOSIS — I10 Essential (primary) hypertension: Secondary | ICD-10-CM | POA: Diagnosis not present

## 2017-12-19 DIAGNOSIS — F419 Anxiety disorder, unspecified: Secondary | ICD-10-CM | POA: Diagnosis not present

## 2017-12-19 DIAGNOSIS — I739 Peripheral vascular disease, unspecified: Secondary | ICD-10-CM | POA: Diagnosis not present

## 2017-12-19 DIAGNOSIS — G20 Parkinson's disease: Secondary | ICD-10-CM | POA: Diagnosis not present

## 2017-12-21 DIAGNOSIS — H353211 Exudative age-related macular degeneration, right eye, with active choroidal neovascularization: Secondary | ICD-10-CM | POA: Diagnosis not present

## 2017-12-30 DIAGNOSIS — M79672 Pain in left foot: Secondary | ICD-10-CM | POA: Diagnosis not present

## 2017-12-30 DIAGNOSIS — R609 Edema, unspecified: Secondary | ICD-10-CM | POA: Diagnosis not present

## 2017-12-30 DIAGNOSIS — Q828 Other specified congenital malformations of skin: Secondary | ICD-10-CM | POA: Diagnosis not present

## 2018-01-10 ENCOUNTER — Ambulatory Visit (INDEPENDENT_AMBULATORY_CARE_PROVIDER_SITE_OTHER): Payer: Medicare Other | Admitting: Vascular Surgery

## 2018-01-10 ENCOUNTER — Encounter: Payer: Self-pay | Admitting: Vascular Surgery

## 2018-01-10 ENCOUNTER — Ambulatory Visit (HOSPITAL_COMMUNITY)
Admission: RE | Admit: 2018-01-10 | Discharge: 2018-01-10 | Disposition: A | Discharge status: Home / Self Care | Payer: Medicare Other | Source: Ambulatory Visit | Attending: Vascular Surgery | Admitting: Vascular Surgery

## 2018-01-10 VITALS — BP 130/72 | HR 91 | Temp 96.6°F | Ht 68.0 in | Wt 180.0 lb

## 2018-01-10 DIAGNOSIS — I723 Aneurysm of iliac artery: Secondary | ICD-10-CM

## 2018-01-10 NOTE — Progress Notes (Signed)
[facility] [place]  Patient name: Brett Russell MRN: [id number] DOB: 02-Nov-1945 Sex: male  REASON FOR VISIT: [place] up prior common iliac artery aneurysm treatment  HPI: Brett Russell is a 72 y.o. male here today for follow-up.  He is doing quite well.  He reports mild hip discomfort with walking but reports this continues to improve.  He has had no evidence of peripheral arterial disease  Past Medical History:  Diagnosis Date  . AAA (abdominal aortic aneurysm) ([place])   . Allergy    takes Allegra daily  . Anxiety    takes Xanax nightly  . Aortic aneurysm ([place]) 2007  . Arthritis   . COPD (chronic obstructive pulmonary disease) ([place])   . Enlarged prostate    slightly  . GERD (gastroesophageal reflux disease)    takes Omeprazole daily  . Gout    takes Allopurinol nightly  . History of bronchitis    many yrs ago  . History of colon polyps    benign  . History of kidney stones    hx of  . Hypertension    takes Amlodipine daily  . Insomnia    takes Melatonin nightly  . Macular degeneration    wet-right eye .Injection of AvAStin every 10 wks  . Parkinson disease ([place]) 2006   takes Sinemet daily  . 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 Kidney disease Father   . Heart disease Father   . AAA (abdominal aortic aneurysm) Father   . Healthy Brother   . Healthy Daughter   . Constipation Daughter   . Healthy Daughter     SOCIAL HISTORY: Social History   Tobacco Use  . Smoking status: Current Every Day Smoker    Packs/day: 1.00    Years: 49.00    Pack years: 49.00    Types: Cigarettes    Start date: 08/11/1969  . Smokeless tobacco: Never Used  Substance Use Topics  . Alcohol use: No    Alcohol/week: 0.0 oz    Allergies  Allergen Reactions  . Asa [Aspirin] Other (See Comments)    Severe nosebleeds in the past.   . Morphine And  Related     Hallucinations/attempt to elope from the hospital    Current Outpatient Medications  Medication Sig Dispense Refill  . allopurinol (ZYLOPRIM) 300 MG tablet Take 300 mg by mouth at bedtime.     . ALPRAZolam (XANAX) 1 MG tablet Take 0.5-1 mg by mouth 2 (two) times daily. 1 in morning and 0.5 at bedtime.    Marland Kitchen amLODipine (NORVASC) 10 MG tablet Take 10 mg by mouth at bedtime.     . Bevacizumab (AVASTIN IV) Inject 3.75 mg/mL into the vein.    . carbidopa-levodopa (SINEMET IR) 25-100 MG per tablet Take 1 tablet by mouth 2 (two) times daily.    Marland Kitchen docusate sodium (COLACE) 100 MG capsule Take 2 capsules (200 mg total) by mouth at bedtime. (Patient taking differently: Take 100 mg by mouth every other day. ) 1 capsule 0  . fexofenadine (ALLEGRA) 180 MG tablet Take 180 mg by mouth daily at 2 PM.    . Melatonin 1 MG TABS Take 1 tablet by mouth at bedtime as needed (sleep).     Marland Kitchen omeprazole (PRILOSEC) 20 MG capsule Take 20 mg by mouth daily.    . ondansetron (ZOFRAN) 4 MG  tablet Take 4 mg by mouth 2 (two) times daily as needed for nausea.     Javier Docker Oil 350 MG CAPS Take 350 mg by mouth daily.     No current facility-administered medications for this visit.     REVIEW OF SYSTEMS:  [X]  denotes positive finding, [ ]  denotes negative finding Cardiac  Comments:  Chest pain or chest pressure:    Shortness of breath upon exertion:    Short of breath when lying flat:    Irregular heart rhythm:        Vascular    Pain in calf, thigh, or hip brought on by ambulation:    Pain in feet at night that wakes you up from your sleep:     Blood clot in your veins:    Leg swelling:           PHYSICAL EXAM: Vitals:   01/10/18 1008  BP: 130/72  Pulse: 91  Temp: (!) 96.6 F (35.9 C)  TempSrc: Oral  SpO2: 95%  Weight: 180 lb (81.6 kg)  Height: 5\' 8"  (1.727 m)    GENERAL: The patient is a well-nourished male, in no acute distress. The vital signs are documented above. CARDIOVASCULAR: 2+  femoral and 2+ dorsalis pedis pulses bilaterally PULMONARY: There is good air exchange  MUSCULOSKELETAL: There are no major deformities or cyanosis. NEUROLOGIC: No focal weakness or paresthesias are detected. SKIN: There are no ulcers or rashes noted. PSYCHIATRIC: The patient has a normal affect.  DATA:  Duplex today shows common iliac artery patency with left aneurysm sac itself measuring 3.3 cm  MEDICAL ISSUES: Table overall.  He will continue his usual activities.  Will be seen again in 1 year with repeat noninvasive studies    Rosetta Posner, MD [facility] Tel [phone] Pager [phone]

## 2018-02-10 DIAGNOSIS — M79672 Pain in left foot: Secondary | ICD-10-CM | POA: Diagnosis not present

## 2018-02-10 DIAGNOSIS — Q828 Other specified congenital malformations of skin: Secondary | ICD-10-CM | POA: Diagnosis not present

## 2018-02-16 ENCOUNTER — Other Ambulatory Visit: Payer: Self-pay

## 2018-03-08 DIAGNOSIS — L570 Actinic keratosis: Secondary | ICD-10-CM | POA: Diagnosis not present

## 2018-03-08 DIAGNOSIS — X32XXXA Exposure to sunlight, initial encounter: Secondary | ICD-10-CM | POA: Diagnosis not present

## 2018-03-08 DIAGNOSIS — D225 Melanocytic nevi of trunk: Secondary | ICD-10-CM | POA: Diagnosis not present

## 2018-03-08 DIAGNOSIS — L821 Other seborrheic keratosis: Secondary | ICD-10-CM | POA: Diagnosis not present

## 2018-03-14 DIAGNOSIS — I739 Peripheral vascular disease, unspecified: Secondary | ICD-10-CM | POA: Diagnosis not present

## 2018-03-14 DIAGNOSIS — B356 Tinea cruris: Secondary | ICD-10-CM | POA: Diagnosis not present

## 2018-03-14 DIAGNOSIS — I10 Essential (primary) hypertension: Secondary | ICD-10-CM | POA: Diagnosis not present

## 2018-03-14 DIAGNOSIS — J449 Chronic obstructive pulmonary disease, unspecified: Secondary | ICD-10-CM | POA: Diagnosis not present

## 2018-03-15 DIAGNOSIS — H353121 Nonexudative age-related macular degeneration, left eye, early dry stage: Secondary | ICD-10-CM | POA: Diagnosis not present

## 2018-03-15 DIAGNOSIS — H353211 Exudative age-related macular degeneration, right eye, with active choroidal neovascularization: Secondary | ICD-10-CM | POA: Diagnosis not present

## 2018-03-15 DIAGNOSIS — H43811 Vitreous degeneration, right eye: Secondary | ICD-10-CM | POA: Diagnosis not present

## 2018-03-24 DIAGNOSIS — M79672 Pain in left foot: Secondary | ICD-10-CM | POA: Diagnosis not present

## 2018-03-24 DIAGNOSIS — Q828 Other specified congenital malformations of skin: Secondary | ICD-10-CM | POA: Diagnosis not present

## 2018-05-11 IMAGING — CT CT ABD-PELV W/ CM
2 of 5 series · 16 of 46 positions shown, 18 images · IV contrast (APPLIED)
Comparison: 03/25/2015

CLINICAL DATA: Generalized abdominal pain for 2 weeks

EXAM:
CT ABDOMEN AND PELVIS WITH CONTRAST
TECHNIQUE: Multidetector CT imaging of the abdomen and pelvis was performed
using the standard protocol following bolus administration of
intravenous contrast.
CONTRAST:  100mL B3MLEK-PMM IOPAMIDOL (B3MLEK-PMM) INJECTION 61%

[Series 2: axial st · axial · 0.75mm/px · z∈[-218,+172]mm · 13 of 88 slices shown, 15 images]
[im 5/88  soft-tissue]
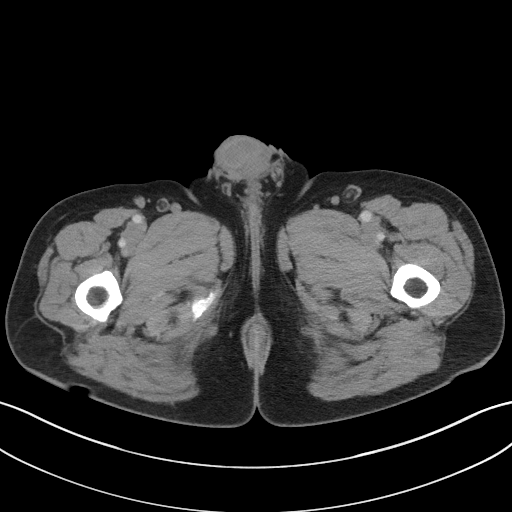
[im 5/88  bone]
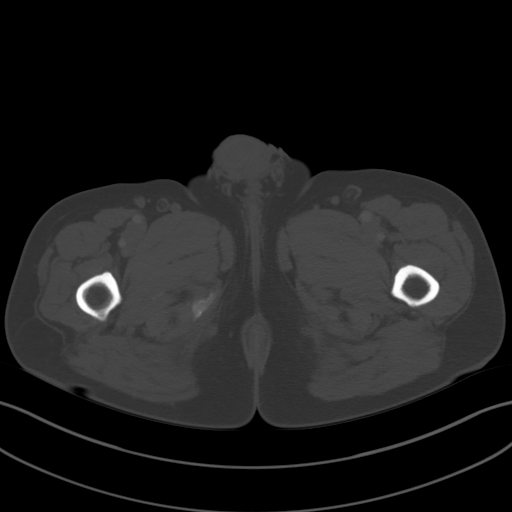
[im 10/88  soft-tissue]
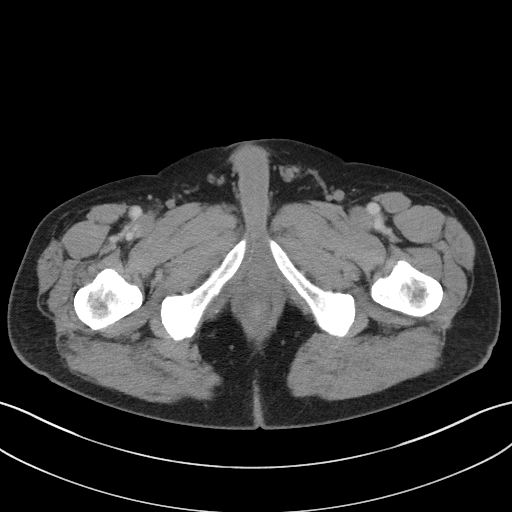
[im 20/88  soft-tissue]
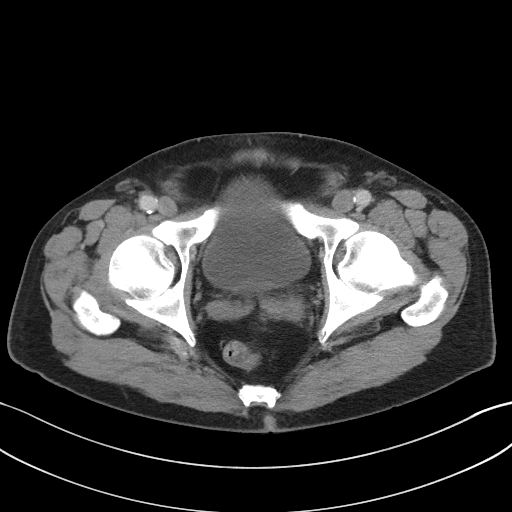
[im 25/88  soft-tissue]
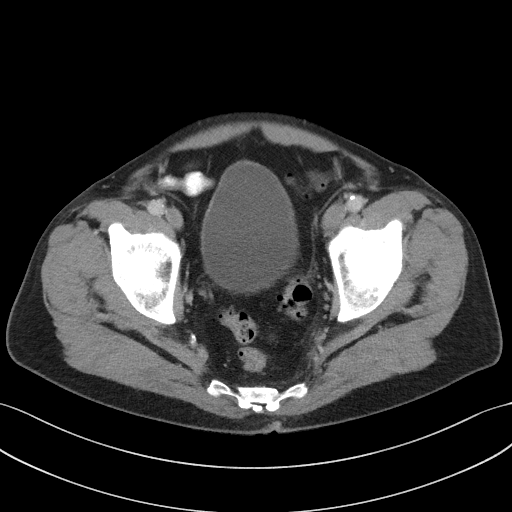
[im 30/88  soft-tissue]
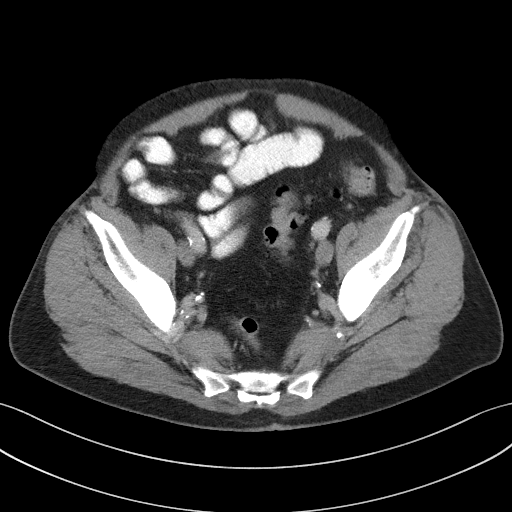
[im 39/88  soft-tissue]
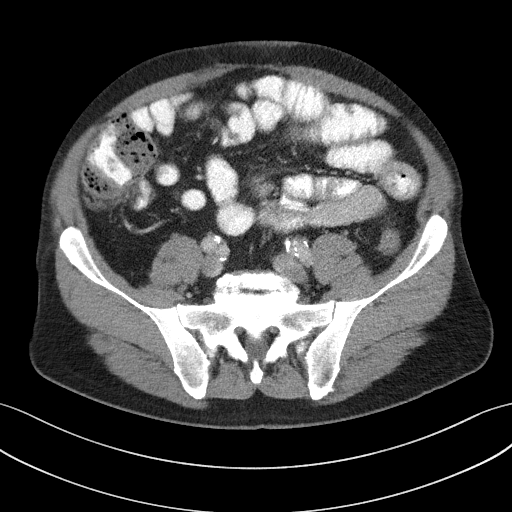
[im 44/88  soft-tissue]
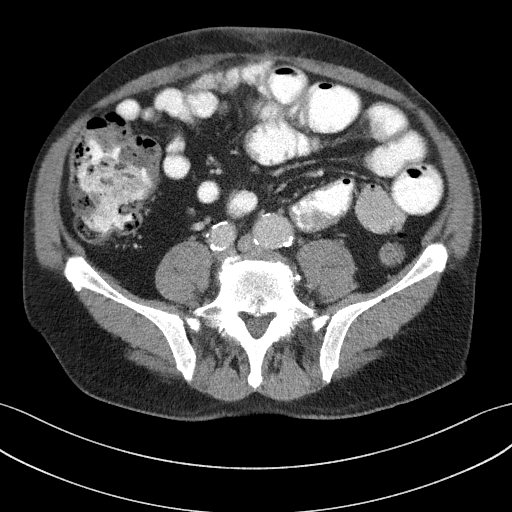
[im 49/88  soft-tissue]
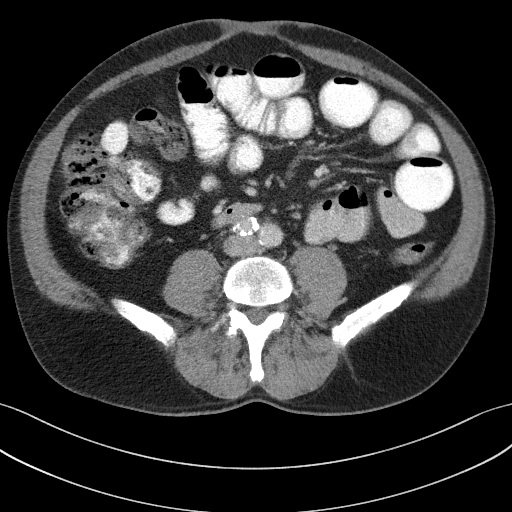
[im 59/88  soft-tissue]
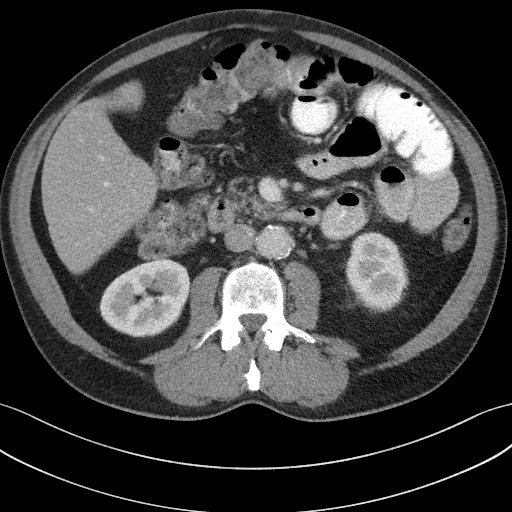
[im 59/88  bone]
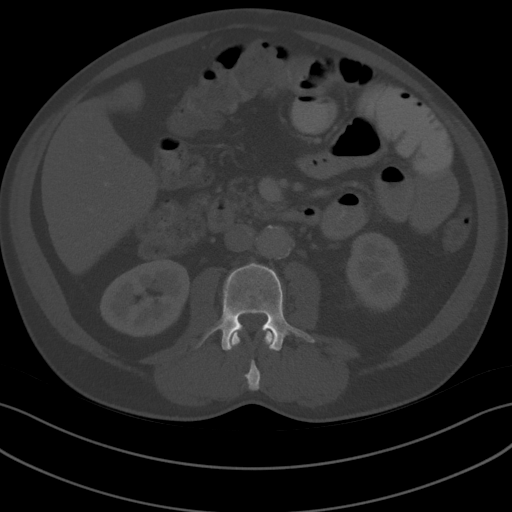
[im 63/88  soft-tissue]
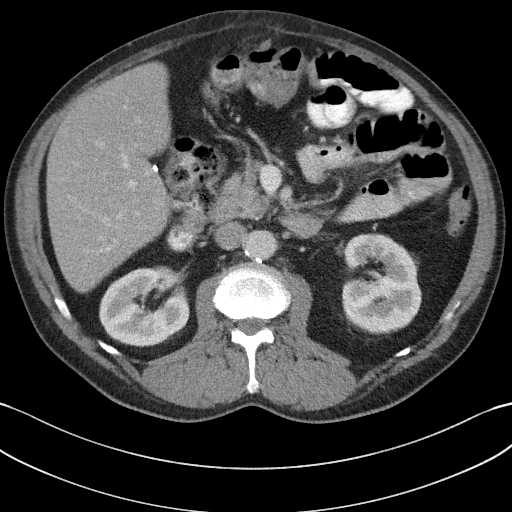
[im 68/88  soft-tissue]
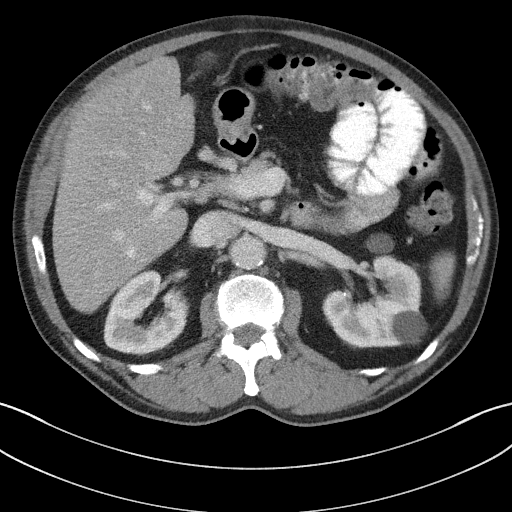
[im 78/88  soft-tissue]
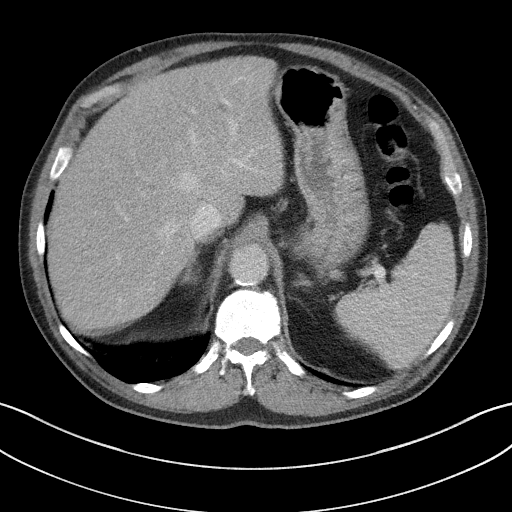
[im 83/88  soft-tissue]
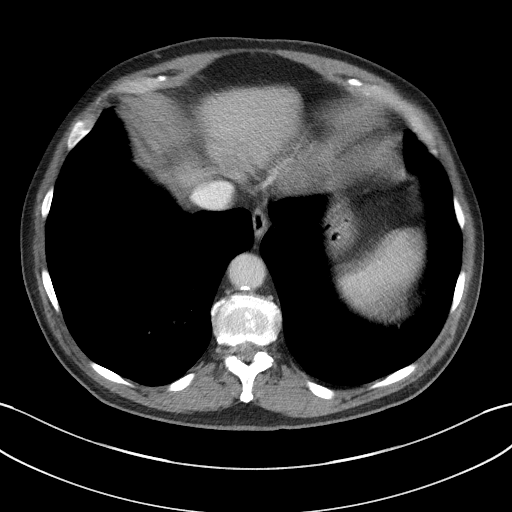

[Series 4: coronal st · coronal · 0.75mm/px · 3 of 107 slices shown]
[im 36/107  soft-tissue]
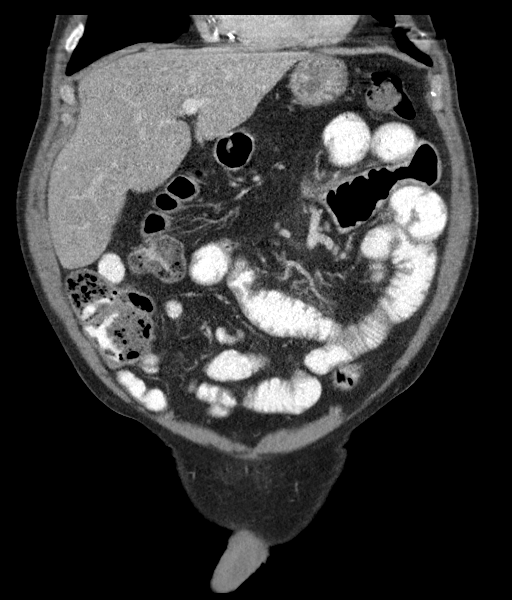
[im 48/107  soft-tissue]
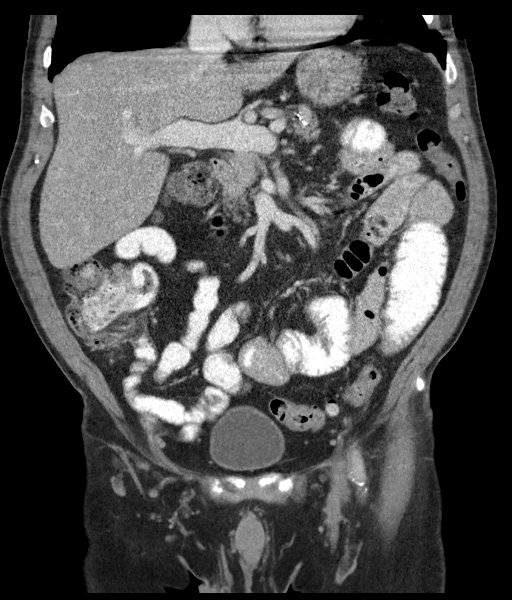
[im 59/107  soft-tissue]
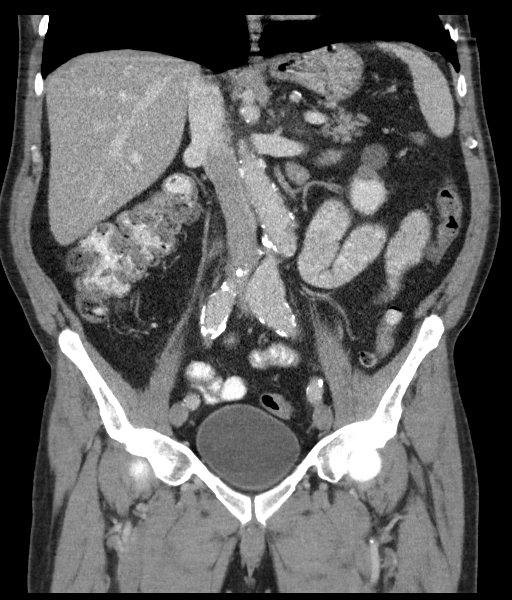

[16 of 46 positions shown; findings below may reference images not displayed]

FINDINGS: Lower chest: Mild lingular scarring.

Hepatobiliary: No focal liver abnormality is seen. Status post
cholecystectomy. No biliary dilatation.

Pancreas: Unremarkable. No pancreatic ductal dilatation or
surrounding inflammatory changes.

Spleen: No splenic injury or perisplenic hematoma.

Adrenals/Urinary Tract: Adrenal glands are unremarkable. No
urolithiasis or obstructive uropathy. Multiple hypodense, fluid
attenuating left renal mass is again noted most consistent with
cysts with the largest left inferior pole renal cyst measuring
cm. Bladder is unremarkable.

Stomach/Bowel: Stomach is within normal limits. Diverticulosis
without evidence of diverticulitis. No evidence of bowel wall
thickening, distention, or inflammatory changes.

Vascular/Lymphatic: Abdominal aortic atherosclerosis. Normal caliber
abdominal aorta. Left common iliac artery aneurysm measuring 2.9 cm
in short axis. Right common iliac artery aneurysm measuring 2.2 cm
in diameter. No lymphadenopathy.

Reproductive: Prostate is unremarkable.

Other: No abdominal wall hernia or abnormality. No abdominopelvic
ascites.

Musculoskeletal: No acute osseous abnormality. No aggressive lytic
or sclerotic osseous lesion. Degenerative disc disease with disc
height loss at L5-S1 with bilateral facet arthropathy and foraminal
narrowing.
IMPRESSION: 1. No acute abdominal or pelvic pathology.
2. Diverticulosis without evidence of diverticulitis.
3. Left common iliac artery aneurysm measuring 2.9 cm in short axis.
Right common iliac artery aneurysm measuring 2.2 cm in diameter.

## 2018-05-19 DIAGNOSIS — Q828 Other specified congenital malformations of skin: Secondary | ICD-10-CM | POA: Diagnosis not present

## 2018-06-08 DIAGNOSIS — H353211 Exudative age-related macular degeneration, right eye, with active choroidal neovascularization: Secondary | ICD-10-CM | POA: Diagnosis not present

## 2018-07-10 DIAGNOSIS — Z0000 Encounter for general adult medical examination without abnormal findings: Secondary | ICD-10-CM | POA: Diagnosis not present

## 2018-07-20 DIAGNOSIS — Z1211 Encounter for screening for malignant neoplasm of colon: Secondary | ICD-10-CM | POA: Diagnosis not present

## 2018-07-20 DIAGNOSIS — Z1212 Encounter for screening for malignant neoplasm of rectum: Secondary | ICD-10-CM | POA: Diagnosis not present

## 2018-07-20 LAB — COLOGUARD

## 2018-07-28 DIAGNOSIS — D3002 Benign neoplasm of left kidney: Secondary | ICD-10-CM | POA: Diagnosis not present

## 2018-08-16 ENCOUNTER — Encounter (INDEPENDENT_AMBULATORY_CARE_PROVIDER_SITE_OTHER): Payer: Self-pay | Admitting: Internal Medicine

## 2018-08-16 ENCOUNTER — Telehealth (INDEPENDENT_AMBULATORY_CARE_PROVIDER_SITE_OTHER): Payer: Self-pay | Admitting: *Deleted

## 2018-08-16 ENCOUNTER — Encounter (INDEPENDENT_AMBULATORY_CARE_PROVIDER_SITE_OTHER): Payer: Self-pay | Admitting: *Deleted

## 2018-08-16 ENCOUNTER — Ambulatory Visit (INDEPENDENT_AMBULATORY_CARE_PROVIDER_SITE_OTHER): Payer: Medicare Other | Admitting: Internal Medicine

## 2018-08-16 VITALS — BP 130/84 | HR 97 | Temp 98.1°F | Ht 68.5 in | Wt 183.1 lb

## 2018-08-16 DIAGNOSIS — R195 Other fecal abnormalities: Secondary | ICD-10-CM | POA: Diagnosis not present

## 2018-08-16 MED ORDER — PEG 3350-KCL-NA BICARB-NACL 420 G PO SOLR: 4000.0000 mL | Freq: Once | ORAL | 0 refills | Status: AC

## 2018-08-16 NOTE — Patient Instructions (Signed)
The risks of bleeding, perforation and infection were reviewed with patient.  

## 2018-08-16 NOTE — Progress Notes (Signed)
Subjective:    Patient ID: Brett Russell, male    DOB: [birth year], 73 y.o.   MRN: [id number]  HPI Referred by Dr. Luan Pulling for positive cologuard. Last colonoscopy in 2014 (average risk). Prep satisfactory. Two small polyps removed from transverse colon and submitted together. One removed via cold biopsy and the second one was cold snared. 80mm ulcerated polyp snared from the distal sigmoid colon. Few small diverticula at sigmoid colon. Normal rectal mucosa and anal rectal junction. Biopsy: Tubular adenoma. Next colonoscopy in 5 yrs.  No BRRB. 2-3 months ago his stool was black. Had been eating blue berry bagels. Appetite is okay. No weight loss.  Has a  BM every 2-3 days.  Has to take a laxative at times due to constipation.  No family hx of colon cancer.   Hx of abdominal aortic endovascular stent graft.  Review of Systems Past Medical History:  Diagnosis Date  . AAA (abdominal aortic aneurysm) ([place])   . Allergy    takes Allegra daily  . Anxiety    takes Xanax nightly  . Aortic aneurysm ([place]) 2007  . Arthritis   . COPD (chronic obstructive pulmonary disease) ([place])   . Enlarged prostate    slightly  . GERD (gastroesophageal reflux disease)    takes Omeprazole daily  . Gout    takes Allopurinol nightly  . History of bronchitis    many yrs ago  . History of colon polyps    benign  . History of kidney stones    hx of  . Hypertension    takes Amlodipine daily  . Insomnia    takes Melatonin nightly  . Macular degeneration    wet-right eye .Injection of AvAStin every 10 wks  . Parkinson disease ([place]) 2006   takes Sinemet daily  . 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 ABDOMINAL AORTIC ANEURYSM REPAIR  2007  . ABDOMINAL AORTIC ENDOVASCULAR STENT GRAFT Left 12/03/2016   Procedure: INSERTION OF LEFT COMMON  ILIAC STENT-LEFT INTERNAL ILIAC  ARTERY COILING;  Surgeon: Rosetta Posner, MD;  Location:  [facility];  Service: Vascular;  Laterality: Left;  . APPENDECTOMY    . CHOLECYSTECTOMY    . COLONOSCOPY N/A 11/29/2012   Procedure: COLONOSCOPY;  Surgeon: Rogene Houston, MD;  [facility];  Service: Endoscopy;  Laterality: N/A;  1200-moved to [place] notified pt  . KIDNEY SURGERY  2007   Tumor removed  . Right knee     Cartilage removed at age 34    Allergies  Allergen Reactions  . Asa [Aspirin] Other (See Comments)    Severe nosebleeds in the past.   . Morphine And Related     Hallucinations/attempt to elope from the hospital    Current Outpatient Medications on File Prior to Visit  Medication Sig Dispense Refill  . allopurinol (ZYLOPRIM) 300 MG tablet Take 300 mg by mouth at bedtime.     . ALPRAZolam (XANAX) 1 MG tablet Take 0.5-1 mg by mouth 2 (two) times daily. 1 in morning and 1 at bedtime.    Marland Kitchen amLODipine (NORVASC) 10 MG tablet Take 10 mg by mouth at bedtime.     . Bevacizumab (AVASTIN IV) Inject 3.75 mg/mL into the vein.    . carbidopa-levodopa (SINEMET IR) 25-100 MG per tablet Take 1 tablet by mouth 2 (two) times daily.    . fexofenadine (ALLEGRA)  180 MG tablet Take 180 mg by mouth daily at 2 PM.    . Melatonin 1 MG TABS Take 1 tablet by mouth at bedtime as needed (sleep).     Marland Kitchen omeprazole (PRILOSEC) 20 MG capsule Take 20 mg by mouth daily.     No current facility-administered medications on file prior to visit.         Objective:   Physical Exam Blood pressure 130/84, pulse 97, temperature 98.1 F (36.7 C), height 5' 8.5" (1.74 m), weight 183 lb 1.6 oz (83.1 kg). Alert and oriented. Skin warm and dry. Oral mucosa is moist.   . Sclera anicteric, conjunctivae is pink. Thyroid not enlarged. No cervical lymphadenopathy. Lungs clear. Heart regular rate and rhythm.  Abdomen is soft. Bowel sounds are positive. No hepatomegaly. No abdominal masses felt. No tenderness.  No edema to lower extremities.         Assessment & Plan:  Positive cologuard. Colonic  neoplasm, polyp needs to be ruled out. The risks of bleeding, perforation and infection were reviewed with patient.

## 2018-08-16 NOTE — Telephone Encounter (Signed)
Patient needs trilyte 

## 2018-08-31 DIAGNOSIS — H35031 Hypertensive retinopathy, right eye: Secondary | ICD-10-CM | POA: Diagnosis not present

## 2018-08-31 DIAGNOSIS — H353121 Nonexudative age-related macular degeneration, left eye, early dry stage: Secondary | ICD-10-CM | POA: Diagnosis not present

## 2018-08-31 DIAGNOSIS — H353211 Exudative age-related macular degeneration, right eye, with active choroidal neovascularization: Secondary | ICD-10-CM | POA: Diagnosis not present

## 2018-08-31 DIAGNOSIS — H43813 Vitreous degeneration, bilateral: Secondary | ICD-10-CM | POA: Diagnosis not present

## 2018-09-20 ENCOUNTER — Ambulatory Visit (HOSPITAL_COMMUNITY)
Admission: RE | Admit: 2018-09-20 | Discharge: 2018-09-20 | Disposition: A | Discharge status: Home / Self Care | Payer: Medicare Other | Attending: Internal Medicine | Admitting: Internal Medicine

## 2018-09-20 ENCOUNTER — Encounter (HOSPITAL_COMMUNITY)
Admission: RE | Disposition: A | Discharge status: Home / Self Care | Payer: Self-pay | Source: Home / Self Care | Attending: Internal Medicine

## 2018-09-20 ENCOUNTER — Other Ambulatory Visit: Payer: Self-pay

## 2018-09-20 ENCOUNTER — Encounter (HOSPITAL_COMMUNITY): Payer: Self-pay

## 2018-09-20 DIAGNOSIS — K644 Residual hemorrhoidal skin tags: Secondary | ICD-10-CM | POA: Insufficient documentation

## 2018-09-20 DIAGNOSIS — K219 Gastro-esophageal reflux disease without esophagitis: Secondary | ICD-10-CM | POA: Diagnosis not present

## 2018-09-20 DIAGNOSIS — R195 Other fecal abnormalities: Secondary | ICD-10-CM | POA: Diagnosis not present

## 2018-09-20 DIAGNOSIS — F419 Anxiety disorder, unspecified: Secondary | ICD-10-CM | POA: Diagnosis not present

## 2018-09-20 DIAGNOSIS — D120 Benign neoplasm of cecum: Secondary | ICD-10-CM | POA: Diagnosis not present

## 2018-09-20 DIAGNOSIS — G4700 Insomnia, unspecified: Secondary | ICD-10-CM | POA: Insufficient documentation

## 2018-09-20 DIAGNOSIS — M109 Gout, unspecified: Secondary | ICD-10-CM | POA: Diagnosis not present

## 2018-09-20 DIAGNOSIS — Z886 Allergy status to analgesic agent status: Secondary | ICD-10-CM | POA: Diagnosis not present

## 2018-09-20 DIAGNOSIS — D125 Benign neoplasm of sigmoid colon: Secondary | ICD-10-CM | POA: Diagnosis not present

## 2018-09-20 DIAGNOSIS — J449 Chronic obstructive pulmonary disease, unspecified: Secondary | ICD-10-CM | POA: Insufficient documentation

## 2018-09-20 DIAGNOSIS — I10 Essential (primary) hypertension: Secondary | ICD-10-CM | POA: Insufficient documentation

## 2018-09-20 DIAGNOSIS — F17210 Nicotine dependence, cigarettes, uncomplicated: Secondary | ICD-10-CM | POA: Insufficient documentation

## 2018-09-20 DIAGNOSIS — Z885 Allergy status to narcotic agent status: Secondary | ICD-10-CM | POA: Insufficient documentation

## 2018-09-20 DIAGNOSIS — K5730 Diverticulosis of large intestine without perforation or abscess without bleeding: Secondary | ICD-10-CM | POA: Insufficient documentation

## 2018-09-20 DIAGNOSIS — D123 Benign neoplasm of transverse colon: Secondary | ICD-10-CM | POA: Diagnosis not present

## 2018-09-20 DIAGNOSIS — D122 Benign neoplasm of ascending colon: Secondary | ICD-10-CM | POA: Insufficient documentation

## 2018-09-20 DIAGNOSIS — K648 Other hemorrhoids: Secondary | ICD-10-CM | POA: Insufficient documentation

## 2018-09-20 DIAGNOSIS — Z79899 Other long term (current) drug therapy: Secondary | ICD-10-CM | POA: Insufficient documentation

## 2018-09-20 HISTORY — PX: COLONOSCOPY: SHX5424

## 2018-09-20 HISTORY — PX: POLYPECTOMY: SHX5525

## 2018-09-20 SURGERY — COLONOSCOPY
Anesthesia: Moderate Sedation

## 2018-09-20 MED ORDER — STERILE WATER FOR IRRIGATION IR SOLN
Status: DC | PRN
  Administered 2018-09-20: 15:00:00

## 2018-09-20 MED ORDER — PROMETHAZINE HCL 25 MG/ML IJ SOLN
INTRAMUSCULAR | Status: DC | PRN
  Administered 2018-09-20: 6.25 mg via INTRAVENOUS

## 2018-09-20 MED ORDER — SODIUM CHLORIDE 0.9 % IV SOLN: INTRAVENOUS | Status: DC

## 2018-09-20 MED ORDER — MIDAZOLAM HCL 5 MG/5ML IJ SOLN
INTRAMUSCULAR | Status: AC
  Filled 2018-09-20: qty 10

## 2018-09-20 MED ORDER — MIDAZOLAM HCL 5 MG/5ML IJ SOLN
INTRAMUSCULAR | Status: DC | PRN
  Administered 2018-09-20 (×2): 2 mg via INTRAVENOUS
  Administered 2018-09-20: 1 mg via INTRAVENOUS
  Administered 2018-09-20: 2 mg via INTRAVENOUS

## 2018-09-20 MED ORDER — MEPERIDINE HCL 100 MG/ML IJ SOLN
INTRAMUSCULAR | Status: AC
  Filled 2018-09-20: qty 2

## 2018-09-20 MED ORDER — MEPERIDINE HCL 100 MG/ML IJ SOLN
INTRAMUSCULAR | Status: AC
  Filled 2018-09-20: qty 1

## 2018-09-20 MED ORDER — LIDOCAINE VISCOUS HCL 2 % MT SOLN
OROMUCOSAL | Status: AC
  Filled 2018-09-20: qty 15

## 2018-09-20 MED ORDER — MEPERIDINE HCL 50 MG/ML IJ SOLN
INTRAMUSCULAR | Status: DC | PRN
  Administered 2018-09-20 (×3): 25 mg via INTRAVENOUS

## 2018-09-20 MED ORDER — SODIUM CHLORIDE 0.9% FLUSH
INTRAVENOUS | Status: AC
  Filled 2018-09-20: qty 10

## 2018-09-20 MED ORDER — PROMETHAZINE HCL 25 MG/ML IJ SOLN
INTRAMUSCULAR | Status: AC
  Filled 2018-09-20: qty 1

## 2018-09-20 NOTE — Discharge Instructions (Signed)
High-Fiber Diet Fiber, also called dietary fiber, is a type of carbohydrate that is found in fruits, vegetables, whole grains, and beans. A high-fiber diet can have many health benefits. Your health care provider may recommend a high-fiber diet to help:  Prevent constipation. Fiber can make your bowel movements more regular.  Lower your cholesterol.  Relieve the following conditions: ? Swelling of veins in the anus (hemorrhoids). ? Swelling and irritation (inflammation) of specific areas of the digestive tract (uncomplicated diverticulosis). ? A problem of the large intestine (colon) that sometimes causes pain and diarrhea (irritable bowel syndrome, IBS).  Prevent overeating as part of a weight-loss plan.  Prevent heart disease, type 2 diabetes, and certain cancers. What is my plan? The recommended daily fiber intake in grams (g) includes:  38 g for men age 60 or younger.  30 g for men over age 55.  45 g for women age 54 or younger.  21 g for women over age 81. You can get the recommended daily intake of dietary fiber by:  Eating a variety of fruits, vegetables, grains, and beans.  Taking a fiber supplement, if it is not possible to get enough fiber through your diet. What do I need to know about a high-fiber diet?  It is better to get fiber through food sources rather than from fiber supplements. There is not a lot of research about how effective supplements are.  Always check the fiber content on the nutrition facts label of any prepackaged food. Look for foods that contain 5 g of fiber or more per serving.  Talk with a diet and nutrition specialist (dietitian) if you have questions about specific foods that are recommended or not recommended for your medical condition, especially if those foods are not listed below.  Gradually increase how much fiber you consume. If you increase your intake of dietary fiber too quickly, you may have bloating, cramping, or gas.  Drink plenty  of water. Water helps you to digest fiber. What are tips for following this plan?  Eat a wide variety of high-fiber foods.  Make sure that half of the grains that you eat each day are whole grains.  Eat breads and cereals that are made with whole-grain flour instead of refined flour or white flour.  Eat brown rice, bulgur wheat, or millet instead of white rice.  Start the day with a breakfast that is high in fiber, such as a cereal that contains 5 g of fiber or more per serving.  Use beans in place of meat in soups, salads, and pasta dishes.  Eat high-fiber snacks, such as berries, raw vegetables, nuts, and popcorn.  Choose whole fruits and vegetables instead of processed forms like juice or sauce. What foods can I eat?  Fruits Berries. Pears. Apples. Oranges. Avocado. Prunes and raisins. Dried figs. Vegetables Sweet potatoes. Spinach. Kale. Artichokes. Cabbage. Broccoli. Cauliflower. Green peas. Carrots. Squash. Grains Whole-grain breads. Multigrain cereal. Oats and oatmeal. Brown rice. Barley. Bulgur wheat. Garden. Quinoa. Bran muffins. Popcorn. Rye wafer crackers. Meats and other proteins Navy, kidney, and pinto beans. Soybeans. Split peas. Lentils. Nuts and seeds. Dairy Fiber-fortified yogurt. Beverages Fiber-fortified soy milk. Fiber-fortified orange juice. Other foods Fiber bars. The items listed above may not be a complete list of recommended foods and beverages. Contact a dietitian for more options. What foods are not recommended? Fruits Fruit juice. Cooked, strained fruit. Vegetables Fried potatoes. Canned vegetables. Well-cooked vegetables. Grains White bread. Pasta made with refined flour. White rice. Meats and other  proteins Fatty cuts of meat. Fried chicken or fried fish. Dairy Milk. Yogurt. Cream cheese. Sour cream. Fats and oils Butters. Beverages Soft drinks. Other foods Cakes and pastries. The items listed above may not be a complete list of foods  and beverages to avoid. Contact a dietitian for more information. Summary  Fiber is a type of carbohydrate. It is found in fruits, vegetables, whole grains, and beans.  There are many health benefits of eating a high-fiber diet, such as preventing constipation, lowering blood cholesterol, helping with weight loss, and reducing your risk of heart disease, diabetes, and certain cancers.  Gradually increase your intake of fiber. Increasing too fast can result in cramping, bloating, and gas. Drink plenty of water while you increase your fiber.  The best sources of fiber include whole fruits and vegetables, whole grains, nuts, seeds, and beans. This information is not intended to replace advice given to you by your health care provider. Make sure you discuss any questions you have with your health care provider. Document Released: 07/05/2005 Document Revised: 05/09/2017 Document Reviewed: 05/09/2017 Elsevier Interactive Patient Education  2019 [employer]. Colonoscopy, Adult, Care After This sheet gives you information about how to care for yourself after your procedure. Your health care provider may also give you more specific instructions. If you have problems or questions, contact your health care provider. What can I expect after the procedure? After the procedure, it is common to have:  A small amount of blood in your stool for 24 hours after the procedure.  Some gas.  Mild abdominal cramping or bloating. Follow these instructions at home: General instructions  For the first 24 hours after the procedure: ? Do not drive or use machinery. ? Do not sign important documents. ? Do not drink alcohol. ? Do your regular daily activities at a slower pace than normal. ? Eat soft, easy-to-digest foods.  Take over-the-counter or prescription medicines only as told by your health care provider. Relieving cramping and bloating   Try walking around when you have cramps or feel  bloated.  Apply heat to your abdomen as told by your health care provider. Use a heat source that your health care provider recommends, such as a moist heat pack or a heating pad. ? Place a towel between your skin and the heat source. ? Leave the heat on for 20-30 minutes. ? Remove the heat if your skin turns bright red. This is especially important if you are unable to feel pain, heat, or cold. You may have a greater risk of getting burned. Eating and drinking   Drink enough fluid to keep your urine pale yellow.  Resume your normal diet as instructed by your health care provider. Avoid heavy or fried foods that are hard to digest.  Avoid drinking alcohol for as long as instructed by your health care provider. Contact a health care provider if:  You have blood in your stool 2-3 days after the procedure. Get help right away if:  You have more than a small spotting of blood in your stool.  You pass large blood clots in your stool.  Your abdomen is swollen.  You have nausea or vomiting.  You have a fever.  You have increasing abdominal pain that is not relieved with medicine. Summary  After the procedure, it is common to have a small amount of blood in your stool. You may also have mild abdominal cramping and bloating.  For the first 24 hours after the procedure, do not drive  or use machinery, sign important documents, or drink alcohol.  Contact your health care provider if you have a lot of blood in your stool, nausea or vomiting, a fever, or increased abdominal pain. This information is not intended to replace advice given to you by your health care provider. Make sure you discuss any questions you have with your health care provider. Document Released: 02/17/2004 Document Revised: 04/27/2017 Document Reviewed: 09/16/2015 Elsevier Interactive Patient Education  2019 [employer] Colon Polyps  Polyps are tissue growths inside the body. Polyps can grow in many places,  including the large intestine (colon). A polyp may be a round bump or a mushroom-shaped growth. You could have one polyp or several. Most colon polyps are noncancerous (benign). However, some colon polyps can become cancerous over time. Finding and removing the polyps early can help prevent this. What are the causes? The exact cause of colon polyps is not known. What increases the risk? You are more likely to develop this condition if you:  Have a family history of colon cancer or colon polyps.  Are older than 78 or older than 45 if you are African American.  Have inflammatory bowel disease, such as ulcerative colitis or Crohn's disease.  Have certain hereditary conditions, such as: ? Familial adenomatous polyposis. ? Lynch syndrome. ? Turcot syndrome. ? Peutz-Jeghers syndrome.  Are overweight.  Smoke cigarettes.  Do not get enough exercise.  Drink too much alcohol.  Eat a diet that is high in fat and red meat and low in fiber.  Had childhood cancer that was treated with abdominal radiation. What are the signs or symptoms? Most polyps do not cause symptoms. If you have symptoms, they may include:  Blood coming from your rectum when having a bowel movement.  Blood in your stool. The stool may look dark red or black.  Abdominal pain.  A change in bowel habits, such as constipation or diarrhea. How is this diagnosed? This condition is diagnosed with a colonoscopy. This is a procedure in which a lighted, flexible scope is inserted into the anus and then passed into the colon to examine the area. Polyps are sometimes found when a colonoscopy is done as part of routine cancer screening tests. How is this treated? Treatment for this condition involves removing any polyps that are found. Most polyps can be removed during a colonoscopy. Those polyps will then be tested for cancer. Additional treatment may be needed depending on the results of testing. Follow these instructions at  home: Lifestyle  Maintain a healthy weight, or lose weight if recommended by your health care provider.  Exercise every day or as told by your health care provider.  Do not use any products that contain nicotine or tobacco, such as cigarettes and e-cigarettes. If you need help quitting, ask your health care provider.  If you drink alcohol, limit how much you have: ? 0-1 drink a day for women. ? 0-2 drinks a day for men.  Be aware of how much alcohol is in your drink. In the [place] one drink equals one 12 oz bottle of beer (355 mL), one 5 oz glass of wine (148 mL), or one 1 oz shot of hard liquor (44 mL). Eating and drinking   Eat foods that are high in fiber, such as fruits, vegetables, and whole grains.  Eat foods that are high in calcium and vitamin D, such as milk, cheese, yogurt, eggs, liver, fish, and broccoli.  Limit foods that are high in fat, such as  fried foods and desserts.  Limit the amount of red meat and processed meat you eat, such as hot dogs, sausage, bacon, and lunch meats. General instructions  Keep all follow-up visits as told by your health care provider. This is important. ? This includes having regularly scheduled colonoscopies. ? Talk to your health care provider about when you need a colonoscopy. Contact a health care provider if:  You have new or worsening bleeding during a bowel movement.  You have new or increased blood in your stool.  You have a change in bowel habits.  You lose weight for no known reason. Summary  Polyps are tissue growths inside the body. Polyps can grow in many places, including the colon.  Most colon polyps are noncancerous (benign), but some can become cancerous over time.  This condition is diagnosed with a colonoscopy.  Treatment for this condition involves removing any polyps that are found. Most polyps can be removed during a colonoscopy. This information is not intended to replace advice given to you by your health  care provider. Make sure you discuss any questions you have with your health care provider. Document Released: 03/31/2004 Document Revised: 10/20/2017 Document Reviewed: 10/20/2017 Elsevier Interactive Patient Education  2019 [place]. No aspirin or NSAIDs for 1 week. Resume usual medications as before. High-fiber diet No driving for 24 hours. Physician will call with biopsy results. Next colonoscopy in 3 years

## 2018-09-20 NOTE — Op Note (Signed)
[facility] Patient Name: Brett Russell Procedure Date: 09/20/2018 2:31 PM MRN: [id number] Date of Birth: 31-Aug-1945 Attending MD: Hildred Laser , MD CSN: [id number] Age: 73 Admit Type: Outpatient Procedure:                Colonoscopy Indications:              Positive Cologuard test Providers:                Hildred Laser, MD, Otis Peak B. Sharon Seller, RN, Randa Spike, Technician Referring MD:             Jasper Loser. Luan Pulling, MD Medicines:                Promethazine 6.25 mg IV, Meperidine 75 mg IV,                            Midazolam 7 mg IV Complications:            No immediate complications. Estimated Blood Loss:     Estimated blood loss was minimal. Procedure:                Pre-Anesthesia Assessment:                           - Prior to the procedure, a History and Physical                            was performed, and patient medications and                            allergies were reviewed. The patient's tolerance of                            previous anesthesia was also reviewed. The risks                            and benefits of the procedure and the sedation                            options and risks were discussed with the patient.                            All questions were answered, and informed consent                            was obtained. Prior Anticoagulants: The patient has                            taken no previous anticoagulant or antiplatelet                            agents. ASA Grade Assessment: III - A patient with  severe systemic disease. After reviewing the risks                            and benefits, the patient was deemed in                            satisfactory condition to undergo the procedure.                           After obtaining informed consent, the colonoscope                            was passed under direct vision. Throughout the                            procedure,  the patient's blood pressure, pulse, and                            oxygen saturations were monitored continuously. The                            [id number]-[id number] ([id number]) scope was introduced through                            the anus and advanced to the the cecum, identified                            by appendiceal orifice and ileocecal valve. The                            colonoscopy was performed without difficulty. The                            patient tolerated the procedure well. The quality                            of the bowel preparation was good. The ileocecal                            valve, appendiceal orifice, and rectum were                            photographed. Scope In: 3:02:31 PM Scope Out: 3:50:51 PM Scope Withdrawal Time: 0 hours 37 minutes 41 seconds  Total Procedure Duration: 0 hours 48 minutes 20 seconds  Findings:      The perianal and digital rectal examinations were normal.      A 8 to 12 mm polyp was found in the cecum. The polyp was multi-lobulated       and sessile. Area was successfully injected with Eleview for lesion       assessment, and this injection appeared to lift the lesion adequately.       The polyp was removed with a hot snare. Resection and retrieval were       complete. The pathology specimen was placed into Bottle  Number 2.      A small polyp was found in the ascending colon. The polyp was sessile.       Biopsies were taken with a cold forceps for histology. The pathology       specimen was placed into Bottle Number 1.      12 polyps were found in the splenic flexure, transverse colon, hepatic       flexure, ascending colon and cecum. The polyps were 4 to 7 mm in size.       These polyps were removed with a cold snare. Resection and retrieval       were complete. The pathology specimen was placed into Bottle Number 1.      A 10 mm polyp was found in the proximal sigmoid colon. The polyp was       semi-pedunculated. The polyp was removed  with a piecemeal technique       using a hot snare. Resection and retrieval were complete. The pathology       specimen was placed into Bottle Number 3.      Scattered medium-mouthed diverticula were found in the sigmoid colon.      External and internal hemorrhoids were found during retroflexion. The       hemorrhoids were small. Impression:               - One 8 to 12 mm polyp in the cecum, removed with a                            hot snare. Resected and retrieved. Injected.                           - One small polyp in the ascending colon. Biopsied.                           - Twelve 4 to 7 mm polyps at the splenic flexure,                            in the transverse colon, at the hepatic flexure, in                            the ascending colon and in the cecum, removed with                            a cold snare. Resected and retrieved.                           - One 10 mm polyp in the proximal sigmoid colon,                            removed piecemeal using a hot snare. Resected and                            retrieved.                           - Diverticulosis in the sigmoid colon.                           -  External and internal hemorrhoids.                           Comment: Patient had 14 polyps removed ranging in                            size from 3 mm to 12 mm. Moderate Sedation:      Moderate (conscious) sedation was administered by the endoscopy nurse       and supervised by the endoscopist. The following parameters were       monitored: oxygen saturation, heart rate, blood pressure, CO2       capnography and response to care. Total physician intraservice time was       49 minutes. Recommendation:           - Patient has a contact number available for                            emergencies. The signs and symptoms of potential                            delayed complications were discussed with the                            patient. Return to normal activities  tomorrow.                            Written discharge instructions were provided to the                            patient.                           - High fiber diet today.                           - Continue present medications.                           - No aspirin, ibuprofen, naproxen, or other                            non-steroidal anti-inflammatory drugs for 7 days                            after polyp removal.                           - Await pathology results.                           - Repeat colonoscopy in 3 years for surveillance. Procedure Code(s):        --- Professional ---                           [id number], Colonoscopy, flexible; with removal of  tumor(s), polyp(s), or other lesion(s) by snare                            technique                           45381, Colonoscopy, flexible; with directed                            submucosal injection(s), any substance                           45380, 59, Colonoscopy, flexible; with biopsy,                            single or multiple                           99153, Moderate sedation; each additional 15                            minutes intraservice time                           99153, Moderate sedation; each additional 15                            minutes intraservice time                           G0500, Moderate sedation services provided by the                            same physician or other qualified health care                            professional performing a gastrointestinal                            endoscopic service that sedation supports,                            requiring the presence of an independent trained                            observer to assist in the monitoring of the                            patient's level of consciousness and physiological                            status; initial 15 minutes of intra-service time;                            patient  age 69 years or older (additional time 77  be reported with [phone], as appropriate) Diagnosis Code(s):        --- Professional ---                           D12.0, Benign neoplasm of cecum                           D12.2, Benign neoplasm of ascending colon                           D12.5, Benign neoplasm of sigmoid colon                           D12.3, Benign neoplasm of transverse colon (hepatic                            flexure or splenic flexure)                           K64.8, Other hemorrhoids                           R19.5, Other fecal abnormalities                           K57.30, Diverticulosis of large intestine without                            perforation or abscess without bleeding CPT copyright 2018 [employer]. All rights reserved. The codes documented in this report are preliminary and upon coder review may  be revised to meet current compliance requirements. Hildred Laser, MD Hildred Laser, MD 09/20/2018 4:07:50 PM This report has been signed electronically. Number of Addenda: 0

## 2018-09-20 NOTE — H&P (Signed)
Brett Russell is an 73 y.o. male.   Chief Complaint: Patient is here for colonoscopy. HPI: 73 year old Caucasian male who has a history of colonic adenomas.  Last exam was about 5 years ago.  It was difficult for him to take a prep.  He decided to have Cologuard instead and it came back positive.  He is therefore here to undergo diagnostic colonoscopy.  He has not experienced change in bowel habits or rectal bleeding.  While family history is positive for various malignancies but not for colon carcinoma.  Past Medical History:  Diagnosis Date  . AAA (abdominal aortic aneurysm) ([place])   . Allergy    takes Allegra daily  . Anxiety    takes Xanax nightly  . Aortic aneurysm ([place]) 2007  . Arthritis   . COPD (chronic obstructive pulmonary disease) ([place])   . Enlarged prostate    slightly  . GERD (gastroesophageal reflux disease)    takes Omeprazole daily  . Gout    takes Allopurinol nightly  . History of bronchitis    many yrs ago  . History of colon polyps    benign  . History of kidney stones    hx of  . Hypertension    takes Amlodipine daily  . Insomnia    takes Melatonin nightly  . Macular degeneration    wet-right eye .Injection of AvAStin every 10 wks  . Parkinson disease ([place]) 2006   takes Sinemet daily  . 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 ABDOMINAL AORTIC ANEURYSM REPAIR  2007  . ABDOMINAL AORTIC ENDOVASCULAR STENT GRAFT Left 12/03/2016   Procedure: INSERTION OF LEFT COMMON  ILIAC STENT-LEFT INTERNAL ILIAC  ARTERY COILING;  Surgeon: Rosetta Posner, MD;  Location: [facility];  Service: Vascular;  Laterality: Left;  . APPENDECTOMY    . CHOLECYSTECTOMY    . COLONOSCOPY N/A 11/29/2012   Procedure: COLONOSCOPY;  Surgeon: Rogene Houston, MD;  [facility];  Service: Endoscopy;  Laterality: N/A;  1200-moved to [place] notified pt  . KIDNEY SURGERY  2007   Tumor removed  . Right knee     Cartilage removed at age 71    Family History  Problem Relation Age of Onset  . Kidney disease Father   . Heart disease Father   . AAA (abdominal aortic aneurysm) Father   . Healthy Brother   . Healthy Daughter   . Constipation Daughter   . Healthy Daughter    Social History:  reports that he has been smoking cigarettes. He started smoking about 49 years ago. He has a 49.00 pack-year smoking history. He has never used smokeless tobacco. He reports that he does not drink alcohol or use drugs.  Allergies:  Allergies  Allergen Reactions  . Asa [Aspirin] Other (See Comments)    Severe nosebleeds in the past.   . Morphine And Related     Hallucinations/attempt to elope from the hospital    Medications Prior to Admission  Medication Sig Dispense Refill  . acetaminophen (TYLENOL) 500 MG tablet Take 500-1,000 mg by mouth every 6 (six) hours as needed (for pain).    Marland Kitchen allopurinol (ZYLOPRIM) 300 MG tablet Take 300 mg by mouth at bedtime.     . ALPRAZolam (XANAX) 1 MG tablet Take 1 mg by mouth 2 (two) times daily.     Marland Kitchen amLODipine (NORVASC) 10 MG tablet  Take 10 mg by mouth at bedtime.     . Bevacizumab (AVASTIN IV) Inject 3.75 mg/mL into the vein every 3 (three) months. Every 12 weeks    . carbidopa-levodopa (SINEMET IR) 25-100 MG per tablet Take 1 tablet by mouth 2 (two) times daily.    . fexofenadine (ALLEGRA) 180 MG tablet Take 180 mg by mouth daily as needed (for allergies.).     Marland Kitchen Melatonin 1 MG TABS Take 2 mg by mouth at bedtime as needed (sleep).     Marland Kitchen omeprazole (PRILOSEC) 20 MG capsule Take 20 mg by mouth daily before breakfast.     . ondansetron (ZOFRAN-ODT) 4 MG disintegrating tablet Take 4 mg by mouth 4 (four) times daily as needed for nausea or vomiting.      No results found for this or any previous visit (from the past 48 hour(s)). No results found.  ROS  Blood pressure 130/78, pulse 92, resp. rate (!) 21, height 5\' 8"  (1.727 m), weight 81.6 kg, SpO2 95 %. Physical  Exam  Constitutional: He appears well-developed and well-nourished.  HENT:  Mouth/Throat: Oropharynx is clear and moist.  Eyes: Conjunctivae are normal. No scleral icterus.  Neck: No thyromegaly present.  Cardiovascular: Normal rate, regular rhythm and normal heart sounds.  No murmur heard. Respiratory: Effort normal and breath sounds normal.  GI:  Abdomen is symmetrical with long midline scar and small umbilical hernia.  No organomegaly or masses.  Musculoskeletal:        General: No edema.  Lymphadenopathy:    He has no cervical adenopathy.  Neurological: He is alert.  Skin: Skin is warm and dry.     Assessment/Plan Positive Cologuard test. Diagnostic colonoscopy.  Hildred Laser, MD 09/20/2018, 2:44 PM

## 2018-09-25 ENCOUNTER — Encounter (HOSPITAL_COMMUNITY): Payer: Self-pay | Admitting: Internal Medicine

## 2018-11-09 DIAGNOSIS — M545 Low back pain: Secondary | ICD-10-CM | POA: Diagnosis not present

## 2018-11-09 DIAGNOSIS — J449 Chronic obstructive pulmonary disease, unspecified: Secondary | ICD-10-CM | POA: Diagnosis not present

## 2018-11-09 DIAGNOSIS — I10 Essential (primary) hypertension: Secondary | ICD-10-CM | POA: Diagnosis not present

## 2018-11-09 DIAGNOSIS — G20 Parkinson's disease: Secondary | ICD-10-CM | POA: Diagnosis not present

## 2018-11-24 DIAGNOSIS — H353211 Exudative age-related macular degeneration, right eye, with active choroidal neovascularization: Secondary | ICD-10-CM | POA: Diagnosis not present

## 2019-01-15 ENCOUNTER — Other Ambulatory Visit: Payer: Self-pay

## 2019-01-15 DIAGNOSIS — I723 Aneurysm of iliac artery: Secondary | ICD-10-CM

## 2019-01-16 DIAGNOSIS — J449 Chronic obstructive pulmonary disease, unspecified: Secondary | ICD-10-CM | POA: Diagnosis not present

## 2019-01-16 DIAGNOSIS — I10 Essential (primary) hypertension: Secondary | ICD-10-CM | POA: Diagnosis not present

## 2019-01-16 DIAGNOSIS — G20 Parkinson's disease: Secondary | ICD-10-CM | POA: Diagnosis not present

## 2019-01-16 DIAGNOSIS — J301 Allergic rhinitis due to pollen: Secondary | ICD-10-CM | POA: Diagnosis not present

## 2019-01-17 ENCOUNTER — Encounter: Payer: Self-pay | Admitting: Family

## 2019-01-17 ENCOUNTER — Ambulatory Visit (INDEPENDENT_AMBULATORY_CARE_PROVIDER_SITE_OTHER): Payer: Medicare Other | Admitting: Family

## 2019-01-17 ENCOUNTER — Ambulatory Visit (HOSPITAL_COMMUNITY)
Admission: RE | Admit: 2019-01-17 | Discharge: 2019-01-17 | Disposition: A | Discharge status: Home / Self Care | Payer: Medicare Other | Source: Ambulatory Visit | Attending: Family | Admitting: Family

## 2019-01-17 ENCOUNTER — Other Ambulatory Visit: Payer: Self-pay

## 2019-01-17 VITALS — BP 130/79 | HR 90 | Temp 97.9°F | Resp 18 | Ht 68.5 in | Wt 177.0 lb

## 2019-01-17 DIAGNOSIS — F17200 Nicotine dependence, unspecified, uncomplicated: Secondary | ICD-10-CM | POA: Diagnosis not present

## 2019-01-17 DIAGNOSIS — I723 Aneurysm of iliac artery: Secondary | ICD-10-CM | POA: Diagnosis not present

## 2019-01-17 DIAGNOSIS — Z95828 Presence of other vascular implants and grafts: Secondary | ICD-10-CM

## 2019-01-17 NOTE — Progress Notes (Signed)
[facility]  CC: Follow up s/p Endovascular Repair of Abdominal Aortic Aneurysm    History of Present Illness  Brett Russell is a 73 y.o. (Feb 01, 1946) male who is s/p coiling and embolization of left internal iliac artery, iliac extension and from bifurcation to external iliac with a 16 x 14 x 14 cm Gore stent graft on 12-03-16 by Dr. Donnetta Hutching for left common iliac artery aneurysm.  He had a 6.? cm open AAA repair by Dr. Kellie Simmering in 2007. At that time imaging found cancer in his left kidney, and tumor was removed in 2007 after the AAA repair.   Dr. Donnetta Hutching last evaluated pt on 01-10-18. At that time duplex showed common iliac artery patency with left aneurysm sac itself measuring 3.3 cm Stable overall.  He will continue his usual activities.  Pt was to return in 1 year with repeat noninvasive studies  He returns today for duplex follow up.   He walks at least a mile daily by working in his yard. He had polio as a child, states his legs have been slightly weaker since this. He also has Parkinson's Disease, diagnosed in 2006 and well controlled.  He had no claudication symptoms prior to the above procedure.  He reports lumbar spine problems for years with associated low back and bilateral hip pain.  He denies abdominal pain.  He does not seem to have claudication symptoms with walking, his walking seems limited by mild residual weakness in his legs from polio and dyspnea; but he rests often and continues his activities.   He wears knee high compression hose for varicose veins.   Diabetic: No Tobaccos use: smoker  (1 ppd, started in 1971)  Past Medical History:  Diagnosis Date  . AAA (abdominal aortic aneurysm) ([place])   . Allergy    takes Allegra daily  . Anxiety    takes Xanax nightly  . Aortic aneurysm ([place]) 2007  . Arthritis   . COPD (chronic obstructive pulmonary disease) ([place])   . Enlarged prostate    slightly  . GERD (gastroesophageal reflux disease)     takes Omeprazole daily  . Gout    takes Allopurinol nightly  . History of bronchitis    many yrs ago  . History of colon polyps    benign  . History of kidney stones    hx of  . Hypertension    takes Amlodipine daily  . Insomnia    takes Melatonin nightly  . Macular degeneration    wet-right eye .Injection of AvAStin every 10 wks  . Parkinson disease ([place]) 2006   takes Sinemet daily  . 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 ABDOMINAL AORTIC ANEURYSM REPAIR  2007  . ABDOMINAL AORTIC ENDOVASCULAR STENT GRAFT Left 12/03/2016   Procedure: INSERTION OF LEFT COMMON  ILIAC STENT-LEFT INTERNAL ILIAC  ARTERY COILING;  Surgeon: Rosetta Posner, MD;  Location: [facility];  Service: Vascular;  Laterality: Left;  . APPENDECTOMY    . CHOLECYSTECTOMY    . COLONOSCOPY N/A 11/29/2012   Procedure: COLONOSCOPY;  Surgeon: Rogene Houston, MD;  [facility];  Service: Endoscopy;  Laterality: N/A;  1200-moved to [place] notified pt  . COLONOSCOPY N/A 09/20/2018   Procedure: COLONOSCOPY;  Surgeon: Rogene Houston, MD;  [facility];  Service: Endoscopy;  Laterality: N/A;  2:25  . KIDNEY  SURGERY  2007   Tumor removed  . POLYPECTOMY  09/20/2018   Procedure: POLYPECTOMY;  Surgeon: Rogene Houston, MD;  [facility];  Service: Endoscopy;;  colon  . Right knee     Cartilage removed at age 65   Social History Social History   Tobacco Use  . Smoking status: Current Every Day Smoker    Packs/day: 1.00    Years: 49.00    Pack years: 49.00    Types: Cigarettes    Start date: 08/11/1969  . Smokeless tobacco: Never Used  Substance Use Topics  . Alcohol use: No    Alcohol/week: 0.0 standard drinks  . Drug use: No   Family History Family History  Problem Relation Age of Onset  . Kidney disease Father   . Heart disease Father   . AAA (abdominal aortic aneurysm) Father   . Healthy Brother   . Healthy  Daughter   . Constipation Daughter   . Healthy Daughter    Current Outpatient Medications on File Prior to Visit  Medication Sig Dispense Refill  . acetaminophen (TYLENOL) 500 MG tablet Take 500-1,000 mg by mouth every 6 (six) hours as needed (for pain).    Marland Kitchen allopurinol (ZYLOPRIM) 300 MG tablet Take 300 mg by mouth at bedtime.     . ALPRAZolam (XANAX) 1 MG tablet Take 1 mg by mouth 2 (two) times daily.     Marland Kitchen amLODipine (NORVASC) 10 MG tablet Take 10 mg by mouth at bedtime.     . Bevacizumab (AVASTIN IV) Inject 3.75 mg/mL into the vein every 3 (three) months. Every 12 weeks    . carbidopa-levodopa (SINEMET IR) 25-100 MG per tablet Take 1 tablet by mouth 2 (two) times daily.    . fexofenadine (ALLEGRA) 180 MG tablet Take 180 mg by mouth daily as needed (for allergies.).     Marland Kitchen Melatonin 1 MG TABS Take 2 mg by mouth at bedtime as needed (sleep).     Marland Kitchen omeprazole (PRILOSEC) 20 MG capsule Take 20 mg by mouth daily before breakfast.     . ondansetron (ZOFRAN-ODT) 4 MG disintegrating tablet Take 4 mg by mouth 4 (four) times daily as needed for nausea or vomiting.     No current facility-administered medications on file prior to visit.    Allergies  Allergen Reactions  . Asa [Aspirin] Other (See Comments)    Severe nosebleeds in the past.   . Morphine And Related     Hallucinations/attempt to elope from the hospital     ROS: See HPI for pertinent positives and negatives.  Physical Examination  Vitals:   01/17/19 0840  BP: 130/79  Pulse: 90  Resp: 18  Temp: 97.9 F (36.6 C)  TempSrc: Temporal  SpO2: 95%  Weight: 177 lb (80.3 kg)  Height: 5' 8.5" (1.74 m)   Body mass index is 26.52 kg/m.  General: A&O x 3, WD, male HEENT: No gross abnormalities  Pulmonary: Sym exp, respirations are non labored, good air movement in all fields CTAB, no rales, rhonchi, or wheezing. Cardiac: Regular rhythm and rate, no murmur appreciated  Vascular: Vessel Right Left  Radial 2+Palpable  2+Palpable  Carotid  without bruit  without bruit  Aorta Not palpable N/A  Femoral 2+Palpable 2+Palpable  Popliteal 2+ palpable Not palpable  PT 2+Palpable Faintly Palpable  DP 1+Palpable Not Palpable   Gastrointestinal: soft, NTND, -G/R, - HSM, - palpable masses, - CVAT B. Musculoskeletal: M/S 5/5 throughout, extremities without ischemic changes Skin: No rashes,  no ulcers, no cellulitis.   Neurologic: Pain and light touch intact in extremities, Motor exam as listed above. Psychiatric: Normal thought content, mood appropriate for clinical situation.     DATA  EVAR Duplex   Current (Date: 01-17-19) Abdominal Aorta Findings: +-------------+-------+----------+----------+---------+--------+----------+ Location     AP (cm)Trans (cm)PSV (cm/s)Waveform ThrombusComments   +-------------+-------+----------+----------+---------+--------+----------+ Proximal     2.46             103       biphasic                    +-------------+-------+----------+----------+---------+--------+----------+ Mid          2.32             35                                    +-------------+-------+----------+----------+---------+--------+----------+ Distal       1.92             73        biphasic                    +-------------+-------+----------+----------+---------+--------+----------+ RT CIA Prox  2.2              97        biphasic                    +-------------+-------+----------+----------+---------+--------+----------+ RT CIA Mid   2.4              47                                    +-------------+-------+----------+----------+---------+--------+----------+ RT CIA Distal                 31                                    +-------------+-------+----------+----------+---------+--------+----------+ RT EIA Prox                   61        biphasic                     +-------------+-------+----------+----------+---------+--------+----------+ RT EIA Mid                    77        triphasic                   +-------------+-------+----------+----------+---------+--------+----------+ RT EIA Distal                 61        triphasic                   +-------------+-------+----------+----------+---------+--------+----------+ LT CIA Prox                   185       triphasic                   +-------------+-------+----------+----------+---------+--------+----------+ LT CIA Mid                    104                                   +-------------+-------+----------+----------+---------+--------+----------+  LT CIA Distal                 94                         suboptimal +-------------+-------+----------+----------+---------+--------+----------+ LT EIA Prox                   55        biphasic                    +-------------+-------+----------+----------+---------+--------+----------+ LT EIA Mid                    87        biphasic                    +-------------+-------+----------+----------+---------+--------+----------+ LT EIA Distal                 72        biphasic                    +-------------+-------+----------+----------+---------+--------+----------+  Suboptimal visualization of the abdominal vasculature due to excessive overlying bowel gas.  Summary: Abdominal Aorta: Technically difficult and limited examination due to excessive bowel gas and body habitus. The abdominal vasculature appears patent. Unable to evaluate previous abdominal aneurysm repair. Unable to identify residual sac of the left common iliac artery aneurysm.    Medical Decision Making  LAKEEM ROZO is a 73 y.o. male who is s/p coiling and embolization of left internal iliac artery, iliac extension and from bifurcation to external iliac with a 16 x 14 x 14 cm Gore stent graft on 12-03-16 by Dr. Donnetta Hutching  for left common iliac artery aneurysm.  He had a 6.? cm open AAA repair by Dr. Kellie Simmering in 2007. At that time imaging found cancer in his left kidney, and tumor was removed in 2007 after the AAA repair.  Suboptimal visualization of the abdominal vasculature due to excessive overlying bowel gas.  Summary: Abdominal Aorta: Technically difficult and limited examination due to excessive bowel gas and body habitus. The abdominal vasculature appears patent. Unable to evaluate previous abdominal aneurysm repair. Unable to identify residual sac of the left common iliac artery aneurysm.   I discussed with the patient the importance of surveillance of the endograft.  The next endograft duplex will be scheduled for 12 months.  The patient will follow up with [place] in 12 months with these studies.   Over 3 minutes was spent counseling patient re smoking cessation, and patient was given several free resources re smoking cessation.   I emphasized the importance of maximal medical management including strict control of blood pressure, blood glucose, and lipid levels, antiplatelet agents, obtaining regular exercise, and cessation of smoking.   Thank you for allowing [place] to participate in this patient's care.  Clemon Chambers, RN, MSN, FNP-C [facility] Office: [phone]  Clinic Physician: Scot Dock  01/17/2019, 8:51 AM

## 2019-01-17 NOTE — Patient Instructions (Addendum)
Before your next abdominal ultrasound:  Avoid gas forming foods and beverages the day before the test.   Take two Extra-Strength Gas-X capsules at bedtime the night before the test. Take another two Extra-Strength Gas-X capsules in the middle of the night if you get up to the restroom, if not, first thing in the morning with water.  Do not chew gum.     Steps to Quit Smoking Smoking tobacco is the leading cause of preventable death. It can affect almost every organ in the body. Smoking puts you and people around you at risk for many serious, long-lasting (chronic) diseases. Quitting smoking can be hard, but it is one of the best things that you can do for your health. It is never too late to quit. How do I get ready to quit? When you decide to quit smoking, make a plan to help you succeed. Before you quit:  Pick a date to quit. Set a date within the next 2 weeks to give you time to prepare.  Write down the reasons why you are quitting. Keep this list in places where you will see it often.  Tell your family, friends, and co-workers that you are quitting. Their support is important.  Talk with your doctor about the choices that may help you quit.  Find out if your health insurance will pay for these treatments.  Know the people, places, things, and activities that make you want to smoke (triggers). Avoid them. What first steps can I take to quit smoking?  Throw away all cigarettes at home, at work, and in your car.  Throw away the things that you use when you smoke, such as ashtrays and lighters.  Clean your car. Make sure to empty the ashtray.  Clean your home, including curtains and carpets. What can I do to help me quit smoking? Talk with your doctor about taking medicines and seeing a counselor at the same time. You are more likely to succeed when you do both.  If you are pregnant or breastfeeding, talk with your doctor about counseling or other ways to quit smoking. Do not  take medicine to help you quit smoking unless your doctor tells you to do so. To quit smoking: Quit right away  Quit smoking totally, instead of slowly cutting back on how much you smoke over a period of time.  Go to counseling. You are more likely to quit if you go to counseling sessions regularly. Take medicine You may take medicines to help you quit. Some medicines need a prescription, and some you can buy over-the-counter. Some medicines may contain a drug called nicotine to replace the nicotine in cigarettes. Medicines may:  Help you to stop having the desire to smoke (cravings).  Help to stop the problems that come when you stop smoking (withdrawal symptoms). Your doctor may ask you to use:  Nicotine patches, gum, or lozenges.  Nicotine inhalers or sprays.  Non-nicotine medicine that is taken by mouth. Find resources Find resources and other ways to help you quit smoking and remain smoke-free after you quit. These resources are most helpful when you use them often. They include:  Online chats with a Social worker.  Phone quitlines.  Printed Furniture conservator/restorer.  Support groups or group counseling.  Text messaging programs.  Mobile phone apps. Use apps on your mobile phone or tablet that can help you stick to your quit plan. There are many free apps for mobile phones and tablets as well as websites. Examples include Quit  Guide from the CDC and smokefree.gov  What things can I do to make it easier to quit?   Talk to your family and friends. Ask them to support and encourage you.  Call a phone quitline (1-800-QUIT-NOW), reach out to support groups, or work with a Social worker.  Ask people who smoke to not smoke around you.  Avoid places that make you want to smoke, such as: ? Bars. ? Parties. ? Smoke-break areas at work.  Spend time with people who do not smoke.  Lower the stress in your life. Stress can make you want to smoke. Try these things to help your  stress: ? Getting regular exercise. ? Doing deep-breathing exercises. ? Doing yoga. ? Meditating. ? Doing a body scan. To do this, close your eyes, focus on one area of your body at a time from head to toe. Notice which parts of your body are tense. Try to relax the muscles in those areas. How will I feel when I quit smoking? Day 1 to 3 weeks Within the first 24 hours, you may start to have some problems that come from quitting tobacco. These problems are very bad 2-3 days after you quit, but they do not often last for more than 2-3 weeks. You may get these symptoms:  Mood swings.  Feeling restless, nervous, angry, or annoyed.  Trouble concentrating.  Dizziness.  Strong desire for high-sugar foods and nicotine.  Weight gain.  Trouble pooping (constipation).  Feeling like you may vomit (nausea).  Coughing or a sore throat.  Changes in how the medicines that you take for other issues work in your body.  Depression.  Trouble sleeping (insomnia). Week 3 and afterward After the first 2-3 weeks of quitting, you may start to notice more positive results, such as:  Better sense of smell and taste.  Less coughing and sore throat.  Slower heart rate.  Lower blood pressure.  Clearer skin.  Better breathing.  Fewer sick days. Quitting smoking can be hard. Do not give up if you fail the first time. Some people need to try a few times before they succeed. Do your best to stick to your quit plan, and talk with your doctor if you have any questions or concerns. Summary  Smoking tobacco is the leading cause of preventable death. Quitting smoking can be hard, but it is one of the best things that you can do for your health.  When you decide to quit smoking, make a plan to help you succeed.  Quit smoking right away, not slowly over a period of time.  When you start quitting, seek help from your doctor, family, or friends. This information is not intended to replace advice  given to you by your health care provider. Make sure you discuss any questions you have with your health care provider. Document Released: 05/01/2009 Document Revised: 09/22/2018 Document Reviewed: 09/23/2018 Elsevier Patient Education  2020 [employer].

## 2019-02-09 ENCOUNTER — Emergency Department (HOSPITAL_COMMUNITY): Payer: Medicare Other

## 2019-02-09 ENCOUNTER — Encounter (HOSPITAL_COMMUNITY): Payer: Self-pay | Admitting: Emergency Medicine

## 2019-02-09 ENCOUNTER — Other Ambulatory Visit: Payer: Self-pay

## 2019-02-09 ENCOUNTER — Emergency Department (HOSPITAL_COMMUNITY)
Admission: EM | Admit: 2019-02-09 | Discharge: 2019-02-09 | Disposition: A | Discharge status: Home / Self Care | Payer: Medicare Other | Attending: Emergency Medicine | Admitting: Emergency Medicine

## 2019-02-09 DIAGNOSIS — J449 Chronic obstructive pulmonary disease, unspecified: Secondary | ICD-10-CM | POA: Insufficient documentation

## 2019-02-09 DIAGNOSIS — Z9049 Acquired absence of other specified parts of digestive tract: Secondary | ICD-10-CM | POA: Insufficient documentation

## 2019-02-09 DIAGNOSIS — Z79899 Other long term (current) drug therapy: Secondary | ICD-10-CM | POA: Insufficient documentation

## 2019-02-09 DIAGNOSIS — F17210 Nicotine dependence, cigarettes, uncomplicated: Secondary | ICD-10-CM | POA: Diagnosis not present

## 2019-02-09 DIAGNOSIS — M5441 Lumbago with sciatica, right side: Secondary | ICD-10-CM | POA: Diagnosis not present

## 2019-02-09 DIAGNOSIS — M5442 Lumbago with sciatica, left side: Secondary | ICD-10-CM | POA: Insufficient documentation

## 2019-02-09 DIAGNOSIS — G20 Parkinson's disease: Secondary | ICD-10-CM | POA: Diagnosis not present

## 2019-02-09 DIAGNOSIS — M545 Low back pain: Secondary | ICD-10-CM | POA: Diagnosis not present

## 2019-02-09 DIAGNOSIS — I10 Essential (primary) hypertension: Secondary | ICD-10-CM | POA: Diagnosis not present

## 2019-02-09 LAB — URINALYSIS, ROUTINE W REFLEX MICROSCOPIC
Bilirubin Urine: NEGATIVE
Glucose, UA: NEGATIVE mg/dL
Hgb urine dipstick: NEGATIVE
Ketones, ur: NEGATIVE mg/dL
Leukocytes,Ua: NEGATIVE
Nitrite: NEGATIVE
Protein, ur: NEGATIVE mg/dL
Specific Gravity, Urine: 1.003 — ABNORMAL LOW (ref 1.005–1.030)
pH: 6 (ref 5.0–8.0)

## 2019-02-09 MED ORDER — HYDROCODONE-ACETAMINOPHEN 5-325 MG PO TABS: ORAL_TABLET | ORAL | 0 refills | Status: DC

## 2019-02-09 NOTE — Discharge Instructions (Addendum)
Alternate ice and heat to your lower back.  Avoid heavy lifting, bending or twisting at least for 1 week.  Follow-up with your primary doctor for recheck.  Return to the ER for any worsening symptoms such as fever, vomiting or abdominal pain

## 2019-02-09 NOTE — ED Provider Notes (Signed)
[facility] EMERGENCY DEPARTMENT Provider Note   CSN: [id number] Arrival date & time: 02/09/19  1014     History   Chief Complaint Chief Complaint  Patient presents with  . Back Pain    HPI ADAMA FERBER is a 73 y.o. male.     HPI   JESSI JESSOP is a 73 y.o. male who presents to the Emergency Department complaining of lower back pain since yesterday.  He describes a constant aching pain of his lower back that is typical for him, but states the pain is now producing a "pins and needles" sensation to the back of his thighs.  He states this sensation is brief only lasting a few seconds and is worse with certain movements.  He denies known injury, but states that he removed and cleaned 20 windows 2 days ago.  He has been taking Tylenol for his pain with some improvement.  He denies numbness or weakness of his lower extremities, abdominal pain, urine or bowel changes, fever chills, nausea or vomiting.  Pain also improves at rest.  He states that he contacted his PCPs office and was advised to come to the emergency room for further evaluation.   Past Medical History:  Diagnosis Date  . AAA (abdominal aortic aneurysm) ([place])   . Allergy    takes Allegra daily  . Anxiety    takes Xanax nightly  . Aortic aneurysm ([place]) 2007  . Arthritis   . COPD (chronic obstructive pulmonary disease) ([place])   . Enlarged prostate    slightly  . GERD (gastroesophageal reflux disease)    takes Omeprazole daily  . Gout    takes Allopurinol nightly  . History of bronchitis    many yrs ago  . History of colon polyps    benign  . History of kidney stones    hx of  . Hypertension    takes Amlodipine daily  . Insomnia    takes Melatonin nightly  . Macular degeneration    wet-right eye .Injection of AvAStin every 10 wks  . Parkinson disease ([place]) 2006   takes Sinemet daily  . 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  Positive colorectal cancer screening using Cologuard test 08/16/2018  . 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 GERD (gastroesophageal reflux disease) 10/03/2012  . Gout 10/02/2012  . Parkinson disease ([place]) 10/02/2012  . HTN (hypertension) 10/02/2012  . Constipation 10/02/2012  . Hemorrhoids 10/02/2012  . Anxiety 10/02/2012    Past Surgical History:  Procedure Laterality Date  . ABDOMINAL AORTIC ANEURYSM REPAIR  2007  . ABDOMINAL AORTIC ENDOVASCULAR STENT GRAFT Left 12/03/2016   Procedure: INSERTION OF LEFT COMMON  ILIAC STENT-LEFT INTERNAL ILIAC  ARTERY COILING;  Surgeon: Rosetta Posner, MD;  Location: [place];  Service: Vascular;  Laterality: Left;  . APPENDECTOMY    . CHOLECYSTECTOMY    . COLONOSCOPY N/A 11/29/2012   Procedure: COLONOSCOPY;  Surgeon: Rogene Houston, MD;  [facility];  Service: Endoscopy;  Laterality: N/A;  1200-moved to [place] notified pt  . COLONOSCOPY N/A 09/20/2018   Procedure: COLONOSCOPY;  Surgeon: Rogene Houston, MD;  [facility];  Service: Endoscopy;  Laterality: N/A;  2:25  . KIDNEY SURGERY  2007   Tumor removed  . POLYPECTOMY  09/20/2018  Procedure: POLYPECTOMY;  Surgeon: Rogene Houston, MD;  [facility];  Service: Endoscopy;;  colon  . Right knee     Cartilage removed at age 59        Home Medications    Prior to Admission medications   Medication Sig Start Date End Date Taking? Authorizing Provider  acetaminophen (TYLENOL) 500 MG tablet Take 500-1,000 mg by mouth every 6 (six) hours as needed (for pain).    [provider]  allopurinol (ZYLOPRIM) 300 MG tablet Take 300 mg by mouth at bedtime.     [provider]  ALPRAZolam Duanne Moron) 1 MG tablet Take 1 mg by mouth 2 (two) times daily.     [provider]  amLODipine (NORVASC) 10 MG tablet Take 10 mg by mouth at bedtime.     [provider]  Bevacizumab (AVASTIN  IV) Inject 3.75 mg/mL into the vein every 3 (three) months. Every 12 weeks    [provider]  carbidopa-levodopa (SINEMET IR) 25-100 MG per tablet Take 1 tablet by mouth 2 (two) times daily.    [provider]  fexofenadine (ALLEGRA) 180 MG tablet Take 180 mg by mouth daily as needed (for allergies.).     [provider]  Melatonin 1 MG TABS Take 2 mg by mouth at bedtime as needed (sleep).     [provider]  omeprazole (PRILOSEC) 20 MG capsule Take 20 mg by mouth daily before breakfast.     [provider]  ondansetron (ZOFRAN-ODT) 4 MG disintegrating tablet Take 4 mg by mouth 4 (four) times daily as needed for nausea or vomiting. 07/10/18   [provider]    Family History Family History  Problem Relation Age of Onset  . Kidney disease Father   . Heart disease Father   . AAA (abdominal aortic aneurysm) Father   . Healthy Brother   . Healthy Daughter   . Constipation Daughter   . Healthy Daughter     Social History Social History   Tobacco Use  . Smoking status: Current Every Day Smoker    Packs/day: 1.00    Years: 49.00    Pack years: 49.00    Types: Cigarettes    Start date: 08/11/1969  . Smokeless tobacco: Never Used  Substance Use Topics  . Alcohol use: No    Alcohol/week: 0.0 standard drinks  . Drug use: No     Allergies   Asa [aspirin] and Morphine and related   Review of Systems Review of Systems  Constitutional: Negative for fever.  Respiratory: Negative for shortness of breath.   Cardiovascular: Negative for chest pain.  Gastrointestinal: Negative for abdominal pain, constipation, diarrhea and vomiting.  Genitourinary: Negative for decreased urine volume, difficulty urinating, dysuria, flank pain and hematuria.  Musculoskeletal: Positive for back pain. Negative for joint swelling and neck pain.  Skin: Negative for rash.  Neurological: Negative for dizziness, weakness and numbness.     Physical  Exam Updated Vital Signs BP 124/74   Pulse 95   Temp 98.1 F (36.7 C) (Oral)   Resp 18   Ht 5\' 8"  (1.727 m)   Wt 79.4 kg   SpO2 94%   BMI 26.61 kg/m   Physical Exam Vitals signs and nursing note reviewed.  Constitutional:      General: He is not in acute distress.    Appearance: Normal appearance. He is well-developed.  HENT:     Head: Atraumatic.  Neck:     Musculoskeletal: Normal  range of motion and neck supple.  Cardiovascular:     Rate and Rhythm: Normal rate and regular rhythm.     Pulses: Normal pulses.     Comments: DP pulses are strong and palpable bilaterally Pulmonary:     Effort: Pulmonary effort is normal. No respiratory distress.     Breath sounds: Normal breath sounds.  Chest:     Chest wall: No tenderness.  Abdominal:     General: There is no distension.     Palpations: Abdomen is soft.     Tenderness: There is no abdominal tenderness.  Musculoskeletal:        General: Tenderness present.     Lumbar back: He exhibits tenderness and pain. He exhibits normal range of motion, no swelling, no deformity, no laceration and normal pulse.     Comments: ttp of the bilateral SI joints.  No spinal tenderness.  Neg SLR bilaterally.  Pt has 5/5 strength against resistance of bilateral lower extremities.     Skin:    General: Skin is warm and dry.     Capillary Refill: Capillary refill takes less than 2 seconds.     Findings: No rash.  Neurological:     General: No focal deficit present.     Mental Status: He is alert.     Sensory: Sensation is intact.     Motor: Motor function is intact. No abnormal muscle tone.     Coordination: Coordination normal.     Gait: Gait is intact. Gait normal.     Deep Tendon Reflexes:     Reflex Scores:      Patellar reflexes are 2+ on the right side and 2+ on the left side.      Achilles reflexes are 2+ on the right side and 2+ on the left side.    Comments: CN II-XII grossly intact      ED Treatments / Results  Labs (all  labs ordered are listed, but only abnormal results are displayed) Labs Reviewed  URINALYSIS, ROUTINE W REFLEX MICROSCOPIC - Abnormal; Notable for the following components:      Result Value   Color, Urine STRAW (*)    Specific Gravity, Urine 1.003 (*)    All other components within normal limits    EKG None  Radiology Dg Lumbar Spine Complete  Result Date: 02/09/2019 CLINICAL DATA:  Worsening low back pain over the past 2 weeks radiating to both lower extremities. EXAM: LUMBAR SPINE - COMPLETE 4+ VIEW COMPARISON:  03/22/2017 FINDINGS: Vertebral body alignment and heights are normal. There is mild spondylosis of the lumbar spine to include facet arthropathy over the lower lumbar spine. Mild disc space narrowing at the L5-S1 level and to lesser extent at the L4-5 level. No evidence of compression fracture or spondylolisthesis. No evidence of spondylolysis. Aorta left iliac stent graft unchanged. Vascular coil material projects over the expected region of the left internal iliac artery. Remainder of the exam is unchanged. IMPRESSION: No acute findings. 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 Electronically Signed   By: Marin Olp M.D.   On: 02/09/2019 12:16    Procedures Procedures (including critical care time)  Medications Ordered in ED Medications - No data to display   Initial Impression / Assessment and Plan / ED Course  I have reviewed the triage vital signs and the nursing notes.  Pertinent labs & imaging results that were available during my care of the patient were reviewed  by me and considered in my medical decision making (see chart for details).      pt with likely acute on chronic low back pain.  NV intact and ambulates with a steady gait.  No focal neurological deficits.  No concerning sx's for emergent neurological or infectious process.    Pt also seen by Dr. Roderic Palau.    XR shows spondylosis L4-5 and L5-S1.  Pt appears  appropriate for d/c home and I will prescribe short course of pain medication.  He will f/u with PCP. Return precautions discussed.      Final Clinical Impressions(s) / ED Diagnoses   Final diagnoses:  Acute bilateral low back pain with bilateral sciatica    ED Discharge Orders    None       Bufford Lope 02/09/19 1425    Milton Ferguson, MD 02/14/19 1911

## 2019-02-09 NOTE — ED Triage Notes (Signed)
During triage pt says he removed windows from his house and and washed them, and put windows back in.

## 2019-02-09 NOTE — ED Triage Notes (Signed)
C/o back pain, history of back pain.  Rates pain 10/10.  Called Dr Luan Pulling office this am and was told to come to ED.

## 2019-02-09 NOTE — ED Notes (Signed)
Patient transported to X-ray 

## 2019-02-12 DIAGNOSIS — J301 Allergic rhinitis due to pollen: Secondary | ICD-10-CM | POA: Diagnosis not present

## 2019-02-12 DIAGNOSIS — G20 Parkinson's disease: Secondary | ICD-10-CM | POA: Diagnosis not present

## 2019-02-12 DIAGNOSIS — I10 Essential (primary) hypertension: Secondary | ICD-10-CM | POA: Diagnosis not present

## 2019-02-13 ENCOUNTER — Other Ambulatory Visit (HOSPITAL_COMMUNITY): Payer: Self-pay | Admitting: Pulmonary Disease

## 2019-02-13 ENCOUNTER — Other Ambulatory Visit: Payer: Self-pay | Admitting: Pulmonary Disease

## 2019-02-13 DIAGNOSIS — M5440 Lumbago with sciatica, unspecified side: Secondary | ICD-10-CM

## 2019-02-16 ENCOUNTER — Other Ambulatory Visit: Payer: Self-pay

## 2019-02-16 DIAGNOSIS — H353211 Exudative age-related macular degeneration, right eye, with active choroidal neovascularization: Secondary | ICD-10-CM | POA: Diagnosis not present

## 2019-02-16 DIAGNOSIS — H35031 Hypertensive retinopathy, right eye: Secondary | ICD-10-CM | POA: Diagnosis not present

## 2019-02-16 DIAGNOSIS — H43813 Vitreous degeneration, bilateral: Secondary | ICD-10-CM | POA: Diagnosis not present

## 2019-02-16 DIAGNOSIS — H353121 Nonexudative age-related macular degeneration, left eye, early dry stage: Secondary | ICD-10-CM | POA: Diagnosis not present

## 2019-02-19 DIAGNOSIS — G20 Parkinson's disease: Secondary | ICD-10-CM | POA: Diagnosis not present

## 2019-02-19 DIAGNOSIS — Z86012 Personal history of benign carcinoid tumor: Secondary | ICD-10-CM | POA: Diagnosis not present

## 2019-02-19 DIAGNOSIS — I10 Essential (primary) hypertension: Secondary | ICD-10-CM | POA: Diagnosis not present

## 2019-02-19 DIAGNOSIS — M545 Low back pain: Secondary | ICD-10-CM | POA: Diagnosis not present

## 2019-02-21 ENCOUNTER — Other Ambulatory Visit: Payer: Self-pay

## 2019-02-21 ENCOUNTER — Ambulatory Visit (HOSPITAL_COMMUNITY)
Admission: RE | Admit: 2019-02-21 | Discharge: 2019-02-21 | Disposition: A | Discharge status: Home / Self Care | Payer: Medicare Other | Source: Ambulatory Visit | Attending: Pulmonary Disease | Admitting: Pulmonary Disease

## 2019-02-21 DIAGNOSIS — M5440 Lumbago with sciatica, unspecified side: Secondary | ICD-10-CM | POA: Insufficient documentation

## 2019-02-21 DIAGNOSIS — M545 Low back pain: Secondary | ICD-10-CM | POA: Diagnosis not present

## 2019-02-22 ENCOUNTER — Other Ambulatory Visit: Payer: Self-pay | Admitting: Pulmonary Disease

## 2019-02-22 DIAGNOSIS — G8929 Other chronic pain: Secondary | ICD-10-CM

## 2019-02-22 DIAGNOSIS — M545 Low back pain, unspecified: Secondary | ICD-10-CM

## 2019-02-28 ENCOUNTER — Ambulatory Visit
Admission: RE | Admit: 2019-02-28 | Discharge: 2019-02-28 | Disposition: A | Discharge status: Home / Self Care | Payer: Medicare Other | Source: Ambulatory Visit | Attending: Pulmonary Disease | Admitting: Pulmonary Disease

## 2019-02-28 ENCOUNTER — Other Ambulatory Visit: Payer: Self-pay

## 2019-02-28 DIAGNOSIS — M5126 Other intervertebral disc displacement, lumbar region: Secondary | ICD-10-CM | POA: Diagnosis not present

## 2019-02-28 DIAGNOSIS — G8929 Other chronic pain: Secondary | ICD-10-CM

## 2019-02-28 DIAGNOSIS — M545 Low back pain, unspecified: Secondary | ICD-10-CM

## 2019-02-28 MED ORDER — METHYLPREDNISOLONE ACETATE 40 MG/ML INJ SUSP (RADIOLOG
120.0000 mg | Freq: Once | INTRAMUSCULAR | Status: AC
  Administered 2019-02-28: 120 mg via EPIDURAL

## 2019-02-28 MED ORDER — IOPAMIDOL (ISOVUE-M 200) INJECTION 41%
1.0000 mL | Freq: Once | INTRAMUSCULAR | Status: AC
  Administered 2019-02-28: 13:00:00 1 mL via EPIDURAL

## 2019-02-28 NOTE — Discharge Instructions (Signed)

## 2019-04-09 DIAGNOSIS — I10 Essential (primary) hypertension: Secondary | ICD-10-CM | POA: Diagnosis not present

## 2019-04-09 DIAGNOSIS — J301 Allergic rhinitis due to pollen: Secondary | ICD-10-CM | POA: Diagnosis not present

## 2019-04-09 DIAGNOSIS — M545 Low back pain: Secondary | ICD-10-CM | POA: Diagnosis not present

## 2019-04-09 DIAGNOSIS — B029 Zoster without complications: Secondary | ICD-10-CM | POA: Diagnosis not present

## 2019-05-21 DIAGNOSIS — H353121 Nonexudative age-related macular degeneration, left eye, early dry stage: Secondary | ICD-10-CM | POA: Diagnosis not present

## 2019-05-21 DIAGNOSIS — H35031 Hypertensive retinopathy, right eye: Secondary | ICD-10-CM | POA: Diagnosis not present

## 2019-05-21 DIAGNOSIS — H43813 Vitreous degeneration, bilateral: Secondary | ICD-10-CM | POA: Diagnosis not present

## 2019-05-21 DIAGNOSIS — H353211 Exudative age-related macular degeneration, right eye, with active choroidal neovascularization: Secondary | ICD-10-CM | POA: Diagnosis not present

## 2019-06-05 DIAGNOSIS — K469 Unspecified abdominal hernia without obstruction or gangrene: Secondary | ICD-10-CM | POA: Diagnosis not present

## 2019-06-05 DIAGNOSIS — I10 Essential (primary) hypertension: Secondary | ICD-10-CM | POA: Diagnosis not present

## 2019-06-05 DIAGNOSIS — J302 Other seasonal allergic rhinitis: Secondary | ICD-10-CM | POA: Diagnosis not present

## 2019-06-05 DIAGNOSIS — R269 Unspecified abnormalities of gait and mobility: Secondary | ICD-10-CM | POA: Diagnosis not present

## 2019-06-05 DIAGNOSIS — K5900 Constipation, unspecified: Secondary | ICD-10-CM | POA: Diagnosis not present

## 2019-06-05 DIAGNOSIS — K219 Gastro-esophageal reflux disease without esophagitis: Secondary | ICD-10-CM | POA: Diagnosis not present

## 2019-06-05 DIAGNOSIS — F419 Anxiety disorder, unspecified: Secondary | ICD-10-CM | POA: Diagnosis not present

## 2019-06-05 DIAGNOSIS — G20 Parkinson's disease: Secondary | ICD-10-CM | POA: Diagnosis not present

## 2019-06-11 DIAGNOSIS — J449 Chronic obstructive pulmonary disease, unspecified: Secondary | ICD-10-CM | POA: Diagnosis not present

## 2019-06-11 DIAGNOSIS — G20 Parkinson's disease: Secondary | ICD-10-CM | POA: Diagnosis not present

## 2019-06-11 DIAGNOSIS — I739 Peripheral vascular disease, unspecified: Secondary | ICD-10-CM | POA: Diagnosis not present

## 2019-06-11 DIAGNOSIS — M545 Low back pain: Secondary | ICD-10-CM | POA: Diagnosis not present

## 2019-07-05 DIAGNOSIS — I10 Essential (primary) hypertension: Secondary | ICD-10-CM | POA: Diagnosis not present

## 2019-07-05 DIAGNOSIS — Z1329 Encounter for screening for other suspected endocrine disorder: Secondary | ICD-10-CM | POA: Diagnosis not present

## 2019-07-10 DIAGNOSIS — K469 Unspecified abdominal hernia without obstruction or gangrene: Secondary | ICD-10-CM | POA: Diagnosis not present

## 2019-07-10 DIAGNOSIS — G20 Parkinson's disease: Secondary | ICD-10-CM | POA: Diagnosis not present

## 2019-07-10 DIAGNOSIS — K219 Gastro-esophageal reflux disease without esophagitis: Secondary | ICD-10-CM | POA: Diagnosis not present

## 2019-07-10 DIAGNOSIS — R269 Unspecified abnormalities of gait and mobility: Secondary | ICD-10-CM | POA: Diagnosis not present

## 2019-07-10 DIAGNOSIS — J302 Other seasonal allergic rhinitis: Secondary | ICD-10-CM | POA: Diagnosis not present

## 2019-07-10 DIAGNOSIS — R718 Other abnormality of red blood cells: Secondary | ICD-10-CM | POA: Diagnosis not present

## 2019-07-10 DIAGNOSIS — M1A00X0 Idiopathic chronic gout, unspecified site, without tophus (tophi): Secondary | ICD-10-CM | POA: Diagnosis not present

## 2019-07-10 DIAGNOSIS — F419 Anxiety disorder, unspecified: Secondary | ICD-10-CM | POA: Diagnosis not present

## 2019-07-10 DIAGNOSIS — I10 Essential (primary) hypertension: Secondary | ICD-10-CM | POA: Diagnosis not present

## 2019-07-10 DIAGNOSIS — R7303 Prediabetes: Secondary | ICD-10-CM | POA: Diagnosis not present

## 2019-07-10 DIAGNOSIS — E782 Mixed hyperlipidemia: Secondary | ICD-10-CM | POA: Diagnosis not present

## 2019-07-10 DIAGNOSIS — K5900 Constipation, unspecified: Secondary | ICD-10-CM | POA: Diagnosis not present

## 2019-07-27 ENCOUNTER — Ambulatory Visit: Payer: Medicare Other | Attending: Internal Medicine

## 2019-07-27 ENCOUNTER — Other Ambulatory Visit: Payer: Self-pay

## 2019-07-27 DIAGNOSIS — Z20822 Contact with and (suspected) exposure to covid-19: Secondary | ICD-10-CM | POA: Diagnosis not present

## 2019-07-28 LAB — NOVEL CORONAVIRUS, NAA: SARS-CoV-2, NAA: NOT DETECTED

## 2019-08-02 DIAGNOSIS — D3002 Benign neoplasm of left kidney: Secondary | ICD-10-CM | POA: Diagnosis not present

## 2019-08-13 DIAGNOSIS — H353121 Nonexudative age-related macular degeneration, left eye, early dry stage: Secondary | ICD-10-CM | POA: Diagnosis not present

## 2019-08-13 DIAGNOSIS — H353211 Exudative age-related macular degeneration, right eye, with active choroidal neovascularization: Secondary | ICD-10-CM | POA: Diagnosis not present

## 2019-08-13 DIAGNOSIS — H43813 Vitreous degeneration, bilateral: Secondary | ICD-10-CM | POA: Diagnosis not present

## 2019-08-13 DIAGNOSIS — H35372 Puckering of macula, left eye: Secondary | ICD-10-CM | POA: Diagnosis not present

## 2019-08-24 DIAGNOSIS — H52203 Unspecified astigmatism, bilateral: Secondary | ICD-10-CM | POA: Diagnosis not present

## 2019-08-24 DIAGNOSIS — Z961 Presence of intraocular lens: Secondary | ICD-10-CM | POA: Diagnosis not present

## 2019-09-10 ENCOUNTER — Other Ambulatory Visit: Payer: Self-pay | Admitting: Family Medicine

## 2019-10-03 DIAGNOSIS — L989 Disorder of the skin and subcutaneous tissue, unspecified: Secondary | ICD-10-CM | POA: Diagnosis not present

## 2019-10-24 ENCOUNTER — Observation Stay (HOSPITAL_COMMUNITY): Payer: Medicare Other

## 2019-10-24 ENCOUNTER — Emergency Department (HOSPITAL_COMMUNITY): Payer: Medicare Other

## 2019-10-24 ENCOUNTER — Encounter (HOSPITAL_COMMUNITY): Payer: Self-pay

## 2019-10-24 ENCOUNTER — Other Ambulatory Visit: Payer: Self-pay

## 2019-10-24 ENCOUNTER — Observation Stay (HOSPITAL_COMMUNITY)
Admission: EM | Admit: 2019-10-24 | Discharge: 2019-10-26 | Disposition: A | Discharge status: Home / Self Care | Payer: Medicare Other | Attending: Internal Medicine | Admitting: Internal Medicine

## 2019-10-24 DIAGNOSIS — J449 Chronic obstructive pulmonary disease, unspecified: Secondary | ICD-10-CM | POA: Insufficient documentation

## 2019-10-24 DIAGNOSIS — R5381 Other malaise: Secondary | ICD-10-CM | POA: Diagnosis not present

## 2019-10-24 DIAGNOSIS — R05 Cough: Secondary | ICD-10-CM

## 2019-10-24 DIAGNOSIS — J439 Emphysema, unspecified: Secondary | ICD-10-CM

## 2019-10-24 DIAGNOSIS — K5900 Constipation, unspecified: Secondary | ICD-10-CM | POA: Diagnosis not present

## 2019-10-24 DIAGNOSIS — R1084 Generalized abdominal pain: Secondary | ICD-10-CM | POA: Diagnosis not present

## 2019-10-24 DIAGNOSIS — K56609 Unspecified intestinal obstruction, unspecified as to partial versus complete obstruction: Secondary | ICD-10-CM | POA: Diagnosis not present

## 2019-10-24 DIAGNOSIS — Z95828 Presence of other vascular implants and grafts: Secondary | ICD-10-CM | POA: Diagnosis not present

## 2019-10-24 DIAGNOSIS — F17210 Nicotine dependence, cigarettes, uncomplicated: Secondary | ICD-10-CM | POA: Diagnosis not present

## 2019-10-24 DIAGNOSIS — I10 Essential (primary) hypertension: Secondary | ICD-10-CM | POA: Diagnosis not present

## 2019-10-24 DIAGNOSIS — R059 Cough, unspecified: Secondary | ICD-10-CM

## 2019-10-24 DIAGNOSIS — Z79899 Other long term (current) drug therapy: Secondary | ICD-10-CM | POA: Diagnosis not present

## 2019-10-24 DIAGNOSIS — R109 Unspecified abdominal pain: Secondary | ICD-10-CM | POA: Diagnosis not present

## 2019-10-24 DIAGNOSIS — R52 Pain, unspecified: Secondary | ICD-10-CM | POA: Diagnosis not present

## 2019-10-24 DIAGNOSIS — R000 Tachycardia, unspecified: Secondary | ICD-10-CM | POA: Diagnosis not present

## 2019-10-24 DIAGNOSIS — Z20822 Contact with and (suspected) exposure to covid-19: Secondary | ICD-10-CM | POA: Insufficient documentation

## 2019-10-24 DIAGNOSIS — N281 Cyst of kidney, acquired: Secondary | ICD-10-CM | POA: Diagnosis not present

## 2019-10-24 LAB — CBC WITH DIFFERENTIAL/PLATELET
Abs Immature Granulocytes: 0.08 10*3/uL — ABNORMAL HIGH (ref 0.00–0.07)
Basophils Absolute: 0.1 10*3/uL (ref 0.0–0.1)
Basophils Relative: 0 %
Eosinophils Absolute: 0.1 10*3/uL (ref 0.0–0.5)
Eosinophils Relative: 0 %
HCT: 53.4 % — ABNORMAL HIGH (ref 39.0–52.0)
Hemoglobin: 17.1 g/dL — ABNORMAL HIGH (ref 13.0–17.0)
Immature Granulocytes: 0 %
Lymphocytes Relative: 6 %
Lymphs Abs: 1.2 10*3/uL (ref 0.7–4.0)
MCH: 29.4 pg (ref 26.0–34.0)
MCHC: 32 g/dL (ref 30.0–36.0)
MCV: 91.8 fL (ref 80.0–100.0)
Monocytes Absolute: 1.2 10*3/uL — ABNORMAL HIGH (ref 0.1–1.0)
Monocytes Relative: 6 %
Neutro Abs: 18.3 10*3/uL — ABNORMAL HIGH (ref 1.7–7.7)
Neutrophils Relative %: 88 %
Platelets: 263 10*3/uL (ref 150–400)
RBC: 5.82 MIL/uL — ABNORMAL HIGH (ref 4.22–5.81)
RDW: 14.7 % (ref 11.5–15.5)
WBC: 20.9 10*3/uL — ABNORMAL HIGH (ref 4.0–10.5)
nRBC: 0 % (ref 0.0–0.2)

## 2019-10-24 LAB — COMPREHENSIVE METABOLIC PANEL
ALT: 11 U/L (ref 0–44)
AST: 28 U/L (ref 15–41)
Albumin: 4.6 g/dL (ref 3.5–5.0)
Alkaline Phosphatase: 67 U/L (ref 38–126)
Anion gap: 13 (ref 5–15)
BUN: 20 mg/dL (ref 8–23)
CO2: 29 mmol/L (ref 22–32)
Calcium: 9.4 mg/dL (ref 8.9–10.3)
Chloride: 96 mmol/L — ABNORMAL LOW (ref 98–111)
Creatinine, Ser: 1.12 mg/dL (ref 0.61–1.24)
GFR calc Af Amer: >60 mL/min (ref >60)
GFR calc non Af Amer: >60 mL/min (ref >60)
Glucose, Bld: 159 mg/dL — ABNORMAL HIGH (ref 70–99)
Potassium: 3.9 mmol/L (ref 3.5–5.1)
Sodium: 138 mmol/L (ref 135–145)
Total Bilirubin: 1.2 mg/dL (ref 0.3–1.2)
Total Protein: 7.8 g/dL (ref 6.5–8.1)

## 2019-10-24 LAB — URINALYSIS, ROUTINE W REFLEX MICROSCOPIC
Bacteria, UA: NONE SEEN
Bilirubin Urine: NEGATIVE
Glucose, UA: NEGATIVE mg/dL
Ketones, ur: NEGATIVE mg/dL
Leukocytes,Ua: NEGATIVE
Nitrite: NEGATIVE
Protein, ur: 30 mg/dL — AB
Specific Gravity, Urine: >1.046 — ABNORMAL HIGH (ref 1.005–1.030)
pH: 5 (ref 5.0–8.0)

## 2019-10-24 MED ORDER — ONDANSETRON HCL 4 MG/2ML IJ SOLN: 4.0000 mg | Freq: Four times a day (QID) | INTRAMUSCULAR | Status: DC | PRN

## 2019-10-24 MED ORDER — LORAZEPAM 2 MG/ML IJ SOLN
1.0000 mg | Freq: Once | INTRAMUSCULAR | Status: AC
  Administered 2019-10-24: 1 mg via INTRAVENOUS
  Filled 2019-10-24: qty 1

## 2019-10-24 MED ORDER — ONDANSETRON HCL 4 MG PO TABS: 4.0000 mg | ORAL_TABLET | Freq: Four times a day (QID) | ORAL | Status: DC | PRN

## 2019-10-24 MED ORDER — SODIUM CHLORIDE 0.9 % IV BOLUS
1000.0000 mL | Freq: Once | INTRAVENOUS | Status: AC
  Administered 2019-10-24: 1000 mL via INTRAVENOUS

## 2019-10-24 MED ORDER — IOHEXOL 300 MG/ML  SOLN
100.0000 mL | Freq: Once | INTRAMUSCULAR | Status: AC | PRN
  Administered 2019-10-24: 16:00:00 100 mL via INTRAVENOUS

## 2019-10-24 MED ORDER — ONDANSETRON HCL 4 MG/2ML IJ SOLN
4.0000 mg | Freq: Once | INTRAMUSCULAR | Status: AC
  Administered 2019-10-24: 17:00:00 4 mg via INTRAVENOUS
  Filled 2019-10-24: qty 2

## 2019-10-24 MED ORDER — HEPARIN SODIUM (PORCINE) 5000 UNIT/ML IJ SOLN
5000.0000 [IU] | Freq: Three times a day (TID) | INTRAMUSCULAR | Status: DC
  Administered 2019-10-24 – 2019-10-26 (×5): 5000 [IU] via SUBCUTANEOUS
  Filled 2019-10-24 (×5): qty 1

## 2019-10-24 MED ORDER — ALBUTEROL SULFATE (2.5 MG/3ML) 0.083% IN NEBU: 2.5000 mg | INHALATION_SOLUTION | RESPIRATORY_TRACT | Status: DC | PRN

## 2019-10-24 MED ORDER — ORAL CARE MOUTH RINSE
15.0000 mL | Freq: Two times a day (BID) | OROMUCOSAL | Status: DC
  Administered 2019-10-24 – 2019-10-26 (×4): 15 mL via OROMUCOSAL

## 2019-10-24 MED ORDER — DEXTROSE-NACL 5-0.9 % IV SOLN: INTRAVENOUS | Status: DC

## 2019-10-24 MED ORDER — LORAZEPAM 2 MG/ML IJ SOLN
0.5000 mg | Freq: Two times a day (BID) | INTRAMUSCULAR | Status: DC | PRN
  Administered 2019-10-25: 0.5 mg via INTRAVENOUS
  Filled 2019-10-24: qty 1

## 2019-10-24 NOTE — ED Provider Notes (Signed)
[facility] EMERGENCY DEPARTMENT Provider Note   CSN: [id number] Arrival date & time: 10/24/19  1329     History Chief Complaint  Patient presents with  . Abdominal Pain    Brett Russell is a 74 y.o. male.  HPI HPI Comments: Brett Russell is a 74 y.o. male with history of chronic constipation who presents to the Emergency Department complaining of 3 days of moderate worsening diffuse abdominal pain.  Patient states he has been chronically constipated since having a AAA repaired in 2007.  He typically takes stool softener daily, but he is unable to specify which one to me.  When his constipation worsens he typically takes Ex-Lax which alleviates his symptoms.  He states he has not had a bowel movement for 6 days and his abdomen is becoming increasingly distended.  He took 2 doses of Ex-Lax last night and then again this morning and denies any relief.  He reports a history of left renal carcinoma repair, cholecystectomy, appendectomy.  He denies fevers, chills, chest pain, shortness of breath, leg swelling, URI symptoms, urinary changes, syncope.    Past Medical History:  Diagnosis Date  . AAA (abdominal aortic aneurysm) ([place])   . Allergy    takes Allegra daily  . Anxiety    takes Xanax nightly  . Aortic aneurysm ([place]) 2007  . Arthritis   . COPD (chronic obstructive pulmonary disease) ([place])   . Enlarged prostate    slightly  . GERD (gastroesophageal reflux disease)    takes Omeprazole daily  . Gout    takes Allopurinol nightly  . History of bronchitis    many yrs ago  . History of colon polyps    benign  . History of kidney stones    hx of  . Hypertension    takes Amlodipine daily  . Insomnia    takes Melatonin nightly  . Macular degeneration    wet-right eye .Injection of AvAStin every 10 wks  . Parkinson disease ([place]) 2006   takes Sinemet daily  . 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  Positive colorectal cancer screening using Cologuard test 08/16/2018  . 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 GERD (gastroesophageal reflux disease) 10/03/2012  . Gout 10/02/2012  . Parkinson disease ([place]) 10/02/2012  . HTN (hypertension) 10/02/2012  . Constipation 10/02/2012  . Hemorrhoids 10/02/2012  . Anxiety 10/02/2012    Past Surgical History:  Procedure Laterality Date  . ABDOMINAL AORTIC ANEURYSM REPAIR  2007  . ABDOMINAL AORTIC ENDOVASCULAR STENT GRAFT Left 12/03/2016   Procedure: INSERTION OF LEFT COMMON  ILIAC STENT-LEFT INTERNAL ILIAC  ARTERY COILING;  Surgeon: Rosetta Posner, MD;  Location: [place];  Service: Vascular;  Laterality: Left;  . APPENDECTOMY    . CHOLECYSTECTOMY    . COLONOSCOPY N/A 11/29/2012   Procedure: COLONOSCOPY;  Surgeon: Rogene Houston, MD;  [facility];  Service: Endoscopy;  Laterality: N/A;  1200-moved to [place] notified pt  . COLONOSCOPY N/A 09/20/2018   Procedure: COLONOSCOPY;  Surgeon: Rogene Houston, MD;  [facility];  Service: Endoscopy;  Laterality: N/A;  2:25  . KIDNEY SURGERY  2007   Tumor removed  . POLYPECTOMY  09/20/2018   Procedure: POLYPECTOMY;  Surgeon: Rogene Houston, MD;  [facility];  Service:  Endoscopy;;  colon  . Right knee     Cartilage removed at age 53       Family History  Problem Relation Age of Onset  . Kidney disease Father   . Heart disease Father   . AAA (abdominal aortic aneurysm) Father   . Healthy Brother   . Healthy Daughter   . Constipation Daughter   . Healthy Daughter     Social History   Tobacco Use  . Smoking status: Current Every Day Smoker    Packs/day: 1.00    Years: 49.00    Pack years: 49.00    Types: Cigarettes    Start date: 08/11/1969  . Smokeless tobacco: Never Used  Substance Use Topics  . Alcohol use: No    Alcohol/week: 0.0 standard drinks  . Drug use: No    Home  Medications Prior to Admission medications   Medication Sig Start Date End Date Taking? Authorizing Provider  acetaminophen (TYLENOL) 500 MG tablet Take 500-1,000 mg by mouth every 6 (six) hours as needed (for pain).    [provider]  allopurinol (ZYLOPRIM) 300 MG tablet Take 300 mg by mouth at bedtime.     [provider]  ALPRAZolam Duanne Moron) 1 MG tablet Take 1 mg by mouth 2 (two) times daily.     [provider]  amLODipine (NORVASC) 10 MG tablet Take 10 mg by mouth at bedtime.     [provider]  Bevacizumab (AVASTIN IV) Inject 3.75 mg/mL into the vein every 3 (three) months. Every 12 weeks    [provider]  carbidopa-levodopa (SINEMET IR) 25-100 MG per tablet Take 1 tablet by mouth 2 (two) times daily.    [provider]  fexofenadine (ALLEGRA) 180 MG tablet Take 180 mg by mouth daily as needed (for allergies.).     [provider]  HYDROcodone-acetaminophen (NORCO/VICODIN) 5-325 MG tablet Take one tab po q 4 hrs prn pain 02/09/19   Triplett, Tammy, PA-C  Melatonin 1 MG TABS Take 2 mg by mouth at bedtime as needed (sleep).     [provider]  omeprazole (PRILOSEC) 20 MG capsule Take 20 mg by mouth daily before breakfast.     [provider]  ondansetron (ZOFRAN-ODT) 4 MG disintegrating tablet Take 4 mg by mouth 4 (four) times daily as needed for nausea or vomiting. 07/10/18   [provider]    Allergies    Asa [aspirin] and Morphine and related  Review of Systems   Review of Systems  All other systems reviewed and are negative.  Ten systems reviewed and are negative for acute change, except as noted in the HPI.   Physical Exam Updated Vital Signs BP 119/73 (BP Location: Right Arm)   Pulse 94   Temp (!) 97.4 F (36.3 C) (Oral)   Resp 12   Ht 5\' 8"  (1.727 m)   Wt 79.8 kg   SpO2 94%   BMI 26.76 kg/m   Physical Exam Vitals and nursing note reviewed.  Constitutional:      General: He  is not in acute distress.    Appearance: He is well-developed. He is not ill-appearing, toxic-appearing or diaphoretic.  HENT:     Head: Normocephalic and atraumatic.     Mouth/Throat:     Mouth: Mucous membranes are moist.  Eyes:     General: No scleral icterus.    Extraocular Movements: Extraocular movements intact.     Pupils: Pupils are equal, round, and reactive to light.  Cardiovascular:     Rate and Rhythm: Normal rate and regular rhythm.     Heart sounds: Normal heart sounds. No murmur. No friction rub. No gallop.   Pulmonary:     Effort: Pulmonary effort is normal. No respiratory distress.     Breath sounds: No stridor. Wheezing (Diffuse expiratory wheezes noted in the posterior lung fields bilaterally) present. No rhonchi or rales.  Chest:     Chest wall: No tenderness.  Abdominal:     General: Abdomen is protuberant. Bowel sounds are decreased. There is distension.     Tenderness: There is generalized abdominal tenderness (Very mild generalized TTP).     Hernia: A hernia is present.     Comments: Distended abdomen.  Linear vertical midline incisional scar that is well-healed from a prior AAA repair.  Incisional hernia noted just superior to the umbilicus.  Skin:    General: Skin is warm and dry.     Capillary Refill: Capillary refill takes less than 2 seconds.  Neurological:     General: No focal deficit present.     Mental Status: He is alert and oriented to person, place, and time.  Psychiatric:        Mood and Affect: Mood normal.        Behavior: Behavior normal.    ED Results / Procedures / Treatments   Labs (all labs ordered are listed, but only abnormal results are displayed) Labs Reviewed  COMPREHENSIVE METABOLIC PANEL - Abnormal; Notable for the following components:      Result Value   Chloride 96 (*)    Glucose, Bld 159 (*)    All other components within normal limits  CBC WITH DIFFERENTIAL/PLATELET - Abnormal; Notable for the following components:    WBC 20.9 (*)    RBC 5.82 (*)    Hemoglobin 17.1 (*)    HCT 53.4 (*)    Neutro Abs 18.3 (*)    Monocytes Absolute 1.2 (*)    Abs Immature Granulocytes 0.08 (*)    All other components within normal limits   EKG None  Radiology DG Abdomen 1 View  Result Date: 10/24/2019 CLINICAL DATA:  Diffuse abdominal pain. Constipation. EXAM: ABDOMEN - 1 VIEW COMPARISON:  CT scan of the abdomen dated 01/04/2017 FINDINGS: There are multiple slightly distended small bowel loops in the mid abdomen. The colon and stomach are not distended. Extensive aortic atherosclerosis. Cholecystectomy. Stents in the left iliac artery. Occlusion coils in the left internal iliac artery. No significant bone abnormality. IMPRESSION: Findings consistent with  small bowel obstruction. Electronically Signed   By: Lorriane Shire M.D.   On: 10/24/2019 14:50    Procedures Procedures   Medications Ordered in ED Medications  iohexol (OMNIPAQUE) 300 MG/ML solution 100 mL (100 mLs Intravenous Contrast Given 10/24/19 1542)    ED Course  I have reviewed the triage vital signs and the nursing notes.  Pertinent labs & imaging results that were available during my care of the patient were reviewed by me and considered in my medical decision making (see chart for details).    MDM Rules/Calculators/A&P                      2:17 PM patient is a pleasant 74 year old man that presents with worsening diffuse abdominal pain and constipation.  He has a longstanding history of constipation and is taking stool softeners and Ex-Lax without relief.  He denies any nausea or vomiting.  No decrease in p.o. intake.  He states his symptoms are consistent with prior bouts of constipation.  Will obtain basic labs and x-ray of the abdomen.  Will obtain an ECG due to his significant cardiac history.  Will reassess.  3:54 PM leukocytosis at 20.9 with neutrophilia at 18.3.  Initial DG of the abdomen shows possible small bowel obstruction.  CT of the  abdomen pelvis with IV contrast has been ordered.    3:54 PM It is the end of my shift and patient will be transferred to [facility] who will assume his care moving forward.  I discussed the above with with the patient and his questions were answered. He was amicable at the end of my shift.   Final Clinical Impression(s) / ED Diagnoses Final diagnoses:  Constipation, unspecified constipation type   Rx / DC Orders ED Discharge Orders    None       Rayna Sexton, PA-C 10/24/19 1608    Noemi Chapel, MD 10/26/19 [phone]

## 2019-10-24 NOTE — ED Provider Notes (Signed)
Pt care assumed from [facility], PA-C at shift change. See his note for full H&P.   Per his note, "Brett Russell is a 74 y.o. male with history of chronic constipation who presents to the Emergency Department complaining of 3 days of moderate worsening diffuse abdominal pain.  Patient states he has been chronically constipated since having a AAA repaired in 2007.  He typically takes stool softener daily, but he is unable to specify which one to me.  When his constipation worsens he typically takes Ex-Lax which alleviates his symptoms.  He states he has not had a bowel movement for 6 days and his abdomen is becoming increasingly distended.  He took 2 doses of Ex-Lax last night and then again this morning and denies any relief.  He reports a history of left renal carcinoma repair, cholecystectomy, appendectomy.  He denies fevers, chills, chest pain, shortness of breath, leg swelling, URI symptoms, urinary changes, syncope."  Physical Exam  BP 119/73 (BP Location: Right Arm)   Pulse 94   Temp (!) 97.4 F (36.3 C) (Oral)   Resp 12   Ht 5\' 8"  (1.727 m)   Wt 79.8 kg   SpO2 94%   BMI 26.76 kg/m   Physical Exam Constitutional:      General: He is not in acute distress.    Appearance: He is well-developed.  Eyes:     Conjunctiva/sclera: Conjunctivae normal.  Cardiovascular:     Rate and Rhythm: Normal rate and regular rhythm.  Pulmonary:     Effort: Pulmonary effort is normal.     Breath sounds: Normal breath sounds.  Abdominal:     General: Abdomen is protuberant. There is distension.     Comments: Denies abd TTP but is intermittently guarding diffusely  Skin:    General: Skin is warm and dry.  Neurological:     Mental Status: He is alert and oriented to person, place, and time.       ED Course/Procedures     Procedures Results for orders placed or performed during the hospital encounter of 10/24/19  Comprehensive metabolic panel  Result Value Ref Range   Sodium 138 135 -  145 mmol/L   Potassium 3.9 3.5 - 5.1 mmol/L   Chloride 96 (L) 98 - 111 mmol/L   CO2 29 22 - 32 mmol/L   Glucose, Bld 159 (H) 70 - 99 mg/dL   BUN 20 8 - 23 mg/dL   Creatinine, Ser 1.12 0.61 - 1.24 mg/dL   Calcium 9.4 8.9 - 10.3 mg/dL   Total Protein 7.8 6.5 - 8.1 g/dL   Albumin 4.6 3.5 - 5.0 g/dL   AST 28 15 - 41 U/L   ALT 11 0 - 44 U/L   Alkaline Phosphatase 67 38 - 126 U/L   Total Bilirubin 1.2 0.3 - 1.2 mg/dL   GFR calc non Af Amer >60 >60 mL/min   GFR calc Af Amer >60 >60 mL/min   Anion gap 13 5 - 15  CBC with Differential  Result Value Ref Range   WBC 20.9 (H) 4.0 - 10.5 K/uL   RBC 5.82 (H) 4.22 - 5.81 MIL/uL   Hemoglobin 17.1 (H) 13.0 - 17.0 g/dL   HCT 53.4 (H) 39.0 - 52.0 %   MCV 91.8 80.0 - 100.0 fL   MCH 29.4 26.0 - 34.0 pg   MCHC 32.0 30.0 - 36.0 g/dL   RDW 14.7 11.5 - 15.5 %   Platelets 263 150 - 400 K/uL  nRBC 0.0 0.0 - 0.2 %   Neutrophils Relative % 88 %   Neutro Abs 18.3 (H) 1.7 - 7.7 K/uL   Lymphocytes Relative 6 %   Lymphs Abs 1.2 0.7 - 4.0 K/uL   Monocytes Relative 6 %   Monocytes Absolute 1.2 (H) 0.1 - 1.0 K/uL   Eosinophils Relative 0 %   Eosinophils Absolute 0.1 0.0 - 0.5 K/uL   Basophils Relative 0 %   Basophils Absolute 0.1 0.0 - 0.1 K/uL   Immature Granulocytes 0 %   Abs Immature Granulocytes 0.08 (H) 0.00 - 0.07 K/uL   DG Abdomen 1 View  Result Date: 10/24/2019 CLINICAL DATA:  Diffuse abdominal pain. Constipation. EXAM: ABDOMEN - 1 VIEW COMPARISON:  CT scan of the abdomen dated 01/04/2017 FINDINGS: There are multiple slightly distended small bowel loops in the mid abdomen. The colon and stomach are not distended. Extensive aortic atherosclerosis. Cholecystectomy. Stents in the left iliac artery. Occlusion coils in the left internal iliac artery. No significant bone abnormality. IMPRESSION: Findings consistent with  small bowel obstruction. Electronically Signed   By: Lorriane Shire M.D.   On: 10/24/2019 14:50   CT ABDOMEN PELVIS W  CONTRAST  Result Date: 10/24/2019 CLINICAL DATA:  Abdominal pain. No BM for 6 days EXAM: CT ABDOMEN AND PELVIS WITH CONTRAST TECHNIQUE: Multidetector CT imaging of the abdomen and pelvis was performed using the standard protocol following bolus administration of intravenous contrast. CONTRAST:  161mL OMNIPAQUE IOHEXOL 300 MG/ML  SOLN COMPARISON:  None. FINDINGS: Lower chest: Parenchymal band within the posterior right lung base is likely postinflammatory. Scar versus subsegmental atelectasis noted within the lingula. Hepatobiliary: Several small, less than 1 cm low-density foci within left lobe of liver noted, too small to characterize. Previous cholecystectomy. No biliary dilatation. Pancreas: Unremarkable. No pancreatic ductal dilatation or surrounding inflammatory changes. Spleen: Normal in size without focal abnormality. Adrenals/Urinary Tract: Normal appearance of the adrenal glands. Bilateral kidney cysts. The largest cyst arises from the inferior pole of left kidney measuring 4.5 cm, image 68/5. Exophytic low-density structure arising from inferior pole of the right kidney measures 9 mm and is technically too small to reliably characterize. No hydronephrosis identified bilaterally. Stomach/Bowel: The stomach is distended. The small bowel loops are dilated measuring up to 3.5 cm and there are multiple air-fluid levels. Transition to decreased caliber distal small bowel loops noted within the right abdomen. Distal colonic diverticula noted without acute inflammation. Vascular/Lymphatic: Aortic atherosclerosis. No aneurysm. No abdominopelvic adenopathy identified. Multiple small supraumbilical ventral abdominal wall hernias contains fat only. Reproductive: Prostate is unremarkable. Other: No free fluid or fluid collections Musculoskeletal: Lumbar degenerative disc disease. L5-S1 degenerative disc disease. IMPRESSION: 1. Findings compatible with small bowel obstruction. Transition point is identified within the  right abdomen. 2. Bilateral kidney cysts. Aortic Atherosclerosis (ICD10-I70.0). Electronically Signed   By: Kerby Moors M.D.   On: 10/24/2019 16:18    MDM   Briefly, 74 y/o pt with a h/o multiple abd surgeries presenting for eval of abd pain and distension for the last 3 days.  Reviewed/interpreted labs CBC with leukocytosis of 20k, hgb elevated at 17.1 CMP with no significant electrolyte derangements, normal kidney and liver function  Imaging consistent with SBO on the right side of the abdomen.   4:35 PM CONSULT with Dr. Constance Haw with general surgery who recommends admission to the hospitalist service.  She recommends  placing NG tube and keeping the patient n.p.o.  4:57 PM CONSULT with Dr. Denton Brick who accepts the patient for admission.  Bishop Dublin 10/24/19 1726    Lajean Saver, MD 10/24/19 1840

## 2019-10-24 NOTE — ED Triage Notes (Signed)
Pt with abdominal pain. Has not had a BM in the last 6 days. Pt has been trying to stay hydrated. Has tried numerous laxatives without success. History of umbilical hernia.

## 2019-10-24 NOTE — Progress Notes (Signed)
[facility]  Reviewed CT and see transition point in R mid abdomen, there is a decompressed segment of bowel too that looks a little thick but could just be from decompression. I will review with radiology tomorrow.  Does have leukocytosis and also looks concentrated with Hgb and Hct elevated and Cr elevated.   ED reports distention but no overt tenderness on exam.   Hospitalist admission. NPO NG Will review images with radiology tomorrow May plan for SBFT if not improving   Curlene Labrum, MD [facility] [address] [place], [place] [address] [phone] (office)

## 2019-10-24 NOTE — H&P (Signed)
History and Physical    Brett Russell [id number] DOB: 09/06/1945 DOA: 10/24/2019  PCP: Brett Du, MD   Patient coming from: Home  Chief Complaint: Bowel movement in 6 days  HPI: Brett Russell is a 74 y.o. male with medical history significant for hypertension, AAA, COPD, gout, Parkinson's. Patient presented to the ED with complaints of not having a bowel movement in about 6 days.  Reports progressive abdominal bloating.  He denies vomiting.  Reports episodes of intermittent constipation which improve with Ex-Lax.  He takes stool softeners consistently.  Reports he has had bowel issues since his AAA surgery in 2007.  He has a chronic cough productive of whitish sputum, he tells me this is mostly unchanged but on my evaluation he seems to be coughing quite a bit, but NG tube insertion had just been attempted.  He denies difficulty breathing.  No chest pain.  No fever no chills.  ED Course: Heart rate 94-116, ED nurse tells me patient's O2 sats were initially down to 88% on room air, he was placed on 2 L, but now he is down to 90% on room air.  WBC 20.9.  Abdominal CT with contrast-findings compatible with small bowel obstruction, transition point identified within the right abdomen. EDP to general surgery, will see in a.m.  NG tube ordered, but could not be placed after 3 attempts.  Review of Systems: As per HPI all other systems reviewed and negative.  Past Medical History:  Diagnosis Date  . AAA (abdominal aortic aneurysm) ([place])   . Allergy    takes Allegra daily  . Anxiety    takes Xanax nightly  . Aortic aneurysm ([place]) 2007  . Arthritis   . COPD (chronic obstructive pulmonary disease) ([place])   . Enlarged prostate    slightly  . GERD (gastroesophageal reflux disease)    takes Omeprazole daily  . Gout    takes Allopurinol nightly  . History of bronchitis    many yrs ago  . History of colon polyps    benign  . History of kidney stones    hx of  . Hypertension    takes Amlodipine daily  . Insomnia    takes Melatonin nightly  . Macular degeneration    wet-right eye .Injection of AvAStin every 10 wks  . Parkinson disease ([place]) 2006   takes Sinemet daily  . 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 ABDOMINAL AORTIC ANEURYSM REPAIR  2007  . ABDOMINAL AORTIC ENDOVASCULAR STENT GRAFT Left 12/03/2016   Procedure: INSERTION OF LEFT COMMON  ILIAC STENT-LEFT INTERNAL ILIAC  ARTERY COILING;  Surgeon: Rosetta Posner, MD;  Location: [facility];  Service: Vascular;  Laterality: Left;  . APPENDECTOMY    . CHOLECYSTECTOMY    . COLONOSCOPY N/A 11/29/2012   Procedure: COLONOSCOPY;  Surgeon: Rogene Houston, MD;  [facility];  Service: Endoscopy;  Laterality: N/A;  1200-moved to [place] notified pt  . COLONOSCOPY N/A 09/20/2018   Procedure: COLONOSCOPY;  Surgeon: Rogene Houston, MD;  [facility];  Service: Endoscopy;  Laterality: N/A;  2:25  . KIDNEY SURGERY  2007   Tumor removed  . POLYPECTOMY  09/20/2018   Procedure: POLYPECTOMY;  Surgeon: Rogene Houston, MD;  [facility];  Service: Endoscopy;;  colon  . Right knee     Cartilage removed at age 69  reports that he has been smoking cigarettes. He started smoking about 50 years ago. He has a 49.00 pack-year smoking history. He has never used smokeless tobacco. He reports that he does not drink alcohol or use drugs.  Allergies  Allergen Reactions  . Asa [Aspirin] Other (See Comments)    Severe nosebleeds in the past.   . Morphine And Related     Hallucinations/attempt to elope from the hospital    Family History  Problem Relation Age of Onset  . Kidney disease Father   . Heart disease Father   . AAA (abdominal aortic aneurysm) Father   . Healthy Brother   . Healthy Daughter   . Constipation Daughter   . Healthy Daughter     Prior to Admission medications   Medication Sig Start Date End Date  Taking? Authorizing Provider  acetaminophen (TYLENOL) 500 MG tablet Take 500-1,000 mg by mouth every 6 (six) hours as needed (for pain).   Yes [provider]  allopurinol (ZYLOPRIM) 300 MG tablet Take 300 mg by mouth at bedtime.    Yes [provider]  ALPRAZolam Duanne Moron) 1 MG tablet Take 0.5 mg by mouth in the morning and at bedtime. *May take one tablet by mouth three times daily. May take one tablet at bedtime   Yes [provider]  amLODipine (NORVASC) 10 MG tablet Take 10 mg by mouth at bedtime.    Yes [provider]  Bevacizumab (AVASTIN IV) Inject 3.75 mg/mL into the vein See admin instructions. Every 14 weeks (3.75/.57ml   Yes [provider]  carbidopa-levodopa (SINEMET IR) 25-100 MG per tablet Take 1 tablet by mouth in the morning and at bedtime.    Yes [provider]  fexofenadine (ALLEGRA) 180 MG tablet Take 180 mg by mouth daily as needed (for allergies.).    Yes [provider]  Melatonin 1 MG TABS Take 2 mg by mouth at bedtime.    Yes [provider]  omeprazole (PRILOSEC) 20 MG capsule Take 20 mg by mouth daily before breakfast.    Yes [provider]  ondansetron (ZOFRAN-ODT) 4 MG disintegrating tablet Take 4 mg by mouth 4 (four) times daily as needed for nausea or vomiting. 07/10/18  Yes [provider]    Physical Exam: Vitals:   10/24/19 1332 10/24/19 1334 10/24/19 1347  BP:  119/73   Pulse:  94   Resp:  12   Temp:  (!) 97.4 F (36.3 C)   TempSrc:  Oral   SpO2:  91% 94%  Weight: 79.8 kg    Height: 5\' 8"  (1.727 m)      Constitutional: Coughing significantly, productive cough, after trial of NG tube. Vitals:   10/24/19 1332 10/24/19 1334 10/24/19 1347  BP:  119/73   Pulse:  94   Resp:  12   Temp:  (!) 97.4 F (36.3 C)   TempSrc:  Oral   SpO2:  91% 94%  Weight: 79.8 kg    Height: 5\' 8"  (1.727 m)     Eyes: PERRL, lids and conjunctivae normal ENMT: Mucous membranes are  moist.  Neck: normal, supple, no masses, no thyromegaly Respiratory: clear to auscultation bilaterally, no wheezing, no crackles. Normal respiratory effort. No accessory muscle use.  Cardiovascular: Regular rate and rhythm, no murmurs / rubs / gallops. No extremity edema. 2+ pedal pulses.  Abdomen: no tenderness, no masses palpated. No hepatosplenomegaly. Bowel sounds positive.  Musculoskeletal: no clubbing / cyanosis. No joint deformity upper and lower extremities.  Good ROM, no contractures. Normal muscle tone.  Skin: no rashes, lesions, ulcers. No induration Neurologic: No apparent cranial nerve abnormality, moving all extremities spontaneously Psychiatric: Normal judgment and insight. Alert and oriented x 3. Normal mood.   Labs on Admission: I have personally reviewed following labs and imaging studies  CBC: Recent Labs  Lab 10/24/19 1412  WBC 20.9*  NEUTROABS 18.3*  HGB 17.1*  HCT 53.4*  MCV 91.8  PLT 99991111   Basic Metabolic Panel: Recent Labs  Lab 10/24/19 1412  NA 138  K 3.9  CL 96*  CO2 29  GLUCOSE 159*  BUN 20  CREATININE 1.12  CALCIUM 9.4   Liver Function Tests: Recent Labs  Lab 10/24/19 1412  AST 28  ALT 11  ALKPHOS 67  BILITOT 1.2  PROT 7.8  ALBUMIN 4.6    Radiological Exams on Admission: DG Abdomen 1 View  Result Date: 10/24/2019 CLINICAL DATA:  Diffuse abdominal pain. Constipation. EXAM: ABDOMEN - 1 VIEW COMPARISON:  CT scan of the abdomen dated 01/04/2017 FINDINGS: There are multiple slightly distended small bowel loops in the mid abdomen. The colon and stomach are not distended. Extensive aortic atherosclerosis. Cholecystectomy. Stents in the left iliac artery. Occlusion coils in the left internal iliac artery. No significant bone abnormality. IMPRESSION: Findings consistent with  small bowel obstruction. Electronically Signed   By: Lorriane Shire M.D.   On: 10/24/2019 14:50   CT ABDOMEN PELVIS W CONTRAST  Result Date: 10/24/2019 CLINICAL DATA:   Abdominal pain. No BM for 6 days EXAM: CT ABDOMEN AND PELVIS WITH CONTRAST TECHNIQUE: Multidetector CT imaging of the abdomen and pelvis was performed using the standard protocol following bolus administration of intravenous contrast. CONTRAST:  165mL OMNIPAQUE IOHEXOL 300 MG/ML  SOLN COMPARISON:  None. FINDINGS: Lower chest: Parenchymal band within the posterior right lung base is likely postinflammatory. Scar versus subsegmental atelectasis noted within the lingula. Hepatobiliary: Several small, less than 1 cm low-density foci within left lobe of liver noted, too small to characterize. Previous cholecystectomy. No biliary dilatation. Pancreas: Unremarkable. No pancreatic ductal dilatation or surrounding inflammatory changes. Spleen: Normal in size without focal abnormality. Adrenals/Urinary Tract: Normal appearance of the adrenal glands. Bilateral kidney cysts. The largest cyst arises from the inferior pole of left kidney measuring 4.5 cm, image 68/5. Exophytic low-density structure arising from inferior pole of the right kidney measures 9 mm and is technically too small to reliably characterize. No hydronephrosis identified bilaterally. Stomach/Bowel: The stomach is distended. The small bowel loops are dilated measuring up to 3.5 cm and there are multiple air-fluid levels. Transition to decreased caliber distal small bowel loops noted within the right abdomen. Distal colonic diverticula noted without acute inflammation. Vascular/Lymphatic: Aortic atherosclerosis. No aneurysm. No abdominopelvic adenopathy identified. Multiple small supraumbilical ventral abdominal wall hernias contains fat only. Reproductive: Prostate is unremarkable. Other: No free fluid or fluid collections Musculoskeletal: Lumbar degenerative disc disease. L5-S1 degenerative disc disease. IMPRESSION: 1. Findings compatible with small bowel obstruction. Transition point is identified within the right abdomen. 2. Bilateral kidney cysts. Aortic  Atherosclerosis (ICD10-I70.0). Electronically Signed   By: Kerby Moors M.D.   On: 10/24/2019 16:18    EKG: Independently reviewed.  Sinus tachycardia, rate 103, QTC 443.  No significant ST or T wave changes compared to prior.  Assessment/Plan Active Problems:   SBO (small bowel obstruction) (H[facility])   Small bowel obstruction-as seen on CT with transition points in the right abdomen.  Prior surgeries- AAA repair, appendectomy, cholecystectomy.  No bowel movements in  about 6 days, lifting.  No vomiting.  Leukocytosis of 20.9.  At this time no focus of infection identified. - general surgery consulted by ED provider. -3 failed NG tube attempts will hold for now -N.p.o. - 1 l bolus given, continue D5 N/s 75cc/hr - BMP a.m - CBC a.m -Obtain UA  COPD- currently on room air sats greater than 90%.  Chronic productive cough.  Port chest xray acute abnormality.  Lung exam unremarkable. -Albuterol inhaler PRN.  AAA- s/p AAA repair 2007, left internal iliac artery coiling 2018. Follows with vascular surgeon Dr. Donnetta Hutching.  Hypertension- Soft to stable.  -Hold home Norvasc  History of gout-stable. -Hold home allopurinol while NPO.  Parkinson's -Hold home Sinemet  Anxiety-reports consistent Xanax 0.5 to 1 mg twice daily every day. -While n.p.o., IV Ativan 0.5 mg every 12 hourly as needed.  History of renal cancer-status post partial nephrectomy. -  ?? IV bevacizumab q14 days as listed on home med list.   DVT prophylaxis: Heparin.  Discontinue if planned surgery. Code Status: Full code Family Communication: None at bedside.   Disposition Plan: ~ 2 days.  Pending improvement in bowel obstruction. Consults called: General surgery Admission status: Observation, MedSurg    Bethena Roys MD [facility]  10/24/2019, 8:32 PM

## 2019-10-25 DIAGNOSIS — J439 Emphysema, unspecified: Secondary | ICD-10-CM | POA: Diagnosis not present

## 2019-10-25 DIAGNOSIS — K56609 Unspecified intestinal obstruction, unspecified as to partial versus complete obstruction: Secondary | ICD-10-CM

## 2019-10-25 DIAGNOSIS — K5900 Constipation, unspecified: Secondary | ICD-10-CM | POA: Diagnosis not present

## 2019-10-25 LAB — CBC
HCT: 44.1 % (ref 39.0–52.0)
Hemoglobin: 13.7 g/dL (ref 13.0–17.0)
MCH: 29 pg (ref 26.0–34.0)
MCHC: 31.1 g/dL (ref 30.0–36.0)
MCV: 93.4 fL (ref 80.0–100.0)
Platelets: 196 10*3/uL (ref 150–400)
RBC: 4.72 MIL/uL (ref 4.22–5.81)
RDW: 14.7 % (ref 11.5–15.5)
WBC: 12.5 10*3/uL — ABNORMAL HIGH (ref 4.0–10.5)
nRBC: 0 % (ref 0.0–0.2)

## 2019-10-25 LAB — BASIC METABOLIC PANEL
Anion gap: 9 (ref 5–15)
BUN: 18 mg/dL (ref 8–23)
CO2: 29 mmol/L (ref 22–32)
Calcium: 8.4 mg/dL — ABNORMAL LOW (ref 8.9–10.3)
Chloride: 101 mmol/L (ref 98–111)
Creatinine, Ser: 0.78 mg/dL (ref 0.61–1.24)
GFR calc Af Amer: >60 mL/min (ref >60)
GFR calc non Af Amer: >60 mL/min (ref >60)
Glucose, Bld: 98 mg/dL (ref 70–99)
Potassium: 3.9 mmol/L (ref 3.5–5.1)
Sodium: 139 mmol/L (ref 135–145)

## 2019-10-25 LAB — SARS CORONAVIRUS 2 (TAT 6-24 HRS): SARS Coronavirus 2: NEGATIVE

## 2019-10-25 MED ORDER — ALPRAZOLAM 0.5 MG PO TABS
0.5000 mg | ORAL_TABLET | Freq: Three times a day (TID) | ORAL | Status: DC | PRN
  Administered 2019-10-25: 0.5 mg via ORAL
  Filled 2019-10-25: qty 1

## 2019-10-25 NOTE — TOC Transition Note (Signed)
Transition of Care [facility]) - CM/SW Discharge Note   Patient Details  Name: Brett Russell MRN: [id number] Date of Birth: [birth year]  [facility] CM/SW Contact:  Sherie Don, LCSW Phone Number: 10/25/2019, 2:15 PM  Clinical Narrative: TOC notified patient is in need of home O2 prior to discharge. CSW called Juliann Pulse with [employer] to make a referral for home O2. TOC signing off.  Final next level of care: Home/Self Care Barriers to Discharge: Barriers Resolved  Discharge Plan and Services        DME Arranged: Oxygen DME Agency: [employer] Date DME Agency Contacted: 10/25/19 Time DME Agency Contacted: [phone] Representative spoke with at DME Agency: Blake Divine  Readmission Risk Interventions No flowsheet data found.

## 2019-10-25 NOTE — Plan of Care (Signed)

## 2019-10-25 NOTE — Progress Notes (Signed)
SATURATION QUALIFICATIONS: (This note is used to comply with regulatory documentation for home oxygen)  Patient Saturations on Room Air at Rest = 92  Patient Saturations on Room Air while Ambulating = 87  Patient Saturations on 2 Liters of oxygen while Ambulating = 94  Please briefly explain why patient needs home oxygen: To maintain 02 sat at 90% or above during ambulation.   DTat

## 2019-10-25 NOTE — Discharge Summary (Addendum)
Physician Discharge Summary  Brett Russell [id number] DOB: 17-Mar-1946 DOA: 10/24/2019  PCP: Sinda Du, MD  Admit date: 10/24/2019 Discharge date: 10/26/2019  Admitted From: Home Disposition:  Home   Recommendations for Outpatient Follow-up:  1. Follow up with PCP in 1-2 weeks 2. Please obtain BMP/CBC in one week   Home Health: YES Equipment/Devices: 2L  Discharge Condition: Stable CODE STATUS: FULL Diet recommendation: Heart Healthy    Brief/Interim Summary: 74 year old male with a history of hypertension, COPD, Parkinson's disease, AAA, anxiety presenting with constipation type symptoms, abdominal bloating and distention, and no BM x6 days.  The patient states that he normally struggles with constipation and has to use some type of laxative to have a bowel movement.  The patient used stool softeners and Ex-Lax x2 without success.  As result, he presented for further evaluation.  He had denied any fevers, chills, chest pain, diarrhea.  He did have an episode of nausea and vomiting.  CT of the abdomen and pelvis showed SBO with transition point in the right abdomen.  NG tube was ordered, but was unsuccessful only attempted x3.  General surgery was consulted to assist.  Fortunately, the patient stated that he had 4 bowel movements on the evening of 10/24/2019.  The case was discussed with general surgery, Dr. Constance Haw.  She felt that the patient's diet could be advanced, and if he tolerated that the patient could be discharged.  Notably, the patient was noted to be hypoxic with ambulation.  The patient continues to smoke 1 to 2 packs/day x 50 years.  He did particular complain of any worsening shortness of breath.  He has a chronic cough without hemoptysis.  He denies any fevers or chills.  There is no orthopnea or PND type symptoms.  He denies any peripheral edema.  Ambulatory pulse ox was performed on room air with desaturation to 87%.  Home oxygen was set up for  discharge.   Discharge Diagnoses:   Small bowel obstruction -Improved with medical management -Patient was placed on bowel rest and intravenous fluids initially -Diet was advanced which the patient tolerated -He had multiple bowel movements on the evening for 4/7-4/8  COPD -Patient has new oxygen requirement -Patient has been likely hypoxic for a period of time even prior to this hospitalization -Ambulatory pulse ox showed desaturation to 87% on room air -2 L nasal cannula set up for discharge  Essential hypertension -Restart amlodipine  Anxiety -Continue home dose alprazolam  GERD -Continue PPI  AAA- s/p AAA repair 2007, left internal iliac artery coiling 2018. Follows with vascular surgeon Dr. Donnetta Hutching.  Parkinson's disease -Continue Sinemet  Discharge Instructions   Allergies as of 10/26/2019      Reactions   Asa [aspirin] Other (See Comments)   Severe nosebleeds in the past.    Morphine And Related    Hallucinations/attempt to elope from the hospital   Flu Virus Vaccine    Rash       Medication List    TAKE these medications   acetaminophen 500 MG tablet Commonly known as: TYLENOL Take 500-1,000 mg by mouth every 6 (six) hours as needed (for pain).   allopurinol 300 MG tablet Commonly known as: ZYLOPRIM Take 300 mg by mouth at bedtime.   ALPRAZolam 1 MG tablet Commonly known as: XANAX Take 0.5 mg by mouth in the morning and at bedtime. *May take one tablet by mouth three times daily. May take one tablet at bedtime   amLODipine 10 MG tablet Commonly known  as: NORVASC Take 10 mg by mouth at bedtime.   AVASTIN IV Inject 3.75 mg/mL into the vein See admin instructions. Every 14 weeks (3.75/.36ml   carbidopa-levodopa 25-100 MG tablet Commonly known as: SINEMET IR Take 1 tablet by mouth in the morning and at bedtime.   fexofenadine 180 MG tablet Commonly known as: ALLEGRA Take 180 mg by mouth daily as needed (for allergies.).   melatonin 1 MG Tabs  tablet Take 2 mg by mouth at bedtime.   omeprazole 20 MG capsule Commonly known as: PRILOSEC Take 20 mg by mouth daily before breakfast.   ondansetron 4 MG disintegrating tablet Commonly known as: ZOFRAN-ODT Take 4 mg by mouth 4 (four) times daily as needed for nausea or vomiting.   polyethylene glycol 17 g packet Commonly known as: MIRALAX / GLYCOLAX Take 17 g by mouth daily.   senna 8.6 MG Tabs tablet Commonly known as: SENOKOT Take 2 tablets (17.2 mg total) by mouth daily.            Durable Medical Equipment  (From admission, onward)         Start     Ordered   10/25/19 1532  For home use only DME oxygen  Once    Question Answer Comment  Length of Need 6 Months   Mode or (Route) Nasal cannula   Liters per Minute 2   Frequency Continuous (stationary and portable oxygen unit needed)   Oxygen conserving device Yes   Oxygen delivery system Gas      10/25/19 1531          Allergies  Allergen Reactions   Asa [Aspirin] Other (See Comments)    Severe nosebleeds in the past.    Morphine And Related     Hallucinations/attempt to elope from the hospital   Flu Virus Vaccine     Rash     Consultations:  General surgery   Procedures/Studies: DG Abdomen 1 View  Result Date: 10/24/2019 CLINICAL DATA:  Diffuse abdominal pain. Constipation. EXAM: ABDOMEN - 1 VIEW COMPARISON:  CT scan of the abdomen dated 01/04/2017 FINDINGS: There are multiple slightly distended small bowel loops in the mid abdomen. The colon and stomach are not distended. Extensive aortic atherosclerosis. Cholecystectomy. Stents in the left iliac artery. Occlusion coils in the left internal iliac artery. No significant bone abnormality. IMPRESSION: Findings consistent with  small bowel obstruction. Electronically Signed   By: Lorriane Shire M.D.   On: 10/24/2019 14:50   CT ABDOMEN PELVIS W CONTRAST  Result Date: 10/24/2019 CLINICAL DATA:  Abdominal pain. No BM for 6 days EXAM: CT ABDOMEN AND  PELVIS WITH CONTRAST TECHNIQUE: Multidetector CT imaging of the abdomen and pelvis was performed using the standard protocol following bolus administration of intravenous contrast. CONTRAST:  163mL OMNIPAQUE IOHEXOL 300 MG/ML  SOLN COMPARISON:  None. FINDINGS: Lower chest: Parenchymal band within the posterior right lung base is likely postinflammatory. Scar versus subsegmental atelectasis noted within the lingula. Hepatobiliary: Several small, less than 1 cm low-density foci within left lobe of liver noted, too small to characterize. Previous cholecystectomy. No biliary dilatation. Pancreas: Unremarkable. No pancreatic ductal dilatation or surrounding inflammatory changes. Spleen: Normal in size without focal abnormality. Adrenals/Urinary Tract: Normal appearance of the adrenal glands. Bilateral kidney cysts. The largest cyst arises from the inferior pole of left kidney measuring 4.5 cm, image 68/5. Exophytic low-density structure arising from inferior pole of the right kidney measures 9 mm and is technically too small to reliably characterize. No hydronephrosis identified  bilaterally. Stomach/Bowel: The stomach is distended. The small bowel loops are dilated measuring up to 3.5 cm and there are multiple air-fluid levels. Transition to decreased caliber distal small bowel loops noted within the right abdomen. Distal colonic diverticula noted without acute inflammation. Vascular/Lymphatic: Aortic atherosclerosis. No aneurysm. No abdominopelvic adenopathy identified. Multiple small supraumbilical ventral abdominal wall hernias contains fat only. Reproductive: Prostate is unremarkable. Other: No free fluid or fluid collections Musculoskeletal: Lumbar degenerative disc disease. L5-S1 degenerative disc disease. IMPRESSION: 1. Findings compatible with small bowel obstruction. Transition point is identified within the right abdomen. 2. Bilateral kidney cysts. Aortic Atherosclerosis (ICD10-I70.0). Electronically Signed    By: Kerby Moors M.D.   On: 10/24/2019 16:18   DG CHEST PORT 1 VIEW  Result Date: 10/24/2019 CLINICAL DATA:  Productive cough, hypoxia EXAM: PORTABLE CHEST 1 VIEW COMPARISON:  12/27/2007 FINDINGS: Heart and mediastinal contours are within normal limits. No focal opacities or effusions. No acute bony abnormality. IMPRESSION: No active disease. Electronically Signed   By: Rolm Baptise M.D.   On: 10/24/2019 18:56        Discharge Exam: Vitals:   10/25/19 2105 10/26/19 0527  BP: 116/71 (!) 132/58  Pulse: 74 60  Resp: 15 16  Temp: 98.4 F (36.9 C) 98.5 F (36.9 C)  SpO2: 96% 91%   Vitals:   10/25/19 0622 10/25/19 1422 10/25/19 2105 10/26/19 0527  BP: 98/72 110/73 116/71 (!) 132/58  Pulse: 86 80 74 60  Resp: 20 16 15 16   Temp: 99.8 F (37.7 C)  98.4 F (36.9 C) 98.5 F (36.9 C)  TempSrc: Oral  Oral Oral  SpO2: 95% 97% 96% 91%  Weight:      Height:        General: Pt is alert, awake, not in acute distress Cardiovascular: RRR, S1/S2 +, no rubs, no gallops Respiratory: diminished BS,  Bibasilar rales. No wheeze Abdominal: Soft, NT, ND, bowel sounds + Extremities: no edema, no cyanosis   The results of significant diagnostics from this hospitalization (including imaging, microbiology, ancillary and laboratory) are listed below for reference.    Significant Diagnostic Studies: DG Abdomen 1 View  Result Date: 10/24/2019 CLINICAL DATA:  Diffuse abdominal pain. Constipation. EXAM: ABDOMEN - 1 VIEW COMPARISON:  CT scan of the abdomen dated 01/04/2017 FINDINGS: There are multiple slightly distended small bowel loops in the mid abdomen. The colon and stomach are not distended. Extensive aortic atherosclerosis. Cholecystectomy. Stents in the left iliac artery. Occlusion coils in the left internal iliac artery. No significant bone abnormality. IMPRESSION: Findings consistent with  small bowel obstruction. Electronically Signed   By: Lorriane Shire M.D.   On: 10/24/2019 14:50   CT  ABDOMEN PELVIS W CONTRAST  Result Date: 10/24/2019 CLINICAL DATA:  Abdominal pain. No BM for 6 days EXAM: CT ABDOMEN AND PELVIS WITH CONTRAST TECHNIQUE: Multidetector CT imaging of the abdomen and pelvis was performed using the standard protocol following bolus administration of intravenous contrast. CONTRAST:  174mL OMNIPAQUE IOHEXOL 300 MG/ML  SOLN COMPARISON:  None. FINDINGS: Lower chest: Parenchymal band within the posterior right lung base is likely postinflammatory. Scar versus subsegmental atelectasis noted within the lingula. Hepatobiliary: Several small, less than 1 cm low-density foci within left lobe of liver noted, too small to characterize. Previous cholecystectomy. No biliary dilatation. Pancreas: Unremarkable. No pancreatic ductal dilatation or surrounding inflammatory changes. Spleen: Normal in size without focal abnormality. Adrenals/Urinary Tract: Normal appearance of the adrenal glands. Bilateral kidney cysts. The largest cyst arises from the inferior pole of  left kidney measuring 4.5 cm, image 68/5. Exophytic low-density structure arising from inferior pole of the right kidney measures 9 mm and is technically too small to reliably characterize. No hydronephrosis identified bilaterally. Stomach/Bowel: The stomach is distended. The small bowel loops are dilated measuring up to 3.5 cm and there are multiple air-fluid levels. Transition to decreased caliber distal small bowel loops noted within the right abdomen. Distal colonic diverticula noted without acute inflammation. Vascular/Lymphatic: Aortic atherosclerosis. No aneurysm. No abdominopelvic adenopathy identified. Multiple small supraumbilical ventral abdominal wall hernias contains fat only. Reproductive: Prostate is unremarkable. Other: No free fluid or fluid collections Musculoskeletal: Lumbar degenerative disc disease. L5-S1 degenerative disc disease. IMPRESSION: 1. Findings compatible with small bowel obstruction. Transition point is  identified within the right abdomen. 2. Bilateral kidney cysts. Aortic Atherosclerosis (ICD10-I70.0). Electronically Signed   By: Kerby Moors M.D.   On: 10/24/2019 16:18   DG CHEST PORT 1 VIEW  Result Date: 10/24/2019 CLINICAL DATA:  Productive cough, hypoxia EXAM: PORTABLE CHEST 1 VIEW COMPARISON:  12/27/2007 FINDINGS: Heart and mediastinal contours are within normal limits. No focal opacities or effusions. No acute bony abnormality. IMPRESSION: No active disease. Electronically Signed   By: Rolm Baptise M.D.   On: 10/24/2019 18:56     Microbiology: Recent Results (from the past 240 hour(s))  SARS CORONAVIRUS 2 (Avinash Maltos 6-24 HRS) Nasopharyngeal Nasopharyngeal Swab     Status: None   Collection Time: 10/24/19  4:45 PM   Specimen: Nasopharyngeal Swab  Result Value Ref Range Status   SARS Coronavirus 2 NEGATIVE NEGATIVE Final    Comment: (NOTE) SARS-CoV-2 target nucleic acids are NOT DETECTED. The SARS-CoV-2 RNA is generally detectable in upper and lower respiratory specimens during the acute phase of infection. Negative results do not preclude SARS-CoV-2 infection, do not rule out co-infections with other pathogens, and should not be used as the sole basis for treatment or other patient management decisions. Negative results must be combined with clinical observations, patient history, and epidemiological information. The expected result is Negative. Fact Sheet for Patients: SugarRoll.be Fact Sheet for Healthcare Providers: https://www.woods-mathews.com/ This test is not yet approved or cleared by the [place] FDA and  has been authorized for detection and/or diagnosis of SARS-CoV-2 by FDA under an Emergency Use Authorization (EUA). This EUA will remain  in effect (meaning this test can be used) for the duration of the COVID-19 declaration under Section 56 4(b)(1) of the Act, 21 U.S.C. section 360bbb-3(b)(1), unless the authorization is  terminated or revoked sooner. Performed at [facility], [place] [address], [place], [place] [address]      Labs: Basic Metabolic Panel: Recent Labs  Lab 10/24/19 1412 10/24/19 1412 10/25/19 0612 10/26/19 0738  NA 138  --  139 139  K 3.9   < > 3.9 3.8  CL 96*  --  101 103  CO2 29  --  29 29  GLUCOSE 159*  --  98 99  BUN 20  --  18 9  CREATININE 1.12  --  0.78 0.68  CALCIUM 9.4  --  8.4* 8.8*  MG  --   --   --  2.0  PHOS  --   --   --  2.1*   < > = values in this interval not displayed.   Liver Function Tests: Recent Labs  Lab 10/24/19 1412  AST 28  ALT 11  ALKPHOS 67  BILITOT 1.2  PROT 7.8  ALBUMIN 4.6   No results for input(s): LIPASE,  AMYLASE in the last 168 hours. No results for input(s): AMMONIA in the last 168 hours. CBC: Recent Labs  Lab 10/24/19 1412 10/25/19 0612 10/26/19 0738  WBC 20.9* 12.5* 8.1  NEUTROABS 18.3*  --   --   HGB 17.1* 13.7 14.0  HCT 53.4* 44.1 44.9  MCV 91.8 93.4 93.2  PLT 263 196 204   Cardiac Enzymes: No results for input(s): CKTOTAL, CKMB, CKMBINDEX, TROPONINI in the last 168 hours. BNP: Invalid input(s): POCBNP CBG: No results for input(s): GLUCAP in the last 168 hours.  Time coordinating discharge:  36 minutes  Signed:  Orson Eva, DO [facility] Pager: [phone] 10/26/2019, 10:01 AM

## 2019-10-25 NOTE — Progress Notes (Addendum)
PROGRESS NOTE  Brett Russell [id number] DOB: 16-Sep-1945 DOA: 10/24/2019 PCP: Sinda Du, MD  Brief History:  74 year old male with a history of hypertension, COPD, Parkinson's disease, AAA, anxiety presenting with constipation type symptoms, abdominal bloating and distention, and no BM x6 days.  The patient states that he normally struggles with constipation and has to use some type of laxative to have a bowel movement.  The patient used stool softeners and Ex-Lax x2 without success.  As result, he presented for further evaluation.  He had denied any fevers, chills, chest pain, diarrhea.  He did have an episode of nausea and vomiting.  CT of the abdomen and pelvis showed SBO with transition point in the right abdomen.  NG tube was ordered, but was unsuccessful only attempted x3.  General surgery was consulted to assist.  Fortunately, the patient stated that he had 4 bowel movements on the evening of 10/24/2019.  The case was discussed with general surgery, Dr. Constance Haw.  She felt that the patient's diet could be advanced, and if he tolerated that the patient could be discharged.  Notably, the patient was noted to be hypoxic with ambulation.  The patient continues to smoke 1 to 2 packs/day x 50 years.  He did particular complain of any worsening shortness of breath.  He has a chronic cough without hemoptysis.  He denies any fevers or chills.  There is no orthopnea or PND type symptoms.  He denies any peripheral edema.  Ambulatory pulse ox was performed on room air with desaturation to 87%.  Home oxygen was set up for discharge.  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 Followswith vascular surgeon Dr. Donnetta Hutching.  Parkinson's disease -Continue Sinemet     Disposition Plan: Patient From: Home D/C Place: Home 4/9 if tolerates diet Barriers: Not Clinically Stable--diet tolerance  Family Communication:   Daughter updated at bedside 4/8  Consultants:  General surgery  Code Status:  FULL   DVT Prophylaxis:  [place] Heparin    Procedures: As Listed in Progress Note Above  Antibiotics: None    Total time spent 35 minutes.  Greater than 50% spent face to face counseling and coordinating care.    Subjective: Pt had BM last night.  Denies abd pain.  Tolerating clears. Denies f/c, cp, n/v/d.  He has intermittent sob and dry cough.  No hemoptysis. Passing flatus  Objective: Vitals:   10/24/19 1930 10/24/19 2000 10/25/19 0622 10/25/19 1422  BP: 113/77 113/67 98/72 110/73  Pulse: 100 (!) 102 86 80  Resp: (!) 32 (!) 25 20 16   Temp:  99 F (37.2 C) 99.8 F (37.7 C)   TempSrc:  Oral Oral   SpO2: 93% 90% 95% 97%  Weight:      Height:        Intake/Output Summary (Last 24 hours) at 10/25/2019 1726 Last data filed at 10/25/2019 0000 Gross per 24 hour  Intake 238.75 ml  Output 50 ml  Net 188.75 ml   Weight change:  Exam:   General:  Pt is alert, follows commands appropriately, not in acute distress  HEENT: No icterus, No thrush, No neck mass, [place]/AT  Cardiovascular: RRR, S1/S2, no rubs, no gallops  Respiratory: diminished breath sounds. No wheeze  Abdomen: Soft/+BS, non tender, non distended, no guarding  Extremities: No edema, No lymphangitis, No petechiae, No rashes, no synovitis   Data Reviewed: I have personally reviewed following labs  and imaging studies Basic Metabolic Panel: Recent Labs  Lab 10/24/19 1412 10/25/19 0612  NA 138 139  K 3.9 3.9  CL 96* 101  CO2 29 29  GLUCOSE 159* 98  BUN 20 18  CREATININE 1.12 0.78  CALCIUM 9.4 8.4*   Liver Function Tests: Recent Labs  Lab 10/24/19 1412  AST 28  ALT 11  ALKPHOS 67  BILITOT 1.2  PROT 7.8  ALBUMIN 4.6   No results for input(s): LIPASE, AMYLASE in the last 168 hours. No results for input(s): AMMONIA in the last 168 hours. Coagulation Profile: No results for input(s): INR, PROTIME in the last 168 hours. CBC: Recent Labs  Lab 10/24/19 1412 10/25/19 0612  WBC 20.9* 12.5*  NEUTROABS 18.3*  --   HGB 17.1* 13.7  HCT 53.4* 44.1  MCV 91.8 93.4  PLT 263 196   Cardiac Enzymes: No results for input(s): CKTOTAL, CKMB, CKMBINDEX, TROPONINI in the last 168 hours. BNP: Invalid input(s): POCBNP CBG: No results for input(s): GLUCAP in the last 168 hours. HbA1C: No results for input(s): HGBA1C in the last 72 hours. Urine analysis:    Component Value Date/Time   COLORURINE YELLOW 10/24/2019 2033   APPEARANCEUR CLEAR 10/24/2019 2033   LABSPEC >1.046 (H) 10/24/2019 2033   PHURINE 5.0 10/24/2019 2033   GLUCOSEU NEGATIVE 10/24/2019 2033   HGBUR SMALL (A) 10/24/2019 2033   [place] NEGATIVE 10/24/2019 2033   [place] NEGATIVE 10/24/2019 2033   PROTEINUR 30 (A) 10/24/2019 2033   NITRITE NEGATIVE 10/24/2019 2033   LEUKOCYTESUR NEGATIVE 10/24/2019 2033   Sepsis Labs: @LABRCNTIP (procalcitonin:4,lacticidven:4) ) Recent Results (from the past 240 hour(s))  SARS CORONAVIRUS 2 ([place] 6-24 HRS) Nasopharyngeal Nasopharyngeal Swab     Status: None   Collection Time: 10/24/19  4:45 PM   Specimen: Nasopharyngeal Swab  Result Value Ref Range Status   SARS Coronavirus 2 NEGATIVE NEGATIVE Final    Comment: (NOTE) SARS-CoV-2 target nucleic acids are NOT DETECTED. The SARS-CoV-2 RNA is generally detectable in upper and lower respiratory specimens during the acute  phase of infection. Negative results do not preclude SARS-CoV-2 infection, do not rule out co-infections with other pathogens, and should not be used as the sole basis for treatment or other patient management decisions. Negative results must be combined with clinical observations, patient history, and epidemiological information. The expected result is Negative. Fact Sheet for Patients: SugarRoll.be Fact Sheet for Healthcare Providers: https://www.woods-mathews.com/ This test is not yet approved or cleared by the [place] FDA and  has been authorized for detection and/or diagnosis of SARS-CoV-2 by FDA under an Emergency Use Authorization (EUA). This EUA will remain  in effect (meaning this test can be used) for the duration of the COVID-19 declaration under Section 56 4(b)(1) of the Act, 21 U.S.C. section 360bbb-3(b)(1), unless the authorization is terminated or revoked sooner. Performed at [facility], [place] [address], [place], [place] [address]      Scheduled Meds: . heparin  5,000 Units Subcutaneous Q8H  . mouth rinse  15 mL Mouth Rinse BID   Continuous Infusions: . dextrose 5 % and 0.9% NaCl 75 mL/hr at 10/25/19 0944    Procedures/Studies: DG Abdomen 1 View  Result Date: 10/24/2019 CLINICAL DATA:  Diffuse abdominal pain. Constipation. EXAM: ABDOMEN - 1 VIEW COMPARISON:  CT scan of the abdomen dated 01/04/2017 FINDINGS: There are multiple slightly distended small bowel loops in the mid abdomen. The colon and stomach are not distended. Extensive aortic atherosclerosis. Cholecystectomy. Stents in the left iliac artery. Occlusion coils in the left internal iliac artery. No significant bone abnormality. IMPRESSION: Findings consistent with  small bowel obstruction. Electronically Signed   By: Lorriane Shire M.D.   On: 10/24/2019 14:50   CT ABDOMEN PELVIS W CONTRAST  Result Date: 10/24/2019 CLINICAL DATA:  Abdominal pain. No BM for  6 days EXAM: CT ABDOMEN AND PELVIS WITH CONTRAST TECHNIQUE: Multidetector CT imaging of the abdomen and pelvis was performed using the standard protocol following bolus administration of intravenous contrast. CONTRAST:  119mL OMNIPAQUE IOHEXOL 300 MG/ML  SOLN COMPARISON:  None. FINDINGS: Lower chest: Parenchymal band within the posterior right lung base is likely postinflammatory. Scar versus subsegmental atelectasis noted within the lingula. Hepatobiliary: Several small, less than 1 cm low-density foci within left lobe of liver noted, too small to characterize. Previous cholecystectomy. No biliary dilatation. Pancreas: Unremarkable. No pancreatic ductal dilatation or surrounding inflammatory changes. Spleen: Normal in size without focal abnormality. Adrenals/Urinary Tract: Normal appearance of the adrenal glands. Bilateral kidney cysts. The largest cyst arises from the inferior pole of left kidney measuring 4.5 cm, image 68/5. Exophytic low-density structure arising from inferior pole of the right kidney measures 9 mm and is technically too small to reliably characterize. No hydronephrosis identified bilaterally. Stomach/Bowel: The stomach is distended. The small bowel loops are dilated measuring up to 3.5 cm and there are multiple air-fluid levels. Transition to decreased caliber distal small bowel loops noted within the right abdomen. Distal colonic diverticula noted without acute inflammation. Vascular/Lymphatic: Aortic atherosclerosis. No aneurysm. No abdominopelvic adenopathy identified. Multiple small supraumbilical ventral abdominal wall hernias contains fat only. Reproductive: Prostate is unremarkable. Other: No free fluid or fluid collections Musculoskeletal: Lumbar degenerative disc disease. L5-S1 degenerative disc disease. IMPRESSION: 1. Findings compatible with small bowel obstruction. Transition point is identified within the right abdomen. 2. Bilateral kidney cysts. Aortic Atherosclerosis  (ICD10-I70.0). Electronically Signed   By: Kerby Moors M.D.   On: 10/24/2019 16:18   DG CHEST PORT 1 VIEW  Result Date: 10/24/2019 CLINICAL DATA:  Productive cough, hypoxia EXAM: PORTABLE CHEST 1 VIEW COMPARISON:  12/27/2007 FINDINGS: Heart and mediastinal contours are within normal limits. No focal opacities or effusions. No acute bony abnormality. IMPRESSION: No active disease. Electronically Signed   By: Rolm Baptise M.D.   On: 10/24/2019 18:56    Orson Eva, DO  [facility]  If 7PM-7AM, please contact night-coverage [email or web address] Password [email or web address] 10/25/2019, 5:26 PM   LOS: 0 days

## 2019-10-25 NOTE — Care Management Obs Status (Signed)
[place] NOTIFICATION   Patient Details  Name: Brett Russell MRN: [id number] Date of Birth: 06-25-1946   Medicare Observation Status Notification Given:  Yes    Tommy Medal 10/25/2019, 12:52 PM

## 2019-10-25 NOTE — Consult Note (Signed)
[facility] Consult  Reason for Consult: Small bowel obstruction  Referring Physician:  Dr. Carles Collet   Chief Complaint    Abdominal Pain      HPI: Brett Russell is a 74 y.o. male with a prior history of an open AAA repair in 2007 who reports that he has had issues with his bowel since then and had been told he would have issues with his bowels. He has a BM every few days and takes stool softener regularly but has to take an Ex lax chocolate to have a BM. He comes in with complaints of abdominal pain and some bloating and nausea. He says he has not vomited. He had not used the restroom in over 5 days. He had done his normal Ex lax and had not had success. He says that he came to the ED due to the pain that started about 24 hrs prior to his admission.  He was admitted to the hospital and an NG was attempted but failed. He ultimately had BMs without having to have further management. He says that his belly pain is resolving and he wants to try a diet.  He says he does not think he will be able to have another BM while in the hospital as he does not normally go more than every few days.He has had no associated fevers.   Past Medical History:  Diagnosis Date  . AAA (abdominal aortic aneurysm) ([place])   . Allergy    takes Allegra daily  . Anxiety    takes Xanax nightly  . Aortic aneurysm ([place]) 2007  . Arthritis   . COPD (chronic obstructive pulmonary disease) ([place])   . Enlarged prostate    slightly  . GERD (gastroesophageal reflux disease)    takes Omeprazole daily  . Gout    takes Allopurinol nightly  . History of bronchitis    many yrs ago  . History of colon polyps    benign  . History of kidney stones    hx of  . Hypertension    takes Amlodipine daily  . Insomnia    takes Melatonin nightly  . Macular degeneration    wet-right eye .Injection of AvAStin every 10 wks  . Parkinson disease ([place]) 2006   takes Sinemet daily  . 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 and had to have a blood  transfusion    Past Surgical History:  Procedure Laterality Date  . ABDOMINAL AORTIC ANEURYSM REPAIR  2007  . ABDOMINAL AORTIC ENDOVASCULAR STENT GRAFT Left 12/03/2016   Procedure: INSERTION OF LEFT COMMON  ILIAC STENT-LEFT INTERNAL ILIAC  ARTERY COILING;  Surgeon: Rosetta Posner, MD;  Location: [facility];  Service: Vascular;  Laterality: Left;  . APPENDECTOMY    . CHOLECYSTECTOMY    . COLONOSCOPY N/A 11/29/2012   Procedure: COLONOSCOPY;  Surgeon: Rogene Houston, MD;  [facility];  Service: Endoscopy;  Laterality: N/A;  1200-moved to [place] notified pt  . COLONOSCOPY N/A 09/20/2018   Procedure: COLONOSCOPY;  Surgeon: Rogene Houston, MD;  [facility];  Service: Endoscopy;  Laterality: N/A;  2:25  . KIDNEY SURGERY  2007   Tumor removed  . POLYPECTOMY  09/20/2018   Procedure: POLYPECTOMY;  Surgeon: Rogene Houston, MD;  [facility];  Service: Endoscopy;;  colon  . Right knee     Cartilage removed at age 45    Family History  Problem Relation Age of Onset  .  Kidney disease Father   . Heart disease Father   . AAA (abdominal aortic aneurysm) Father   . Healthy Brother   . Healthy Daughter   . Constipation Daughter   . Healthy Daughter     Social History   Tobacco Use  . Smoking status: Current Every Day Smoker    Packs/day: 1.00    Years: 49.00    Pack years: 49.00    Types: Cigarettes    Start date: 08/11/1969  . Smokeless tobacco: Never Used  Substance Use Topics  . Alcohol use: No    Alcohol/week: 0.0 standard drinks  . Drug use: No    Medications: I have reviewed the patient's current medications.  Allergies  Allergen Reactions  . Asa [Aspirin] Other (See Comments)    Severe nosebleeds in the past.   . Morphine And Related     Hallucinations/attempt to elope from the hospital  . Flu Virus Vaccine     Rash      ROS:  A comprehensive review of systems was negative except for: Gastrointestinal: positive for abdominal pain,  constipation, nausea and bloating  Blood pressure 98/72, pulse 86, temperature 99.8 F (37.7 C), temperature source Oral, resp. rate 20, height 5\' 8"  (1.727 m), weight 79.8 kg, SpO2 95 %. Physical Exam Vitals reviewed.  Constitutional:      Appearance: He is normal weight.  HENT:     Head: Normocephalic and atraumatic.  Eyes:     Extraocular Movements: Extraocular movements intact.  Cardiovascular:     Rate and Rhythm: Normal rate.  Pulmonary:     Effort: Pulmonary effort is normal.  Abdominal:     General: There is no distension.     Palpations: Abdomen is soft.     Tenderness: There is no abdominal tenderness.     Hernia: A hernia is present. Hernia is present in the umbilical area.     Comments: Reducible hernia, soft  Musculoskeletal:     Comments: Moves all extremities   Skin:    General: Skin is warm and dry.  Neurological:     General: No focal deficit present.     Mental Status: He is alert and oriented to person, place, and time.  Psychiatric:        Mood and Affect: Mood normal.        Behavior: Behavior normal.     Results: Results for orders placed or performed during the hospital encounter of 10/24/19 (from the past 48 hour(s))  Comprehensive metabolic panel     Status: Abnormal   Collection Time: 10/24/19  2:12 PM  Result Value Ref Range   Sodium 138 135 - 145 mmol/L   Potassium 3.9 3.5 - 5.1 mmol/L   Chloride 96 (L) 98 - 111 mmol/L   CO2 29 22 - 32 mmol/L   Glucose, Bld 159 (H) 70 - 99 mg/dL    Comment: Glucose reference range applies only to samples taken after fasting for at least 8 hours.   BUN 20 8 - 23 mg/dL   Creatinine, Ser 1.12 0.61 - 1.24 mg/dL   Calcium 9.4 8.9 - 10.3 mg/dL   Total Protein 7.8 6.5 - 8.1 g/dL   Albumin 4.6 3.5 - 5.0 g/dL   AST 28 15 - 41 U/L   ALT 11 0 - 44 U/L   Alkaline Phosphatase 67 38 - 126 U/L   Total Bilirubin 1.2 0.3 - 1.2 mg/dL   GFR calc non Af Amer >60 >60 mL/min  GFR calc Af Amer >60 >60 mL/min   Anion gap  13 5 - 15    Comment: Performed at [facility], [address], [place], [place] [address]  CBC with Differential     Status: Abnormal   Collection Time: 10/24/19  2:12 PM  Result Value Ref Range   WBC 20.9 (H) 4.0 - 10.5 K/uL   RBC 5.82 (H) 4.22 - 5.81 MIL/uL   Hemoglobin 17.1 (H) 13.0 - 17.0 g/dL   HCT 53.4 (H) 39.0 - 52.0 %   MCV 91.8 80.0 - 100.0 fL   MCH 29.4 26.0 - 34.0 pg   MCHC 32.0 30.0 - 36.0 g/dL   RDW 14.7 11.5 - 15.5 %   Platelets 263 150 - 400 K/uL   nRBC 0.0 0.0 - 0.2 %   Neutrophils Relative % 88 %   Neutro Abs 18.3 (H) 1.7 - 7.7 K/uL   Lymphocytes Relative 6 %   Lymphs Abs 1.2 0.7 - 4.0 K/uL   Monocytes Relative 6 %   Monocytes Absolute 1.2 (H) 0.1 - 1.0 K/uL   Eosinophils Relative 0 %   Eosinophils Absolute 0.1 0.0 - 0.5 K/uL   Basophils Relative 0 %   Basophils Absolute 0.1 0.0 - 0.1 K/uL   Immature Granulocytes 0 %   Abs Immature Granulocytes 0.08 (H) 0.00 - 0.07 K/uL    Comment: Performed at [facility], [address], [place], [place] [address]  SARS CORONAVIRUS 2 (TAT 6-24 HRS) Nasopharyngeal Nasopharyngeal Swab     Status: None   Collection Time: 10/24/19  4:45 PM   Specimen: Nasopharyngeal Swab  Result Value Ref Range   SARS Coronavirus 2 NEGATIVE NEGATIVE    Comment: (NOTE) SARS-CoV-2 target nucleic acids are NOT DETECTED. The SARS-CoV-2 RNA is generally detectable in upper and lower respiratory specimens during the acute phase of infection. Negative results do not preclude SARS-CoV-2 infection, do not rule out co-infections with other pathogens, and should not be used as the sole basis for treatment or other patient management decisions. Negative results must be combined with clinical observations, patient history, and epidemiological information. The expected result is Negative. Fact Sheet for Patients: SugarRoll.be Fact Sheet for Healthcare Providers: https://www.woods-mathews.com/ This test is not  yet approved or cleared by the [place] FDA and  has been authorized for detection and/or diagnosis of SARS-CoV-2 by FDA under an Emergency Use Authorization (EUA). This EUA will remain  in effect (meaning this test can be used) for the duration of the COVID-19 declaration under Section 56 4(b)(1) of the Act, 21 U.S.C. section 360bbb-3(b)(1), unless the authorization is terminated or revoked sooner. Performed at [facility], [place] [address] [place],  [address]   Urinalysis, Routine w reflex microscopic     Status: Abnormal   Collection Time: 10/24/19  8:33 PM  Result Value Ref Range   Color, Urine YELLOW YELLOW   APPearance CLEAR CLEAR   Specific Gravity, Urine >1.046 (H) 1.005 - 1.030   pH 5.0 5.0 - 8.0   Glucose, UA NEGATIVE NEGATIVE mg/dL   Hgb urine dipstick SMALL (A) NEGATIVE   Bilirubin Urine NEGATIVE NEGATIVE   Ketones, ur NEGATIVE NEGATIVE mg/dL   Protein, ur 30 (A) NEGATIVE mg/dL   Nitrite NEGATIVE NEGATIVE   Leukocytes,Ua NEGATIVE NEGATIVE   RBC / HPF 6-10 0 - 5 RBC/hpf   WBC, UA 0-5 0 - 5 WBC/hpf   Bacteria, UA NONE SEEN NONE SEEN   Mucus PRESENT     Comment:  Performed at [facility], [address], [place], [place] [id number]  Basic metabolic panel     Status: Abnormal   Collection Time: 10/25/19  6:12 AM  Result Value Ref Range   Sodium 139 135 - 145 mmol/L   Potassium 3.9 3.5 - 5.1 mmol/L   Chloride 101 98 - 111 mmol/L   CO2 29 22 - 32 mmol/L   Glucose, Bld 98 70 - 99 mg/dL    Comment: Glucose reference range applies only to samples taken after fasting for at least 8 hours.   BUN 18 8 - 23 mg/dL   Creatinine, Ser 0.78 0.61 - 1.24 mg/dL   Calcium 8.4 (L) 8.9 - 10.3 mg/dL   GFR calc non Af Amer >60 >60 mL/min   GFR calc Af Amer >60 >60 mL/min   Anion gap 9 5 - 15    Comment: Performed at [facility], [address] [place], [place] [address]  CBC     Status: Abnormal   Collection Time: 10/25/19  6:12 AM  Result Value Ref Range   WBC 12.5  (H) 4.0 - 10.5 K/uL   RBC 4.72 4.22 - 5.81 MIL/uL   Hemoglobin 13.7 13.0 - 17.0 g/dL   HCT 44.1 39.0 - 52.0 %   MCV 93.4 80.0 - 100.0 fL   MCH 29.0 26.0 - 34.0 pg   MCHC 31.1 30.0 - 36.0 g/dL   RDW 14.7 11.5 - 15.5 %   Platelets 196 150 - 400 K/uL   nRBC 0.0 0.0 - 0.2 %    Comment: Performed at [facility], [address] [place], [place] [address]   Personally reviewed CT- area of dilated bowel transition to decompressed bowel, reviewed with radiology and bowel is decompressed not thickened distally   DG Abdomen 1 View  Result Date: 10/24/2019 CLINICAL DATA:  Diffuse abdominal pain. Constipation. EXAM: ABDOMEN - 1 VIEW COMPARISON:  CT scan of the abdomen dated 01/04/2017 FINDINGS: There are multiple slightly distended small bowel loops in the mid abdomen. The colon and stomach are not distended. Extensive aortic atherosclerosis. Cholecystectomy. Stents in the left iliac artery. Occlusion coils in the left internal iliac artery. No significant bone abnormality. IMPRESSION: Findings consistent with  small bowel obstruction. Electronically Signed   By: Lorriane Shire M.D.   On: 10/24/2019 14:50   CT ABDOMEN PELVIS W CONTRAST  Result Date: 10/24/2019 CLINICAL DATA:  Abdominal pain. No BM for 6 days EXAM: CT ABDOMEN AND PELVIS WITH CONTRAST TECHNIQUE: Multidetector CT imaging of the abdomen and pelvis was performed using the standard protocol following bolus administration of intravenous contrast. CONTRAST:  192mL OMNIPAQUE IOHEXOL 300 MG/ML  SOLN COMPARISON:  None. FINDINGS: Lower chest: Parenchymal band within the posterior right lung base is likely postinflammatory. Scar versus subsegmental atelectasis noted within the lingula. Hepatobiliary: Several small, less than 1 cm low-density foci within left lobe of liver noted, too small to characterize. Previous cholecystectomy. No biliary dilatation. Pancreas: Unremarkable. No pancreatic ductal dilatation or surrounding inflammatory changes. Spleen:  Normal in size without focal abnormality. Adrenals/Urinary Tract: Normal appearance of the adrenal glands. Bilateral kidney cysts. The largest cyst arises from the inferior pole of left kidney measuring 4.5 cm, image 68/5. Exophytic low-density structure arising from inferior pole of the right kidney measures 9 mm and is technically too small to reliably characterize. No hydronephrosis identified bilaterally. Stomach/Bowel: The stomach is distended. The small bowel loops are dilated measuring up to 3.5 cm and there are multiple air-fluid levels. Transition to  decreased caliber distal small bowel loops noted within the right abdomen. Distal colonic diverticula noted without acute inflammation. Vascular/Lymphatic: Aortic atherosclerosis. No aneurysm. No abdominopelvic adenopathy identified. Multiple small supraumbilical ventral abdominal wall hernias contains fat only. Reproductive: Prostate is unremarkable. Other: No free fluid or fluid collections Musculoskeletal: Lumbar degenerative disc disease. L5-S1 degenerative disc disease. IMPRESSION: 1. Findings compatible with small bowel obstruction. Transition point is identified within the right abdomen. 2. Bilateral kidney cysts. Aortic Atherosclerosis (ICD10-I70.0). Electronically Signed   By: Kerby Moors M.D.   On: 10/24/2019 16:18   DG CHEST PORT 1 VIEW  Result Date: 10/24/2019 CLINICAL DATA:  Productive cough, hypoxia EXAM: PORTABLE CHEST 1 VIEW COMPARISON:  12/27/2007 FINDINGS: Heart and mediastinal contours are within normal limits. No focal opacities or effusions. No acute bony abnormality. IMPRESSION: No active disease. Electronically Signed   By: Rolm Baptise M.D.   On: 10/24/2019 18:56     Assessment & Plan:  Brett Russell is a 74 y.o. male with a resolving SBO that was admitted overnight and started to have Bms. He is otherwise feeling better and wants to have a diet. He has had issues with constipation in the past and takes a regimen for this  per his report.  Discussed that a small bowel obstruction will hopefully resolve with conservative measures but can require surgery if they continue or recur.   -Clear diet and adv as tolerated  -May not have another BM but patient says having flatus  -Will follow and likely d/c in the next 24 hrs  -Follow up with PCP   All questions were answered to the satisfaction of the patient. Discussed with Dr. Carles Collet.    Virl Cagey 10/25/2019, 8:38 AM

## 2019-10-26 DIAGNOSIS — K5900 Constipation, unspecified: Secondary | ICD-10-CM | POA: Diagnosis not present

## 2019-10-26 DIAGNOSIS — K56609 Unspecified intestinal obstruction, unspecified as to partial versus complete obstruction: Secondary | ICD-10-CM | POA: Diagnosis not present

## 2019-10-26 LAB — BASIC METABOLIC PANEL
Anion gap: 7 (ref 5–15)
BUN: 9 mg/dL (ref 8–23)
CO2: 29 mmol/L (ref 22–32)
Calcium: 8.8 mg/dL — ABNORMAL LOW (ref 8.9–10.3)
Chloride: 103 mmol/L (ref 98–111)
Creatinine, Ser: 0.68 mg/dL (ref 0.61–1.24)
GFR calc Af Amer: >60 mL/min (ref >60)
GFR calc non Af Amer: >60 mL/min (ref >60)
Glucose, Bld: 99 mg/dL (ref 70–99)
Potassium: 3.8 mmol/L (ref 3.5–5.1)
Sodium: 139 mmol/L (ref 135–145)

## 2019-10-26 LAB — CBC
HCT: 44.9 % (ref 39.0–52.0)
Hemoglobin: 14 g/dL (ref 13.0–17.0)
MCH: 29 pg (ref 26.0–34.0)
MCHC: 31.2 g/dL (ref 30.0–36.0)
MCV: 93.2 fL (ref 80.0–100.0)
Platelets: 204 10*3/uL (ref 150–400)
RBC: 4.82 MIL/uL (ref 4.22–5.81)
RDW: 14.8 % (ref 11.5–15.5)
WBC: 8.1 10*3/uL (ref 4.0–10.5)
nRBC: 0 % (ref 0.0–0.2)

## 2019-10-26 LAB — MAGNESIUM: Magnesium: 2 mg/dL (ref 1.7–2.4)

## 2019-10-26 LAB — PHOSPHORUS: Phosphorus: 2.1 mg/dL — ABNORMAL LOW (ref 2.5–4.6)

## 2019-10-26 MED ORDER — POLYETHYLENE GLYCOL 3350 17 G PO PACK
17.0000 g | PACK | Freq: Every day | ORAL | Status: DC
  Administered 2019-10-26: 17 g via ORAL
  Filled 2019-10-26: qty 1

## 2019-10-26 MED ORDER — POLYETHYLENE GLYCOL 3350 17 G PO PACK: 17.0000 g | PACK | Freq: Every day | ORAL | 0 refills | Status: DC

## 2019-10-26 MED ORDER — SENNA 8.6 MG PO TABS
2.0000 | ORAL_TABLET | Freq: Every day | ORAL | Status: DC
  Administered 2019-10-26: 17.2 mg via ORAL
  Filled 2019-10-26: qty 2

## 2019-10-26 MED ORDER — SENNA 8.6 MG PO TABS: 2.0000 | ORAL_TABLET | Freq: Every day | ORAL | 0 refills | Status: DC

## 2019-10-26 NOTE — Progress Notes (Signed)
[facility] Progress Note     Subjective: Tolerated soft and having a lot of gas. Had BMs 4/8 in the early AM. Says he is feeling better. Minor nausea after eating the whole tray this AM.   Objective: Vital signs in last 24 hours: Temp:  [98.4 F (36.9 C)-98.5 F (36.9 C)] 98.5 F (36.9 C) (04/09 0527) Pulse Rate:  [60-80] 60 (04/09 0527) Resp:  [15-16] 16 (04/09 0527) BP: (110-132)/(58-73) 132/58 (04/09 0527) SpO2:  [91 %-97 %] 91 % (04/09 0527) Last BM Date: 10/25/19  Intake/Output from previous day: 04/08 0701 - 04/09 0700 In: 1943.3 [I.V.:1943.3] Out: -  Intake/Output this shift: No intake/output data recorded.  General appearance: alert, cooperative and no distress Resp: normal work of breathing GI: soft, mildly distended, nontender  Lab Results:  Recent Labs    10/25/19 0612 10/26/19 0738  WBC 12.5* 8.1  HGB 13.7 14.0  HCT 44.1 44.9  PLT 196 204   BMET Recent Labs    10/25/19 0612 10/26/19 0738  NA 139 139  K 3.9 3.8  CL 101 103  CO2 29 29  GLUCOSE 98 99  BUN 18 9  CREATININE 0.78 0.68  CALCIUM 8.4* 8.8*   PT/INR No results for input(s): LABPROT, INR in the last 72 hours.  Studies/Results: DG Abdomen 1 View  Result Date: 10/24/2019 CLINICAL DATA:  Diffuse abdominal pain. Constipation. EXAM: ABDOMEN - 1 VIEW COMPARISON:  CT scan of the abdomen dated 01/04/2017 FINDINGS: There are multiple slightly distended small bowel loops in the mid abdomen. The colon and stomach are not distended. Extensive aortic atherosclerosis. Cholecystectomy. Stents in the left iliac artery. Occlusion coils in the left internal iliac artery. No significant bone abnormality. IMPRESSION: Findings consistent with  small bowel obstruction. Electronically Signed   By: Lorriane Shire M.D.   On: 10/24/2019 14:50   CT ABDOMEN PELVIS W CONTRAST  Result Date: 10/24/2019 CLINICAL DATA:  Abdominal pain. No BM for 6 days EXAM: CT ABDOMEN AND PELVIS WITH CONTRAST  TECHNIQUE: Multidetector CT imaging of the abdomen and pelvis was performed using the standard protocol following bolus administration of intravenous contrast. CONTRAST:  153mL OMNIPAQUE IOHEXOL 300 MG/ML  SOLN COMPARISON:  None. FINDINGS: Lower chest: Parenchymal band within the posterior right lung base is likely postinflammatory. Scar versus subsegmental atelectasis noted within the lingula. Hepatobiliary: Several small, less than 1 cm low-density foci within left lobe of liver noted, too small to characterize. Previous cholecystectomy. No biliary dilatation. Pancreas: Unremarkable. No pancreatic ductal dilatation or surrounding inflammatory changes. Spleen: Normal in size without focal abnormality. Adrenals/Urinary Tract: Normal appearance of the adrenal glands. Bilateral kidney cysts. The largest cyst arises from the inferior pole of left kidney measuring 4.5 cm, image 68/5. Exophytic low-density structure arising from inferior pole of the right kidney measures 9 mm and is technically too small to reliably characterize. No hydronephrosis identified bilaterally. Stomach/Bowel: The stomach is distended. The small bowel loops are dilated measuring up to 3.5 cm and there are multiple air-fluid levels. Transition to decreased caliber distal small bowel loops noted within the right abdomen. Distal colonic diverticula noted without acute inflammation. Vascular/Lymphatic: Aortic atherosclerosis. No aneurysm. No abdominopelvic adenopathy identified. Multiple small supraumbilical ventral abdominal wall hernias contains fat only. Reproductive: Prostate is unremarkable. Other: No free fluid or fluid collections Musculoskeletal: Lumbar degenerative disc disease. L5-S1 degenerative disc disease. IMPRESSION: 1. Findings compatible with small bowel obstruction. Transition point is identified within the right abdomen. 2. Bilateral kidney cysts. Aortic Atherosclerosis (ICD10-I70.0). Electronically  Signed   By: Kerby Moors  M.D.   On: 10/24/2019 16:18   DG CHEST PORT 1 VIEW  Result Date: 10/24/2019 CLINICAL DATA:  Productive cough, hypoxia EXAM: PORTABLE CHEST 1 VIEW COMPARISON:  12/27/2007 FINDINGS: Heart and mediastinal contours are within normal limits. No focal opacities or effusions. No acute bony abnormality. IMPRESSION: No active disease. Electronically Signed   By: Rolm Baptise M.D.   On: 10/24/2019 18:56    Anti-infectives: Anti-infectives (From admission, onward)   None      Assessment/Plan: Mr. Nidiffer is a 74 yo with a resolving SBO. Doing well and eating a diet. He is going to stay on his stool softener daily and start doing his Ex lax every other day instead of every 3 days.  -Ok to go home -Follow up with PCP -Return to hospital if worsening pain, nausea, vomiting, unable to have BM  Discussed with Dr. Carles Collet.    LOS: 0 days    Virl Cagey 10/26/2019

## 2019-10-31 DIAGNOSIS — R7303 Prediabetes: Secondary | ICD-10-CM | POA: Diagnosis not present

## 2019-10-31 DIAGNOSIS — Z125 Encounter for screening for malignant neoplasm of prostate: Secondary | ICD-10-CM | POA: Diagnosis not present

## 2019-10-31 DIAGNOSIS — I10 Essential (primary) hypertension: Secondary | ICD-10-CM | POA: Diagnosis not present

## 2019-11-06 DIAGNOSIS — R7303 Prediabetes: Secondary | ICD-10-CM | POA: Diagnosis not present

## 2019-11-06 DIAGNOSIS — K219 Gastro-esophageal reflux disease without esophagitis: Secondary | ICD-10-CM | POA: Diagnosis not present

## 2019-11-06 DIAGNOSIS — M1A00X0 Idiopathic chronic gout, unspecified site, without tophus (tophi): Secondary | ICD-10-CM | POA: Diagnosis not present

## 2019-11-06 DIAGNOSIS — K5900 Constipation, unspecified: Secondary | ICD-10-CM | POA: Diagnosis not present

## 2019-11-06 DIAGNOSIS — G20 Parkinson's disease: Secondary | ICD-10-CM | POA: Diagnosis not present

## 2019-11-06 DIAGNOSIS — E782 Mixed hyperlipidemia: Secondary | ICD-10-CM | POA: Diagnosis not present

## 2019-11-06 DIAGNOSIS — R718 Other abnormality of red blood cells: Secondary | ICD-10-CM | POA: Diagnosis not present

## 2019-11-06 DIAGNOSIS — I10 Essential (primary) hypertension: Secondary | ICD-10-CM | POA: Diagnosis not present

## 2019-11-06 DIAGNOSIS — J302 Other seasonal allergic rhinitis: Secondary | ICD-10-CM | POA: Diagnosis not present

## 2019-11-06 DIAGNOSIS — K469 Unspecified abdominal hernia without obstruction or gangrene: Secondary | ICD-10-CM | POA: Diagnosis not present

## 2019-11-06 DIAGNOSIS — Z0001 Encounter for general adult medical examination with abnormal findings: Secondary | ICD-10-CM | POA: Diagnosis not present

## 2019-11-06 DIAGNOSIS — F419 Anxiety disorder, unspecified: Secondary | ICD-10-CM | POA: Diagnosis not present

## 2019-11-06 DIAGNOSIS — R269 Unspecified abnormalities of gait and mobility: Secondary | ICD-10-CM | POA: Diagnosis not present

## 2019-11-12 ENCOUNTER — Ambulatory Visit (INDEPENDENT_AMBULATORY_CARE_PROVIDER_SITE_OTHER): Payer: Medicare Other | Admitting: Urology

## 2019-11-12 ENCOUNTER — Other Ambulatory Visit: Payer: Self-pay

## 2019-11-12 ENCOUNTER — Encounter: Payer: Self-pay | Admitting: Urology

## 2019-11-12 VITALS — BP 116/69 | HR 101 | Temp 98.6°F | Ht 68.5 in | Wt 178.0 lb

## 2019-11-12 DIAGNOSIS — L723 Sebaceous cyst: Secondary | ICD-10-CM

## 2019-11-12 NOTE — Patient Instructions (Signed)
Epidermal Cyst  An epidermal cyst is a small, painless lump under your skin. The cyst contains a grayish-white, bad-smelling substance (keratin). Do not try to pop or open an epidermal cyst yourself. What are the causes?  A blocked hair follicle.  A hair that curls and re-enters the skin instead of growing straight out of the skin.  A blocked pore.  Irritated skin.  An injury to the skin.  Certain conditions that are passed along from parent to child (inherited).  Human papillomavirus (HPV).  Long-term sun damage to the skin. What increases the risk?  Having acne.  Being overweight.  Being 30-40 years old. What are the signs or symptoms? These cysts are usually harmless, but they can get infected. Symptoms of infection may include:  Redness.  Inflammation.  Tenderness.  Warmth.  Fever.  A grayish-white, bad-smelling substance drains from the cyst.  Pus drains from the cyst. How is this treated? In many cases, epidermal cysts go away on their own without treatment. If a cyst becomes infected, treatment may include:  Opening and draining the cyst, done by a doctor. After draining, you may need minor surgery to remove the rest of the cyst.  Antibiotic medicine.  Shots of medicines (steroids) that help to reduce inflammation.  Surgery to remove the cyst. Surgery may be done if the cyst: ? Becomes large. ? Bothers you. ? Has a chance of turning into cancer.  Do not try to open a cyst yourself. Follow these instructions at home:  Take over-the-counter and prescription medicines only as told by your doctor.  If you were prescribed an antibiotic medicine, take it it as told by your doctor. Do not stop using the antibiotic even if you start to feel better.  Keep the area around your cyst clean and dry.  Wear loose, dry clothing.  Avoid touching your cyst.  Check your cyst every day for signs of infection. Check for: ? Redness, swelling, or pain. ? Fluid  or blood. ? Warmth. ? Pus or a bad smell.  Keep all follow-up visits as told by your doctor. This is important. How is this prevented?  Wear clean, dry, clothing.  Avoid wearing tight clothing.  Keep your skin clean and dry. Take showers or baths every day. Contact a doctor if:  Your cyst has symptoms of infection.  Your condition does not improve or gets worse.  You have a cyst that looks different from other cysts you have had.  You have a fever. Get help right away if:  Redness spreads from the cyst into the area close by. Summary  An epidermal cyst is a sac made of skin tissue.  If a cyst becomes infected, treatment may include surgery to open and drain the cyst, or to remove it.  Take over-the-counter and prescription medicines only as told by your doctor.  Contact a doctor if your condition is not improving or is getting worse.  Keep all follow-up visits as told by your doctor. This is important. This information is not intended to replace advice given to you by your health care provider. Make sure you discuss any questions you have with your health care provider. Document Revised: 10/26/2018 Document Reviewed: 04/13/2018 Elsevier Patient Education  2020 [employer]  

## 2019-11-12 NOTE — Progress Notes (Signed)
Urological Symptom Review  Patient is experiencing the following symptoms: none   Review of Systems  Gastrointestinal (upper)  : Nausea  Gastrointestinal (lower) : Constipation  Constitutional : Negative for symptoms  Skin: Negative for skin symptoms  Eyes: Negative for eye symptoms  Ear/Nose/Throat : Sinus problems  Hematologic/Lymphatic: Easy bruising  Cardiovascular : Negative for cardiovascular symptoms  Respiratory : Shortness of breath  Endocrine: Negative for endocrine symptoms  Musculoskeletal: Negative for musculoskeletal symptoms  Neurological: Negative for neurological symptoms  Psychologic: Negative for psychiatric symptoms

## 2019-11-12 NOTE — Progress Notes (Signed)
11/12/2019 1:35 PM   Karmen Bongo [birth year] [id number]  Referring provider: Sinda Du, MD No address on file  Scrotal swelling  HPI: Mr Baylis is a 74yo here for evaluation of a scrotal lesion, scrotal swelling. He had a abscess drained several years ago by Dr. Luan Pulling. For the past 3 years he has noted a scrotal cyst which increases in size and he intermittently drains it with a needle.    PMH: Past Medical History:  Diagnosis Date  . AAA (abdominal aortic aneurysm) ([place])   . Allergy    takes Allegra daily  . Anxiety    takes Xanax nightly  . Aortic aneurysm ([place]) 2007  . Arthritis   . COPD (chronic obstructive pulmonary disease) ([place])   . Enlarged prostate    slightly  . GERD (gastroesophageal reflux disease)    takes Omeprazole daily  . Gout    takes Allopurinol nightly  . History of bronchitis    many yrs ago  . History of colon polyps    benign  . History of kidney stones    hx of  . Hypertension    takes Amlodipine daily  . Insomnia    takes Melatonin nightly  . Macular degeneration    wet-right eye .Injection of AvAStin every 10 wks  . Parkinson disease ([place]) 2006   takes Sinemet daily  . 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 ABDOMINAL AORTIC ANEURYSM REPAIR  2007  . ABDOMINAL AORTIC ENDOVASCULAR STENT GRAFT Left 12/03/2016   Procedure: INSERTION OF LEFT COMMON  ILIAC STENT-LEFT INTERNAL ILIAC  ARTERY COILING;  Surgeon: Rosetta Posner, MD;  Location: [facility];  Service: Vascular;  Laterality: Left;  . APPENDECTOMY    . CHOLECYSTECTOMY    . COLONOSCOPY N/A 11/29/2012   Procedure: COLONOSCOPY;  Surgeon: Rogene Houston, MD;  [facility];  Service: Endoscopy;  Laterality: N/A;  1200-moved to [place] notified pt  . COLONOSCOPY N/A 09/20/2018   Procedure: COLONOSCOPY;  Surgeon: Rogene Houston, MD;  [facility];  Service: Endoscopy;   Laterality: N/A;  2:25  . KIDNEY SURGERY  2007   Tumor removed  . POLYPECTOMY  09/20/2018   Procedure: POLYPECTOMY;  Surgeon: Rogene Houston, MD;  [facility];  Service: Endoscopy;;  colon  . Right knee     Cartilage removed at age 89    Home Medications:  Allergies as of 11/12/2019      Reactions   Asa [aspirin] Other (See Comments)   Severe nosebleeds in the past.    Morphine And Related    Hallucinations/attempt to elope from the hospital   Flu Virus Vaccine    Rash       Medication List       Accurate as of November 12, 2019  1:35 PM. If you have any questions, ask your nurse or doctor.        acetaminophen 500 MG tablet Commonly known as: TYLENOL Take 500-1,000 mg by mouth every 6 (six) hours as needed (for pain).   allopurinol 300 MG tablet Commonly known as: ZYLOPRIM Take 300 mg by mouth at bedtime.   ALPRAZolam 1 MG tablet Commonly known as: XANAX Take 0.5 mg by mouth in the morning and at bedtime. *May take one tablet by mouth three times daily. May take one tablet at bedtime  amLODipine 10 MG tablet Commonly known as: NORVASC Take 10 mg by mouth at bedtime.   AVASTIN IV Inject 3.75 mg/mL into the vein See admin instructions. Every 14 weeks (3.75/.19ml   carbidopa-levodopa 25-100 MG tablet Commonly known as: SINEMET IR Take 1 tablet by mouth in the morning and at bedtime.   fexofenadine 180 MG tablet Commonly known as: ALLEGRA Take 180 mg by mouth daily as needed (for allergies.).   melatonin 1 MG Tabs tablet Take 2 mg by mouth at bedtime.   omeprazole 20 MG capsule Commonly known as: PRILOSEC Take 20 mg by mouth daily before breakfast.   ondansetron 4 MG disintegrating tablet Commonly known as: ZOFRAN-ODT Take 4 mg by mouth 4 (four) times daily as needed for nausea or vomiting.   polyethylene glycol 17 g packet Commonly known as: MIRALAX / GLYCOLAX Take 17 g by mouth daily.   senna 8.6 MG Tabs tablet Commonly known as:  SENOKOT Take 2 tablets (17.2 mg total) by mouth daily.       Allergies:  Allergies  Allergen Reactions  . Asa [Aspirin] Other (See Comments)    Severe nosebleeds in the past.   . Morphine And Related     Hallucinations/attempt to elope from the hospital  . Flu Virus Vaccine     Rash     Family History: Family History  Problem Relation Age of Onset  . Kidney disease Father   . Heart disease Father   . AAA (abdominal aortic aneurysm) Father   . Healthy Brother   . Healthy Daughter   . Constipation Daughter   . Healthy Daughter     Social History:  reports that he has been smoking cigarettes. He started smoking about 50 years ago. He has a 49.00 pack-year smoking history. He has never used smokeless tobacco. He reports that he does not drink alcohol or use drugs.  ROS: All other review of systems were reviewed and are negative except what is noted above in HPI  Physical Exam: BP 116/69   Pulse (!) 101   Temp 98.6 F (37 C)   Ht 5' 8.5" (1.74 m)   Wt 178 lb (80.7 kg)   BMI 26.67 kg/m   Constitutional:  Alert and oriented, No acute distress. HEENT: [place] AT, moist mucus membranes.  Trachea midline, no masses. Cardiovascular: No clubbing, cyanosis, or edema. Respiratory: Normal respiratory effort, no increased work of breathing. GI: Abdomen is soft, nontender, nondistended, no abdominal masses GU: No CVA tenderness. Circumcised phallus. 5 sebaceous cysts 2-100mm on left hemiscrotum and 1.5cm right scrotal sebaceous cyst  Lymph: No cervical or inguinal lymphadenopathy. Skin: No rashes, bruises or suspicious lesions. Neurologic: Grossly intact, no focal deficits, moving all 4 extremities. Psychiatric: Normal mood and affect.  Laboratory Data: Lab Results  Component Value Date   WBC 8.1 10/26/2019   HGB 14.0 10/26/2019   HCT 44.9 10/26/2019   MCV 93.2 10/26/2019   PLT 204 10/26/2019    Lab Results  Component Value Date   CREATININE 0.68 10/26/2019    No results  found for: PSA  No results found for: TESTOSTERONE  No results found for: HGBA1C  Urinalysis    Component Value Date/Time   COLORURINE YELLOW 10/24/2019 2033   APPEARANCEUR CLEAR 10/24/2019 2033   LABSPEC >1.046 (H) 10/24/2019 2033   PHURINE 5.0 10/24/2019 2033   GLUCOSEU NEGATIVE 10/24/2019 2033   HGBUR SMALL (A) 10/24/2019 2033   [place] NEGATIVE 10/24/2019 2033   [place] NEGATIVE 10/24/2019 2033  PROTEINUR 30 (A) 10/24/2019 2033   NITRITE NEGATIVE 10/24/2019 2033   LEUKOCYTESUR NEGATIVE 10/24/2019 2033    Lab Results  Component Value Date   BACTERIA NONE SEEN 10/24/2019    Pertinent Imaging:  Results for orders placed during the hospital encounter of 10/24/19  DG Abdomen 1 View   Narrative CLINICAL DATA:  Diffuse abdominal pain. Constipation.  EXAM: ABDOMEN - 1 VIEW  COMPARISON:  CT scan of the abdomen dated 01/04/2017  FINDINGS: There are multiple slightly distended small bowel loops in the mid abdomen. The colon and stomach are not distended.  Extensive aortic atherosclerosis. Cholecystectomy. Stents in the left iliac artery. Occlusion coils in the left internal iliac artery.  No significant bone abnormality.  IMPRESSION: Findings consistent with  small bowel obstruction.   Electronically Signed   By: Lorriane Shire M.D.   On: 10/24/2019 14:50    No results found for this or any previous visit. No results found for this or any previous visit. No results found for this or any previous visit. No results found for this or any previous visit. No results found for this or any previous visit. No results found for this or any previous visit. No results found for this or any previous visit.  Assessment & Plan:    1. Scrotal sebaceous cyst -We discussed the management including observation versus surgical excision. After discussing the options the patient elects for excision. Risks/benefits/alternatives discussed.    No follow-ups on  file.  Nicolette Bang, MD  [facility] [place]

## 2019-11-13 ENCOUNTER — Telehealth: Payer: Self-pay | Admitting: Urology

## 2019-11-13 NOTE — Telephone Encounter (Signed)
Pt called and asked how long between his covid shot and surgery does he need. Pt is attempting to have covid vaccine scheduled.

## 2019-11-13 NOTE — Telephone Encounter (Signed)
Pt requests a nurse return his call. 

## 2019-11-16 ENCOUNTER — Telehealth: Payer: Self-pay | Admitting: Urology

## 2019-11-16 NOTE — Telephone Encounter (Signed)
Pt left vm asking if him getting COVID shots change anything he needs to do for his surgery. Would like a call back

## 2019-11-16 NOTE — Telephone Encounter (Signed)
He can get the vaccine whenever

## 2019-11-19 DIAGNOSIS — Z23 Encounter for immunization: Secondary | ICD-10-CM | POA: Diagnosis not present

## 2019-11-19 NOTE — Telephone Encounter (Signed)
Per Dr. Alyson Ingles pt to wait 3 days between shot and surgery. Pt notified.

## 2019-11-22 DIAGNOSIS — H35031 Hypertensive retinopathy, right eye: Secondary | ICD-10-CM | POA: Diagnosis not present

## 2019-11-22 DIAGNOSIS — H353211 Exudative age-related macular degeneration, right eye, with active choroidal neovascularization: Secondary | ICD-10-CM | POA: Diagnosis not present

## 2019-11-22 DIAGNOSIS — H353121 Nonexudative age-related macular degeneration, left eye, early dry stage: Secondary | ICD-10-CM | POA: Diagnosis not present

## 2019-11-22 DIAGNOSIS — H35372 Puckering of macula, left eye: Secondary | ICD-10-CM | POA: Diagnosis not present

## 2019-12-18 DIAGNOSIS — Z23 Encounter for immunization: Secondary | ICD-10-CM | POA: Diagnosis not present

## 2020-01-01 NOTE — Patient Instructions (Signed)
Brett Russell  01/01/2020     @PREFPERIOPPHARMACY @   Your procedure is scheduled on  01/07/2020 .  Report to [facility] at  1230  P.M.  Call this number if you have problems the morning of surgery:  [phone]   Remember:  Do not eat or drink after midnight.                       Take these medicines the morning of surgery with A SIP OF WATER  Allopurinol, xanax(if needed), amlodipine, allegra, prilosec, zofran(if needed).    Do not wear jewelry, make-up or nail polish.  Do not wear lotions, powders, or perfumes. Please wear deodorant and brush your teeth.  Do not shave 48 hours prior to surgery.  Men may shave face and neck.  Do not bring valuables to the hospital.  [facility] is not responsible for any belongings or valuables.  Contacts, dentures or bridgework may not be worn into surgery.  Leave your suitcase in the car.  After surgery it may be brought to your room.  For patients admitted to the hospital, discharge time will be determined by your treatment team.  Patients discharged the day of surgery will not be allowed to drive home.   Name and phone number of your driver:   family Special instructions:  DO NOT smoke the morning of your procedure.  Please read over the following fact sheets that you were given. Anesthesia Post-op Instructions and Care and Recovery After Surgery       Epidermal Cyst Removal, Care After This sheet gives you information about how to care for yourself after your procedure. Your health care provider may also give you more specific instructions. If you have problems or questions, contact your health care provider. What can I expect after the procedure? After the procedure, it is common to have:  Soreness in the area where your cyst was removed.  Tightness or itchiness from the stitches (sutures) in your skin. Follow these instructions at home: Medicines  Take over-the-counter and prescription medicines only as told  by your health care provider.  If you were prescribed an antibiotic medicine or ointment, take or apply it as told by your health care provider. Do not stop using the antibiotic even if you start to feel better. Incision care   Follow instructions from your health care provider about how to take care of your incision. Make sure you: ? Wash your hands with soap and water before you change your bandage (dressing). If soap and water are not available, use hand sanitizer. ? Change your dressing as told by your health care provider. ? Leave sutures, skin glue, or adhesive strips in place. These skin closures may need to stay in place for 1-2 weeks or longer. If adhesive strip edges start to loosen and curl up, you may trim the loose edges. Do not remove adhesive strips completely unless your health care provider tells you to do that.  Keep the dressingdry until your health care provider says that it can be removed.  After your dressing is off, check your incision area every day for signs of infection. Check for: ? Redness, swelling, or pain. ? Fluid or blood. ? Warmth. ? Pus or a bad smell. General instructions  Do not take baths, swim, or use a hot tub until your health care provider approves. Ask your health care provider if you may take showers. You  may only be allowed to take sponge baths.  Your health care provider may ask you to avoid contact sports or activities that take a lot of effort. Do not do anything that stretches or puts pressure on your incision.  You can return to your normal diet.  Keep all follow-up visits as told by your health care provider. This is important. Contact a health care provider if:  You have a fever.  You have redness, swelling, or pain in the incision area.  You have fluid or blood coming from your incision.  You have pus or a bad smell coming from your incision.  Your incision feels warm to the touch.  Your cyst grows back. Summary  After  the procedure, it is common to have soreness in the area where your cyst was removed.  Take or apply over-the-counter and prescription medicines only as told by your health care provider.  Follow instructions from your health care provider about how to take care of your incision. This information is not intended to replace advice given to you by your health care provider. Make sure you discuss any questions you have with your health care provider. Document Revised: 10/25/2017 Document Reviewed: 04/28/2017 Elsevier Patient Education  2020 [place] Anesthesia, Adult, Care After This sheet gives you information about how to care for yourself after your procedure. Your health care provider may also give you more specific instructions. If you have problems or questions, contact your health care provider. What can I expect after the procedure? After the procedure, the following side effects are common:  Pain or discomfort at the IV site.  Nausea.  Vomiting.  Sore throat.  Trouble concentrating.  Feeling cold or chills.  Weak or tired.  Sleepiness and fatigue.  Soreness and body aches. These side effects can affect parts of the body that were not involved in surgery. Follow these instructions at home:  For at least 24 hours after the procedure:  Have a responsible adult stay with you. It is important to have someone help care for you until you are awake and alert.  Rest as needed.  Do not: ? Participate in activities in which you could fall or become injured. ? Drive. ? Use heavy machinery. ? Drink alcohol. ? Take sleeping pills or medicines that cause drowsiness. ? Make important decisions or sign legal documents. ? Take care of children on your own. Eating and drinking  Follow any instructions from your health care provider about eating or drinking restrictions.  When you feel hungry, start by eating small amounts of foods that are soft and easy to digest  (bland), such as toast. Gradually return to your regular diet.  Drink enough fluid to keep your urine pale yellow.  If you vomit, rehydrate by drinking water, juice, or clear broth. General instructions  If you have sleep apnea, surgery and certain medicines can increase your risk for breathing problems. Follow instructions from your health care provider about wearing your sleep device: ? Anytime you are sleeping, including during daytime naps. ? While taking prescription pain medicines, sleeping medicines, or medicines that make you drowsy.  Return to your normal activities as told by your health care provider. Ask your health care provider what activities are safe for you.  Take over-the-counter and prescription medicines only as told by your health care provider.  If you smoke, do not smoke without supervision.  Keep all follow-up visits as told by your health care provider. This is important. Contact  a health care provider if:  You have nausea or vomiting that does not get better with medicine.  You cannot eat or drink without vomiting.  You have pain that does not get better with medicine.  You are unable to pass urine.  You develop a skin rash.  You have a fever.  You have redness around your IV site that gets worse. Get help right away if:  You have difficulty breathing.  You have chest pain.  You have blood in your urine or stool, or you vomit blood. Summary  After the procedure, it is common to have a sore throat or nausea. It is also common to feel tired.  Have a responsible adult stay with you for the first 24 hours after general anesthesia. It is important to have someone help care for you until you are awake and alert.  When you feel hungry, start by eating small amounts of foods that are soft and easy to digest (bland), such as toast. Gradually return to your regular diet.  Drink enough fluid to keep your urine pale yellow.  Return to your normal  activities as told by your health care provider. Ask your health care provider what activities are safe for you. This information is not intended to replace advice given to you by your health care provider. Make sure you discuss any questions you have with your health care provider. Document Revised: 07/08/2017 Document Reviewed: 02/18/2017 Elsevier Patient Education  [place]. How to Use Chlorhexidine for Bathing Chlorhexidine gluconate (CHG) is a germ-killing (antiseptic) solution that is used to clean the skin. It can get rid of the bacteria that normally live on the skin and can keep them away for about 24 hours. To clean your skin with CHG, you may be given:  A CHG solution to use in the shower or as part of a sponge bath.  A prepackaged cloth that contains CHG. Cleaning your skin with CHG may help lower the risk for infection:  While you are staying in the intensive care unit of the hospital.  If you have a vascular access, such as a central line, to provide short-term or long-term access to your veins.  If you have a catheter to drain urine from your bladder.  If you are on a ventilator. A ventilator is a machine that helps you breathe by moving air in and out of your lungs.  After surgery. What are the risks? Risks of using CHG include:  A skin reaction.  Hearing loss, if CHG gets in your ears.  Eye injury, if CHG gets in your eyes and is not rinsed out.  The CHG product catching fire. Make sure that you avoid smoking and flames after applying CHG to your skin. Do not use CHG:  If you have a chlorhexidine allergy or have previously reacted to chlorhexidine.  On babies younger than 39 months of age. How to use CHG solution  Use CHG only as told by your health care provider, and follow the instructions on the label.  Use the full amount of CHG as directed. Usually, this is one bottle. During a shower Follow these steps when using CHG solution during a shower  (unless your health care provider gives you different instructions): 1. Start the shower. 2. Use your normal soap and shampoo to wash your face and hair. 3. Turn off the shower or move out of the shower stream. 4. Pour the CHG onto a clean washcloth. Do not use any type  of brush or rough-edged sponge. 5. Starting at your neck, lather your body down to your toes. Make sure you follow these instructions: ? If you will be having surgery, pay special attention to the part of your body where you will be having surgery. Scrub this area for at least 1 minute. ? Do not use CHG on your head or face. If the solution gets into your ears or eyes, rinse them well with water. ? Avoid your genital area. ? Avoid any areas of skin that have broken skin, cuts, or scrapes. ? Scrub your back and under your arms. Make sure to wash skin folds. 6. Let the lather sit on your skin for 1-2 minutes or as long as told by your health care provider. 7. Thoroughly rinse your entire body in the shower. Make sure that all body creases and crevices are rinsed well. 8. Dry off with a clean towel. Do not put any substances on your body afterward--such as powder, lotion, or perfume--unless you are told to do so by your health care provider. Only use lotions that are recommended by the manufacturer. 9. Put on clean clothes or pajamas. 10. If it is the night before your surgery, sleep in clean sheets.  During a sponge bath Follow these steps when using CHG solution during a sponge bath (unless your health care provider gives you different instructions): 1. Use your normal soap and shampoo to wash your face and hair. 2. Pour the CHG onto a clean washcloth. 3. Starting at your neck, lather your body down to your toes. Make sure you follow these instructions: ? If you will be having surgery, pay special attention to the part of your body where you will be having surgery. Scrub this area for at least 1 minute. ? Do not use CHG on your  head or face. If the solution gets into your ears or eyes, rinse them well with water. ? Avoid your genital area. ? Avoid any areas of skin that have broken skin, cuts, or scrapes. ? Scrub your back and under your arms. Make sure to wash skin folds. 4. Let the lather sit on your skin for 1-2 minutes or as long as told by your health care provider. 5. Using a different clean, wet washcloth, thoroughly rinse your entire body. Make sure that all body creases and crevices are rinsed well. 6. Dry off with a clean towel. Do not put any substances on your body afterward--such as powder, lotion, or perfume--unless you are told to do so by your health care provider. Only use lotions that are recommended by the manufacturer. 7. Put on clean clothes or pajamas. 8. If it is the night before your surgery, sleep in clean sheets. How to use CHG prepackaged cloths  Only use CHG cloths as told by your health care provider, and follow the instructions on the label.  Use the CHG cloth on clean, dry skin.  Do not use the CHG cloth on your head or face unless your health care provider tells you to.  When washing with the CHG cloth: ? Avoid your genital area. ? Avoid any areas of skin that have broken skin, cuts, or scrapes. Before surgery Follow these steps when using a CHG cloth to clean before surgery (unless your health care provider gives you different instructions): 1. Using the CHG cloth, vigorously scrub the part of your body where you will be having surgery. Scrub using a back-and-forth motion for 3 minutes. The area on your body  should be completely wet with CHG when you are done scrubbing. 2. Do not rinse. Discard the cloth and let the area air-dry. Do not put any substances on the area afterward, such as powder, lotion, or perfume. 3. Put on clean clothes or pajamas. 4. If it is the night before your surgery, sleep in clean sheets.  For general bathing Follow these steps when using CHG cloths for  general bathing (unless your health care provider gives you different instructions). 1. Use a separate CHG cloth for each area of your body. Make sure you wash between any folds of skin and between your fingers and toes. Wash your body in the following order, switching to a new cloth after each step: ? The front of your neck, shoulders, and chest. ? Both of your arms, under your arms, and your hands. ? Your stomach and groin area, avoiding the genitals. ? Your right leg and foot. ? Your left leg and foot. ? The back of your neck, your back, and your buttocks. 2. Do not rinse. Discard the cloth and let the area air-dry. Do not put any substances on your body afterward--such as powder, lotion, or perfume--unless you are told to do so by your health care provider. Only use lotions that are recommended by the manufacturer. 3. Put on clean clothes or pajamas. Contact a health care provider if:  Your skin gets irritated after scrubbing.  You have questions about using your solution or cloth. Get help right away if:  Your eyes become very red or swollen.  Your eyes itch badly.  Your skin itches badly and is red or swollen.  Your hearing changes.  You have trouble seeing.  You have swelling or tingling in your mouth or throat.  You have trouble breathing.  You swallow any chlorhexidine. Summary  Chlorhexidine gluconate (CHG) is a germ-killing (antiseptic) solution that is used to clean the skin. Cleaning your skin with CHG may help to lower your risk for infection.  You may be given CHG to use for bathing. It may be in a bottle or in a prepackaged cloth to use on your skin. Carefully follow your health care provider's instructions and the instructions on the product label.  Do not use CHG if you have a chlorhexidine allergy.  Contact your health care provider if your skin gets irritated after scrubbing. This information is not intended to replace advice given to you by your health  care provider. Make sure you discuss any questions you have with your health care provider. Document Revised: 09/21/2018 Document Reviewed: 06/02/2017 Elsevier Patient Education  [place].

## 2020-01-03 ENCOUNTER — Other Ambulatory Visit: Payer: Self-pay

## 2020-01-03 ENCOUNTER — Encounter (HOSPITAL_COMMUNITY): Payer: Self-pay

## 2020-01-04 ENCOUNTER — Other Ambulatory Visit (HOSPITAL_COMMUNITY)
Admission: RE | Admit: 2020-01-04 | Discharge: 2020-01-04 | Disposition: A | Discharge status: Home / Self Care | Payer: Medicare Other | Source: Ambulatory Visit | Attending: Urology | Admitting: Urology

## 2020-01-04 ENCOUNTER — Other Ambulatory Visit: Payer: Self-pay

## 2020-01-04 ENCOUNTER — Encounter (HOSPITAL_COMMUNITY)
Admission: RE | Admit: 2020-01-04 | Discharge: 2020-01-04 | Disposition: A | Discharge status: Home / Self Care | Payer: Medicare Other | Source: Ambulatory Visit | Attending: Urology | Admitting: Urology

## 2020-01-04 DIAGNOSIS — Z20822 Contact with and (suspected) exposure to covid-19: Secondary | ICD-10-CM | POA: Insufficient documentation

## 2020-01-04 DIAGNOSIS — Z01812 Encounter for preprocedural laboratory examination: Secondary | ICD-10-CM | POA: Diagnosis not present

## 2020-01-04 LAB — SARS CORONAVIRUS 2 (TAT 6-24 HRS): SARS Coronavirus 2: NEGATIVE

## 2020-01-07 ENCOUNTER — Ambulatory Visit (HOSPITAL_COMMUNITY)
Admission: RE | Admit: 2020-01-07 | Discharge: 2020-01-07 | Disposition: A | Discharge status: Home / Self Care | Payer: Medicare Other | Attending: Urology | Admitting: Urology

## 2020-01-07 ENCOUNTER — Ambulatory Visit (HOSPITAL_COMMUNITY): Payer: Medicare Other | Admitting: Anesthesiology

## 2020-01-07 ENCOUNTER — Other Ambulatory Visit: Payer: Self-pay

## 2020-01-07 ENCOUNTER — Encounter (HOSPITAL_COMMUNITY)
Admission: RE | Disposition: A | Discharge status: Home / Self Care | Payer: Self-pay | Source: Home / Self Care | Attending: Urology

## 2020-01-07 DIAGNOSIS — M1990 Unspecified osteoarthritis, unspecified site: Secondary | ICD-10-CM | POA: Diagnosis not present

## 2020-01-07 DIAGNOSIS — F17210 Nicotine dependence, cigarettes, uncomplicated: Secondary | ICD-10-CM | POA: Diagnosis not present

## 2020-01-07 DIAGNOSIS — F419 Anxiety disorder, unspecified: Secondary | ICD-10-CM | POA: Insufficient documentation

## 2020-01-07 DIAGNOSIS — L720 Epidermal cyst: Secondary | ICD-10-CM | POA: Diagnosis not present

## 2020-01-07 DIAGNOSIS — I10 Essential (primary) hypertension: Secondary | ICD-10-CM | POA: Diagnosis not present

## 2020-01-07 DIAGNOSIS — K219 Gastro-esophageal reflux disease without esophagitis: Secondary | ICD-10-CM | POA: Insufficient documentation

## 2020-01-07 DIAGNOSIS — L723 Sebaceous cyst: Secondary | ICD-10-CM | POA: Diagnosis not present

## 2020-01-07 DIAGNOSIS — Z8612 Personal history of poliomyelitis: Secondary | ICD-10-CM | POA: Insufficient documentation

## 2020-01-07 DIAGNOSIS — J449 Chronic obstructive pulmonary disease, unspecified: Secondary | ICD-10-CM | POA: Insufficient documentation

## 2020-01-07 DIAGNOSIS — M109 Gout, unspecified: Secondary | ICD-10-CM | POA: Diagnosis not present

## 2020-01-07 DIAGNOSIS — G20 Parkinson's disease: Secondary | ICD-10-CM | POA: Diagnosis not present

## 2020-01-07 HISTORY — PX: CYST EXCISION: SHX5701

## 2020-01-07 SURGERY — CYST REMOVAL
Anesthesia: General | Site: Scrotum

## 2020-01-07 MED ORDER — PROPOFOL 10 MG/ML IV BOLUS
INTRAVENOUS | Status: DC | PRN
  Administered 2020-01-07: 200 mg via INTRAVENOUS

## 2020-01-07 MED ORDER — SODIUM CHLORIDE 0.9 % IR SOLN
Status: DC | PRN
  Administered 2020-01-07: 1000 mL

## 2020-01-07 MED ORDER — LACTATED RINGERS IV SOLN: Freq: Once | INTRAVENOUS | Status: AC

## 2020-01-07 MED ORDER — IPRATROPIUM-ALBUTEROL 0.5-2.5 (3) MG/3ML IN SOLN
3.0000 mL | Freq: Once | RESPIRATORY_TRACT | Status: AC
  Administered 2020-01-07: 3 mL via RESPIRATORY_TRACT

## 2020-01-07 MED ORDER — CHLORHEXIDINE GLUCONATE 0.12 % MT SOLN
OROMUCOSAL | Status: AC
  Filled 2020-01-07: qty 15

## 2020-01-07 MED ORDER — BUPIVACAINE HCL 0.25 % IJ SOLN
INTRAMUSCULAR | Status: DC | PRN
  Administered 2020-01-07: 10 mL

## 2020-01-07 MED ORDER — PHENYLEPHRINE 40 MCG/ML (10ML) SYRINGE FOR IV PUSH (FOR BLOOD PRESSURE SUPPORT)
PREFILLED_SYRINGE | INTRAVENOUS | Status: AC
  Filled 2020-01-07: qty 10

## 2020-01-07 MED ORDER — CEFAZOLIN SODIUM-DEXTROSE 2-4 GM/100ML-% IV SOLN
2.0000 g | INTRAVENOUS | Status: AC
  Administered 2020-01-07: 2 g via INTRAVENOUS

## 2020-01-07 MED ORDER — PROPOFOL 10 MG/ML IV BOLUS
INTRAVENOUS | Status: AC
  Filled 2020-01-07: qty 20

## 2020-01-07 MED ORDER — ONDANSETRON HCL 4 MG/2ML IJ SOLN
INTRAMUSCULAR | Status: AC
  Filled 2020-01-07: qty 2

## 2020-01-07 MED ORDER — FENTANYL CITRATE (PF) 100 MCG/2ML IJ SOLN: 25.0000 ug | INTRAMUSCULAR | Status: DC | PRN

## 2020-01-07 MED ORDER — ONDANSETRON HCL 4 MG/2ML IJ SOLN
INTRAMUSCULAR | Status: DC | PRN
  Administered 2020-01-07: 4 mg via INTRAVENOUS

## 2020-01-07 MED ORDER — PHENYLEPHRINE HCL (PRESSORS) 10 MG/ML IV SOLN
INTRAVENOUS | Status: DC | PRN
  Administered 2020-01-07: 100 ug via INTRAVENOUS

## 2020-01-07 MED ORDER — ONDANSETRON HCL 4 MG/2ML IJ SOLN: 4.0000 mg | Freq: Once | INTRAMUSCULAR | Status: DC | PRN

## 2020-01-07 MED ORDER — GLYCOPYRROLATE PF 0.2 MG/ML IJ SOSY
PREFILLED_SYRINGE | INTRAMUSCULAR | Status: AC
  Filled 2020-01-07: qty 1

## 2020-01-07 MED ORDER — ORAL CARE MOUTH RINSE: 15.0000 mL | Freq: Once | OROMUCOSAL | Status: AC

## 2020-01-07 MED ORDER — BACITRACIN 500 UNIT/GM EX OINT
TOPICAL_OINTMENT | CUTANEOUS | Status: DC | PRN
  Administered 2020-01-07: 1 via TOPICAL

## 2020-01-07 MED ORDER — OXYCODONE-ACETAMINOPHEN 5-325 MG PO TABS: 1.0000 | ORAL_TABLET | ORAL | 0 refills | Status: DC | PRN

## 2020-01-07 MED ORDER — EPHEDRINE 5 MG/ML INJ
INTRAVENOUS | Status: AC
  Filled 2020-01-07: qty 10

## 2020-01-07 MED ORDER — LIDOCAINE HCL (CARDIAC) PF 50 MG/5ML IV SOSY
PREFILLED_SYRINGE | INTRAVENOUS | Status: DC | PRN
  Administered 2020-01-07: 100 mg via INTRAVENOUS

## 2020-01-07 MED ORDER — CEFAZOLIN SODIUM-DEXTROSE 2-4 GM/100ML-% IV SOLN
INTRAVENOUS | Status: AC
  Filled 2020-01-07: qty 100

## 2020-01-07 MED ORDER — MEPERIDINE HCL 50 MG/ML IJ SOLN: 6.2500 mg | INTRAMUSCULAR | Status: DC | PRN

## 2020-01-07 MED ORDER — BUPIVACAINE HCL (PF) 0.25 % IJ SOLN
INTRAMUSCULAR | Status: AC
  Filled 2020-01-07: qty 30

## 2020-01-07 MED ORDER — LACTATED RINGERS IV SOLN
INTRAVENOUS | Status: DC | PRN
Start: 2020-01-07 — End: 2020-01-07

## 2020-01-07 MED ORDER — MIDAZOLAM HCL 2 MG/2ML IJ SOLN
INTRAMUSCULAR | Status: AC
  Filled 2020-01-07: qty 2

## 2020-01-07 MED ORDER — MIDAZOLAM HCL 2 MG/2ML IJ SOLN
INTRAMUSCULAR | Status: DC | PRN
  Administered 2020-01-07: 2 mg via INTRAVENOUS

## 2020-01-07 MED ORDER — IPRATROPIUM-ALBUTEROL 0.5-2.5 (3) MG/3ML IN SOLN
RESPIRATORY_TRACT | Status: AC
  Filled 2020-01-07: qty 3

## 2020-01-07 MED ORDER — EPHEDRINE SULFATE 50 MG/ML IJ SOLN
INTRAMUSCULAR | Status: DC | PRN
  Administered 2020-01-07: 10 mg via INTRAVENOUS

## 2020-01-07 MED ORDER — BACITRACIN ZINC 500 UNIT/GM EX OINT
TOPICAL_OINTMENT | CUTANEOUS | Status: AC
  Filled 2020-01-07: qty 0.9

## 2020-01-07 MED ORDER — FENTANYL CITRATE (PF) 100 MCG/2ML IJ SOLN
INTRAMUSCULAR | Status: DC | PRN
  Administered 2020-01-07: 100 ug via INTRAVENOUS

## 2020-01-07 MED ORDER — LIDOCAINE 2% (20 MG/ML) 5 ML SYRINGE
INTRAMUSCULAR | Status: AC
  Filled 2020-01-07: qty 5

## 2020-01-07 MED ORDER — FENTANYL CITRATE (PF) 100 MCG/2ML IJ SOLN
INTRAMUSCULAR | Status: AC
  Filled 2020-01-07: qty 2

## 2020-01-07 MED ORDER — IPRATROPIUM-ALBUTEROL 0.5-2.5 (3) MG/3ML IN SOLN: 3.0000 mL | Freq: Once | RESPIRATORY_TRACT | Status: DC

## 2020-01-07 MED ORDER — GLYCOPYRROLATE 0.2 MG/ML IJ SOLN
INTRAMUSCULAR | Status: DC | PRN
  Administered 2020-01-07: .2 mg via INTRAVENOUS

## 2020-01-07 MED ORDER — CHLORHEXIDINE GLUCONATE 0.12 % MT SOLN
15.0000 mL | Freq: Once | OROMUCOSAL | Status: AC
  Administered 2020-01-07: 15 mL via OROMUCOSAL

## 2020-01-07 SURGICAL SUPPLY — 24 items
COVER LIGHT HANDLE STERIS (MISCELLANEOUS) ×6 IMPLANT
COVER WAND RF STERILE (DRAPES) ×3 IMPLANT
DECANTER SPIKE VIAL GLASS SM (MISCELLANEOUS) ×3 IMPLANT
ELECT NEEDLE TIP 2.8 STRL (NEEDLE) ×3 IMPLANT
ELECT REM PT RETURN 9FT ADLT (ELECTROSURGICAL) ×3
ELECTRODE REM PT RTRN 9FT ADLT (ELECTROSURGICAL) ×1 IMPLANT
GAUZE SPONGE 4X4 12PLY STRL (GAUZE/BANDAGES/DRESSINGS) ×6 IMPLANT
GLOVE BIO SURGEON STRL SZ8 (GLOVE) ×3 IMPLANT
GLOVE BIOGEL PI IND STRL 7.0 (GLOVE) ×2 IMPLANT
GLOVE BIOGEL PI INDICATOR 7.0 (GLOVE) ×4
GOWN STRL REUS W/TWL LRG LVL3 (GOWN DISPOSABLE) ×3 IMPLANT
GOWN STRL REUS W/TWL XL LVL3 (GOWN DISPOSABLE) ×3 IMPLANT
KIT TURNOVER KIT A (KITS) ×3 IMPLANT
MANIFOLD NEPTUNE II (INSTRUMENTS) ×3 IMPLANT
NEEDLE HYPO 25X1 1.5 SAFETY (NEEDLE) ×3 IMPLANT
PACK MINOR (CUSTOM PROCEDURE TRAY) ×3 IMPLANT
SET BASIN LINEN APH (SET/KITS/TRAYS/PACK) ×3 IMPLANT
SPONGE GAUZE 4X4 12PLY STER LF (GAUZE/BANDAGES/DRESSINGS) ×3 IMPLANT
SUPPORT SCROTAL LG STRP (MISCELLANEOUS) ×2 IMPLANT
SUPPORTER ATHLETIC LG (MISCELLANEOUS) ×1
SUT CHROMIC 3 0 PS 2 (SUTURE) IMPLANT
SUT VIC AB 3-0 SH 27 (SUTURE)
SUT VIC AB 3-0 SH 27X BRD (SUTURE) IMPLANT
SYR CONTROL 10ML LL (SYRINGE) ×3 IMPLANT

## 2020-01-07 NOTE — H&P (Signed)
Scrotal swelling  HPI: Brett Russell is a 74yo here for evaluation of a scrotal lesion, scrotal swelling. He had a abscess drained several years ago by Dr. Luan Pulling. For the past 3 years he has noted a scrotal cyst which increases in size and he intermittently drains it with a needle.    PMH:     Past Medical History:  Diagnosis Date  . AAA (abdominal aortic aneurysm) ([place])   . Allergy    takes Allegra daily  . Anxiety    takes Xanax nightly  . Aortic aneurysm ([place]) 2007  . Arthritis   . COPD (chronic obstructive pulmonary disease) ([place])   . Enlarged prostate    slightly  . GERD (gastroesophageal reflux disease)    takes Omeprazole daily  . Gout    takes Allopurinol nightly  . History of bronchitis    many yrs ago  . History of colon polyps    benign  . History of kidney stones    hx of  . Hypertension    takes Amlodipine daily  . Insomnia    takes Melatonin nightly  . Macular degeneration    wet-right eye .Injection of AvAStin every 10 wks  . Parkinson disease ([place]) 2006   takes Sinemet daily  . 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 ABDOMINAL AORTIC ANEURYSM REPAIR  2007  . ABDOMINAL AORTIC ENDOVASCULAR STENT GRAFT Left 12/03/2016   Procedure: INSERTION OF LEFT COMMON  ILIAC STENT-LEFT INTERNAL ILIAC  ARTERY COILING;  Surgeon: Rosetta Posner, MD;  Location: [facility];  Service: Vascular;  Laterality: Left;  . APPENDECTOMY    . CHOLECYSTECTOMY    . COLONOSCOPY N/A 11/29/2012   Procedure: COLONOSCOPY;  Surgeon: Rogene Houston, MD;  [facility];  Service: Endoscopy;  Laterality: N/A;  1200-moved to [place] notified pt  . COLONOSCOPY N/A 09/20/2018   Procedure: COLONOSCOPY;  Surgeon: Rogene Houston, MD;  [facility];  Service: Endoscopy;  Laterality: N/A;  2:25  . KIDNEY SURGERY  2007   Tumor removed  .  POLYPECTOMY  09/20/2018   Procedure: POLYPECTOMY;  Surgeon: Rogene Houston, MD;  [facility];  Service: Endoscopy;;  colon  . Right knee     Cartilage removed at age 36    Home Medications:       Allergies as of 11/12/2019      Reactions   Asa [aspirin] Other (See Comments)   Severe nosebleeds in the past.    Morphine And Related    Hallucinations/attempt to elope from the hospital   Flu Virus Vaccine    Rash          Medication List       Accurate as of November 12, 2019  1:35 PM. If you have any questions, ask your nurse or doctor.        acetaminophen 500 MG tablet Commonly known as: TYLENOL Take 500-1,000 mg by mouth every 6 (six) hours as needed (for pain).   allopurinol 300 MG tablet Commonly known as: ZYLOPRIM Take 300 mg by mouth at bedtime.   ALPRAZolam 1 MG tablet Commonly known as: XANAX Take 0.5 mg by mouth in the morning and at bedtime. *May take one tablet by mouth three times daily. May take one tablet at bedtime   amLODipine 10 MG tablet  Commonly known as: NORVASC Take 10 mg by mouth at bedtime.   AVASTIN IV Inject 3.75 mg/mL into the vein See admin instructions. Every 14 weeks (3.75/.70ml   carbidopa-levodopa 25-100 MG tablet Commonly known as: SINEMET IR Take 1 tablet by mouth in the morning and at bedtime.   fexofenadine 180 MG tablet Commonly known as: ALLEGRA Take 180 mg by mouth daily as needed (for allergies.).   melatonin 1 MG Tabs tablet Take 2 mg by mouth at bedtime.   omeprazole 20 MG capsule Commonly known as: PRILOSEC Take 20 mg by mouth daily before breakfast.   ondansetron 4 MG disintegrating tablet Commonly known as: ZOFRAN-ODT Take 4 mg by mouth 4 (four) times daily as needed for nausea or vomiting.   polyethylene glycol 17 g packet Commonly known as: MIRALAX / GLYCOLAX Take 17 g by mouth daily.   senna 8.6 MG Tabs tablet Commonly known as: SENOKOT Take 2 tablets (17.2 mg  total) by mouth daily.       Allergies:       Allergies  Allergen Reactions  . Asa [Aspirin] Other (See Comments)    Severe nosebleeds in the past.   . Morphine And Related     Hallucinations/attempt to elope from the hospital  . Flu Virus Vaccine     Rash     Family History:      Family History  Problem Relation Age of Onset  . Kidney disease Father   . Heart disease Father   . AAA (abdominal aortic aneurysm) Father   . Healthy Brother   . Healthy Daughter   . Constipation Daughter   . Healthy Daughter     Social History:  reports that he has been smoking cigarettes. He started smoking about 50 years ago. He has a 49.00 pack-year smoking history. He has never used smokeless tobacco. He reports that he does not drink alcohol or use drugs.  ROS: All other review of systems were reviewed and are negative except what is noted above in HPI  Physical Exam: BP 116/69   Pulse (!) 101   Temp 98.6 F (37 C)   Ht 5' 8.5" (1.74 m)   Wt 178 lb (80.7 kg)   BMI 26.67 kg/m   Constitutional:  Alert and oriented, No acute distress. HEENT: [place] AT, moist mucus membranes.  Trachea midline, no masses. Cardiovascular: No clubbing, cyanosis, or edema. Respiratory: Normal respiratory effort, no increased work of breathing. GI: Abdomen is soft, nontender, nondistended, no abdominal masses GU: No CVA tenderness. Circumcised phallus. 5 sebaceous cysts 2-46mm on left hemiscrotum and 1.5cm right scrotal sebaceous cyst  Lymph: No cervical or inguinal lymphadenopathy. Skin: No rashes, bruises or suspicious lesions. Neurologic: Grossly intact, no focal deficits, moving all 4 extremities. Psychiatric: Normal mood and affect.  Laboratory Data: Recent Labs       Lab Results  Component Value Date   WBC 8.1 10/26/2019   HGB 14.0 10/26/2019   HCT 44.9 10/26/2019   MCV 93.2 10/26/2019   PLT 204 10/26/2019      Recent Labs       Lab Results  Component  Value Date   CREATININE 0.68 10/26/2019      Recent Labs  No results found for: PSA    Recent Labs  No results found for: TESTOSTERONE    Recent Labs  No results found for: HGBA1C    Urinalysis Labs (Brief)          Component Value Date/Time   COLORURINE  YELLOW 10/24/2019 2033   APPEARANCEUR CLEAR 10/24/2019 2033   LABSPEC >1.046 (H) 10/24/2019 2033   PHURINE 5.0 10/24/2019 2033   GLUCOSEU NEGATIVE 10/24/2019 2033   HGBUR SMALL (A) 10/24/2019 2033   BILIRUBINUR NEGATIVE 10/24/2019 2033   [place] NEGATIVE 10/24/2019 2033   PROTEINUR 30 (A) 10/24/2019 2033   NITRITE NEGATIVE 10/24/2019 2033   LEUKOCYTESUR NEGATIVE 10/24/2019 2033      Recent Labs       Lab Results  Component Value Date   BACTERIA NONE SEEN 10/24/2019      Pertinent Imaging:  Results for orders placed during the hospital encounter of 10/24/19  DG Abdomen 1 View   Narrative CLINICAL DATA:  Diffuse abdominal pain. Constipation.  EXAM: ABDOMEN - 1 VIEW  COMPARISON:  CT scan of the abdomen dated 01/04/2017  FINDINGS: There are multiple slightly distended small bowel loops in the mid abdomen. The colon and stomach are not distended.  Extensive aortic atherosclerosis. Cholecystectomy. Stents in the left iliac artery. Occlusion coils in the left internal iliac artery.  No significant bone abnormality.  IMPRESSION: Findings consistent with  small bowel obstruction.   Electronically Signed   By: Lorriane Shire M.D.   On: 10/24/2019 14:50    No results found for this or any previous visit. No results found for this or any previous visit. No results found for this or any previous visit. No results found for this or any previous visit. No results found for this or any previous visit. No results found for this or any previous visit. No results found for this or any previous visit.  Assessment & Plan:    1. Scrotal sebaceous cyst -We discussed  the management including observation versus surgical excision. After discussing the options the patient elects for excision. Risks/benefits/alternatives discussed.

## 2020-01-07 NOTE — Op Note (Signed)
Preoperative diagnosis: scrotal sebaceous cysts  Postoperative diagnosis: Same  Procedure: 1. Excision of scrotal sebaceous cysts  Attending: Nicolette Bang, MD  Anesthesia: General  History of blood loss: Minimal  Antibiotics: ancef  Drains: none  Specimens: 1. Scrotal sebaceous cysts and surrounding scrotal skin   Findings: 5 sebaceous cysts. 1.5cm right upper scrotum, 1cm right lower scortum, 1.5cm left mid scrotum and 1cm and 0.5cm left lower scrotum  Indications: Patient is a 74 year old male with a history of scrotal sebaceous cysts that were growing in size and intermittently swelling.  We discussed the treatment options including observation versus excision after discussing treatment options he proceed with excision.   Procedure in detail: Prior to procedure consent was obtained.  Patient was brought to the operating room and a brief timeout was done to ensure correct patient, correct procedure, correct site.  General anesthesia was administered and patient was placed in supine position.  His genitalia was then prepped and draped in usual sterile fashion. Circumferential incisions were made around each sebaceous cyst. We then used electocautery to remove the dartos tissue under each cyst. We sen the cysts, surrounding scrotal skin and underlying dartos for pathology. The incisions were closed with 3-0 vicryl in a running fashion A dressing was then applied to the incision.  We then placed a scrotal fluff and this then concluded the procedure which was well tolerated by the patient.  Complications: None  Condition: Stable, extubated, transferred to PACU.  Plan: Patient is to be discharged home.  He is to follow up in 2 weeks for wound check.

## 2020-01-07 NOTE — Transfer of Care (Signed)
Immediate Anesthesia Transfer of Care Note  Patient: Brett Russell  Procedure(s) Performed: excision of scrotal sebaceous cyst (N/A )  Patient Location: PACU  Anesthesia Type:General  Level of Consciousness: awake, alert , oriented and patient cooperative  Airway & Oxygen Therapy: Patient Spontanous Breathing  Post-op Assessment: Report given to RN, Post -op Vital signs reviewed and stable and Patient moving all extremities  Post vital signs: Reviewed and stable  Last Vitals:  Vitals Value Taken Time  BP 121/76 01/07/20 1345  Temp    Pulse 96 01/07/20 1346  Resp 19 01/07/20 1346  SpO2 93 % 01/07/20 1346  Vitals shown include unvalidated device data.  Last Pain:  Vitals:   01/07/20 1248  TempSrc: Oral  PainSc: 0-No pain      Patients Stated Pain Goal: 6 (26/20/35 5974)  Complications: No complications documented.

## 2020-01-07 NOTE — Anesthesia Procedure Notes (Signed)
Procedure Name: LMA Insertion Performed by: Mosetta Ferdinand L, CRNA Pre-anesthesia Checklist: Patient identified, Emergency Drugs available, Suction available, Patient being monitored and Timeout performed Patient Re-evaluated:Patient Re-evaluated prior to induction Oxygen Delivery Method: Circle system utilized Preoxygenation: Pre-oxygenation with 100% oxygen Induction Type: IV induction LMA: LMA inserted LMA Size: 4.0 Number of attempts: 1 Placement Confirmation: positive ETCO2,  CO2 detector and breath sounds checked- equal and bilateral Tube secured with: Tape Dental Injury: Teeth and Oropharynx as per pre-operative assessment        

## 2020-01-07 NOTE — Discharge Instructions (Signed)
Epidermal Cyst Removal, Care After This sheet gives you information about how to care for yourself after your procedure. Your health care provider may also give you more specific instructions. If you have problems or questions, contact your health care provider. What can I expect after the procedure? After the procedure, it is common to have:  Soreness in the area where your cyst was removed.  Tightness or itchiness from the stitches (sutures) in your skin. Follow these instructions at home: Medicines  Take over-the-counter and prescription medicines only as told by your health care provider.  If you were prescribed an antibiotic medicine or ointment, take or apply it as told by your health care provider. Do not stop using the antibiotic even if you start to feel better. Incision care   Follow instructions from your health care provider about how to take care of your incision. Make sure you: ? Wash your hands with soap and water before you change your bandage (dressing). If soap and water are not available, use hand sanitizer. ? Change your dressing as told by your health care provider. ? Leave sutures, skin glue, or adhesive strips in place. These skin closures may need to stay in place for 1-2 weeks or longer. If adhesive strip edges start to loosen and curl up, you may trim the loose edges. Do not remove adhesive strips completely unless your health care provider tells you to do that.  Keep the dressingdry until your health care provider says that it can be removed.  After your dressing is off, check your incision area every day for signs of infection. Check for: ? Redness, swelling, or pain. ? Fluid or blood. ? Warmth. ? Pus or a bad smell. General instructions  Do not take baths, swim, or use a hot tub until your health care provider approves. Ask your health care provider if you may take showers. You may only be allowed to take sponge baths.  Your health care provider may ask  you to avoid contact sports or activities that take a lot of effort. Do not do anything that stretches or puts pressure on your incision.  You can return to your normal diet.  Keep all follow-up visits as told by your health care provider. This is important. Contact a health care provider if:  You have a fever.  You have redness, swelling, or pain in the incision area.  You have fluid or blood coming from your incision.  You have pus or a bad smell coming from your incision.  Your incision feels warm to the touch.  Your cyst grows back. Summary  After the procedure, it is common to have soreness in the area where your cyst was removed.  Take or apply over-the-counter and prescription medicines only as told by your health care provider.  Follow instructions from your health care provider about how to take care of your incision. This information is not intended to replace advice given to you by your health care provider. Make sure you discuss any questions you have with your health care provider. Document Revised: 10/25/2017 Document Reviewed: 04/28/2017 Elsevier Patient Education  2020 [place] Anesthesia, Adult, Care After This sheet gives you information about how to care for yourself after your procedure. Your health care provider may also give you more specific instructions. If you have problems or questions, contact your health care provider. What can I expect after the procedure? After the procedure, the following side effects are common:  Pain or discomfort at the IV site.  Nausea.  Vomiting.  Sore throat.  Trouble concentrating.  Feeling cold or chills.  Weak or tired.  Sleepiness and fatigue.  Soreness and body aches. These side effects can affect parts of the body that were not involved in surgery. Follow these instructions at home:  For at least 24 hours after the procedure:  Have a responsible adult stay with you. It is important  to have someone help care for you until you are awake and alert.  Rest as needed.  Do not: ? Participate in activities in which you could fall or become injured. ? Drive. ? Use heavy machinery. ? Drink alcohol. ? Take sleeping pills or medicines that cause drowsiness. ? Make important decisions or sign legal documents. ? Take care of children on your own. Eating and drinking  Follow any instructions from your health care provider about eating or drinking restrictions.  When you feel hungry, start by eating small amounts of foods that are soft and easy to digest (bland), such as toast. Gradually return to your regular diet.  Drink enough fluid to keep your urine pale yellow.  If you vomit, rehydrate by drinking water, juice, or clear broth. General instructions  If you have sleep apnea, surgery and certain medicines can increase your risk for breathing problems. Follow instructions from your health care provider about wearing your sleep device: ? Anytime you are sleeping, including during daytime naps. ? While taking prescription pain medicines, sleeping medicines, or medicines that make you drowsy.  Return to your normal activities as told by your health care provider. Ask your health care provider what activities are safe for you.  Take over-the-counter and prescription medicines only as told by your health care provider.  If you smoke, do not smoke without supervision.  Keep all follow-up visits as told by your health care provider. This is important. Contact a health care provider if:  You have nausea or vomiting that does not get better with medicine.  You cannot eat or drink without vomiting.  You have pain that does not get better with medicine.  You are unable to pass urine.  You develop a skin rash.  You have a fever.  You have redness around your IV site that gets worse. Get help right away if:  You have difficulty breathing.  You have chest pain.  You  have blood in your urine or stool, or you vomit blood. Summary  After the procedure, it is common to have a sore throat or nausea. It is also common to feel tired.  Have a responsible adult stay with you for the first 24 hours after general anesthesia. It is important to have someone help care for you until you are awake and alert.  When you feel hungry, start by eating small amounts of foods that are soft and easy to digest (bland), such as toast. Gradually return to your regular diet.  Drink enough fluid to keep your urine pale yellow.  Return to your normal activities as told by your health care provider. Ask your health care provider what activities are safe for you. This information is not intended to replace advice given to you by your health care provider. Make sure you discuss any questions you have with your health care provider. Document Revised: 07/08/2017 Document Reviewed: 02/18/2017 Elsevier Patient Education  [place].

## 2020-01-07 NOTE — Anesthesia Preprocedure Evaluation (Addendum)
Anesthesia Evaluation  Patient identified by MRN, date of birth, ID band Patient awake    Reviewed: Allergy & Precautions, NPO status , Patient's Chart, lab work & pertinent test results  History of Anesthesia Complications Negative for: history of anesthetic complications  Airway Mallampati: II  TM Distance: >3 FB Neck ROM: Full    Dental  (+) Chipped, Dental Advisory Given, Missing,    Pulmonary COPD, Current Smoker and Patient abstained from smoking.,    Pulmonary exam normal breath sounds clear to auscultation       Cardiovascular Exercise Tolerance: Good hypertension, Pt. on medications + Peripheral Vascular Disease (AAA)  Normal cardiovascular exam Rhythm:Regular Rate:Normal  24-Oct-2019 15:37:33 [facility]-NLD ROUTINE RECORD Sinus tachycardia Nonspecific T abnrm, anterolateral leads   Neuro/Psych Anxiety  Neuromuscular disease (PARKINSON'S Disease)    GI/Hepatic Neg liver ROS, GERD  Medicated and Controlled,  Endo/Other  negative endocrine ROS  Renal/GU negative Renal ROS     Musculoskeletal  (+) Arthritis  (gout),   Abdominal   Peds  Hematology negative hematology ROS (+)   Anesthesia Other Findings   Reproductive/Obstetrics negative OB ROS                           Anesthesia Physical Anesthesia Plan  ASA: III  Anesthesia Plan: General   Post-op Pain Management:    Induction: Intravenous  PONV Risk Score and Plan: 3 and Midazolam and Ondansetron  Airway Management Planned: LMA  Additional Equipment:   Intra-op Plan:   Post-operative Plan: Extubation in OR  Informed Consent: I have reviewed the patients History and Physical, chart, labs and discussed the procedure including the risks, benefits and alternatives for the proposed anesthesia with the patient or authorized representative who has indicated his/her understanding and acceptance.     Dental  advisory given  Plan Discussed with: CRNA and Surgeon  Anesthesia Plan Comments:        Anesthesia Quick Evaluation

## 2020-01-07 NOTE — Anesthesia Postprocedure Evaluation (Signed)
Anesthesia Post Note  Patient: Brett Russell  Procedure(s) Performed: excision of scrotal sebaceous cyst (N/A )  Anesthesia Type: General Level of consciousness: awake, oriented, awake and alert and patient cooperative Pain management: pain level controlled Vital Signs Assessment: post-procedure vital signs reviewed and stable Respiratory status: spontaneous breathing, nonlabored ventilation and respiratory function stable Cardiovascular status: blood pressure returned to baseline and stable Postop Assessment: no headache and no backache Anesthetic complications: no   No complications documented.   Last Vitals:  Vitals:   01/07/20 1248  BP: (!) 143/78  Resp: 18  Temp: 36.7 C  SpO2: 93%    Last Pain:  Vitals:   01/07/20 1248  TempSrc: Oral  PainSc: 0-No pain                 Tacy Learn

## 2020-01-08 ENCOUNTER — Encounter (HOSPITAL_COMMUNITY): Payer: Self-pay | Admitting: Urology

## 2020-01-09 ENCOUNTER — Telehealth: Payer: Self-pay | Admitting: Urology

## 2020-01-09 LAB — SURGICAL PATHOLOGY

## 2020-01-09 NOTE — Telephone Encounter (Signed)
Pt called and voiced concerned over getting his results from his recent surgery and not be able to comprehend what they mean. He would like a call back to discuss what he is seeing in his [facility].

## 2020-01-10 NOTE — Telephone Encounter (Signed)
Called pt. Busy tone

## 2020-01-10 NOTE — Telephone Encounter (Signed)
Pt asking about post op care for incision. Instructed pt to keep are clean and dry and not to submerge in water ( like a bath) . Instructed to take warm wet rag and gently wipe around area and to let dry. pt reports no drainage and has not had to take any pain medication. Pt was calling to give update of post op wound.

## 2020-01-23 ENCOUNTER — Encounter: Payer: Self-pay | Admitting: Urology

## 2020-01-23 ENCOUNTER — Other Ambulatory Visit: Payer: Self-pay

## 2020-01-23 ENCOUNTER — Ambulatory Visit (INDEPENDENT_AMBULATORY_CARE_PROVIDER_SITE_OTHER): Payer: Medicare Other | Admitting: Urology

## 2020-01-23 VITALS — BP 130/72 | HR 102 | Temp 98.4°F | Ht 68.0 in | Wt 176.0 lb

## 2020-01-23 DIAGNOSIS — L723 Sebaceous cyst: Secondary | ICD-10-CM

## 2020-01-23 DIAGNOSIS — N2889 Other specified disorders of kidney and ureter: Secondary | ICD-10-CM

## 2020-01-23 MED ORDER — BACITRACIN 500 UNIT/GM EX OINT
1.0000 | TOPICAL_OINTMENT | Freq: Two times a day (BID) | CUTANEOUS | 0 refills | Status: DC
Start: 2020-01-23 — End: 2021-10-02

## 2020-01-23 NOTE — Progress Notes (Signed)
01/23/2020 12:19 PM   Brett Russell 26-May-1946 [id number]  Referring provider: Celene Squibb, MD [address] Brett Russell,  [place] [address]  followup sebaceous cyst excision  HPI: Brett Russell is a 74yo here for followup after sebaceous cyst excision 01/07/2020. No scortal pain. No drainage from incision   PMH: Past Medical History:  Diagnosis Date  . AAA (abdominal aortic aneurysm) ([place])   . Allergy    takes Allegra daily  . Anxiety    takes Xanax nightly  . Aortic aneurysm ([place]) 2007  . Arthritis   . COPD (chronic obstructive pulmonary disease) ([place])   . Enlarged prostate    slightly  . GERD (gastroesophageal reflux disease)    takes Omeprazole daily  . Gout    takes Allopurinol nightly  . History of bronchitis    many yrs ago  . History of colon polyps    benign  . History of kidney stones    hx of  . Hypertension    takes Amlodipine daily  . Insomnia    takes Melatonin nightly  . Macular degeneration    wet-right eye .Injection of AvAStin every 10 wks  . Parkinson disease ([employer]) 2006   takes Sinemet daily  . 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 ABDOMINAL AORTIC ANEURYSM REPAIR  2007  . ABDOMINAL AORTIC ENDOVASCULAR STENT GRAFT Left 12/03/2016   Procedure: INSERTION OF LEFT COMMON  ILIAC STENT-LEFT INTERNAL ILIAC  ARTERY COILING;  Surgeon: Rosetta Posner, MD;  Location: [facility];  Service: Vascular;  Laterality: Left;  . APPENDECTOMY    . CHOLECYSTECTOMY    . COLONOSCOPY N/A 11/29/2012   Procedure: COLONOSCOPY;  Surgeon: Rogene Houston, MD;  [facility];  Service: Endoscopy;  Laterality: N/A;  1200-moved to [place] notified pt  . COLONOSCOPY N/A 09/20/2018   Procedure: COLONOSCOPY;  Surgeon: Rogene Houston, MD;  [facility];  Service: Endoscopy;  Laterality: N/A;  2:25  . CYST EXCISION N/A 01/07/2020   Procedure: excision of scrotal  sebaceous cyst;  Surgeon: Cleon Gustin, MD;  [facility];  Service: Urology;  Laterality: N/A;  . KIDNEY SURGERY  2007   Tumor removed  . POLYPECTOMY  09/20/2018   Procedure: POLYPECTOMY;  Surgeon: Rogene Houston, MD;  [facility];  Service: Endoscopy;;  colon  . Right knee     Cartilage removed at age 56    Home Medications:  Allergies as of 01/23/2020      Reactions   Asa [aspirin] Other (See Comments)   Severe nosebleeds in the past.    Morphine And Related    Hallucinations/attempt to elope from the hospital   Flu Virus Vaccine    Rash       Medication List       Accurate as of January 23, 2020 12:19 PM. If you have any questions, ask your nurse or doctor.        acetaminophen 500 MG tablet Commonly known as: TYLENOL Take 500-1,000 mg by mouth every 6 (six) hours as needed for moderate pain.   allopurinol 300 MG tablet Commonly known as: ZYLOPRIM Take 300 mg by mouth at bedtime.   ALPRAZolam 1 MG tablet Commonly known as: XANAX Take 0.5 mg by mouth 2 (two) times daily.   amLODipine 10 MG tablet Commonly known as:  NORVASC Take 10 mg by mouth at bedtime.   carbidopa-levodopa 25-100 MG tablet Commonly known as: SINEMET IR Take 1 tablet by mouth in the morning and at bedtime.   clotrimazole-betamethasone cream Commonly known as: LOTRISONE Apply 1 application topically 2 (two) times daily as needed (jock itch).   fexofenadine 180 MG tablet Commonly known as: ALLEGRA Take 180 mg by mouth daily as needed for allergies.   melatonin 5 MG Tabs Take 5 mg by mouth at bedtime as needed (sleep).   omeprazole 20 MG capsule Commonly known as: PRILOSEC Take 20 mg by mouth daily before breakfast.   ondansetron 4 MG disintegrating tablet Commonly known as: ZOFRAN-ODT Take 4 mg by mouth 4 (four) times daily as needed for nausea or vomiting.   oxyCODONE-acetaminophen 5-325 MG tablet Commonly known as: Percocet Take 1 tablet by mouth every 4 (four)  hours as needed for severe pain.       Allergies:  Allergies  Allergen Reactions  . Asa [Aspirin] Other (See Comments)    Severe nosebleeds in the past.   . Morphine And Related     Hallucinations/attempt to elope from the hospital  . Flu Virus Vaccine     Rash     Family History: Family History  Problem Relation Age of Onset  . Kidney disease Father   . Heart disease Father   . AAA (abdominal aortic aneurysm) Father   . Healthy Brother   . Healthy Daughter   . Constipation Daughter   . Healthy Daughter     Social History:  reports that he has been smoking cigarettes. He started smoking about 50 years ago. He has a 49.00 pack-year smoking history. He has never used smokeless tobacco. He reports that he does not drink alcohol and does not use drugs.  ROS: All other review of systems were reviewed and are negative except what is noted above in HPI  Physical Exam: BP 130/72   Pulse (!) 102   Temp 98.4 F (36.9 C)   Ht 5\' 8"  (1.727 m)   Wt 176 lb (79.8 kg)   BMI 26.76 kg/m   Constitutional:  Alert and oriented, No acute distress. HEENT: [place] AT, moist mucus membranes.  Trachea midline, no masses. Cardiovascular: No clubbing, cyanosis, or edema. Respiratory: Normal respiratory effort, no increased work of breathing. GI: Abdomen is soft, nontender, nondistended, no abdominal masses GU: No CVA tenderness. Circumcised phallus. No masses/lesions on penis, testis, scrotum. Healing scrotal incisions Lymph: No cervical or inguinal lymphadenopathy. Skin: No rashes, bruises or suspicious lesions. Neurologic: Grossly intact, no focal deficits, moving all 4 extremities. Psychiatric: Normal mood and affect.  Laboratory Data: Lab Results  Component Value Date   WBC 8.1 10/26/2019   HGB 14.0 10/26/2019   HCT 44.9 10/26/2019   MCV 93.2 10/26/2019   PLT 204 10/26/2019    Lab Results  Component Value Date   CREATININE 0.68 10/26/2019    No results found for: PSA  No  results found for: TESTOSTERONE  No results found for: HGBA1C  Urinalysis    Component Value Date/Time   COLORURINE YELLOW 10/24/2019 2033   APPEARANCEUR CLEAR 10/24/2019 2033   LABSPEC >1.046 (H) 10/24/2019 2033   PHURINE 5.0 10/24/2019 2033   GLUCOSEU NEGATIVE 10/24/2019 2033   HGBUR SMALL (A) 10/24/2019 2033   [place] NEGATIVE 10/24/2019 2033   [place] NEGATIVE 10/24/2019 2033   PROTEINUR 30 (A) 10/24/2019 2033   NITRITE NEGATIVE 10/24/2019 2033   LEUKOCYTESUR NEGATIVE 10/24/2019 2033  Lab Results  Component Value Date   BACTERIA NONE SEEN 10/24/2019    Pertinent Imaging:  Results for orders placed during the hospital encounter of 10/24/19  DG Abdomen 1 View  Narrative CLINICAL DATA:  Diffuse abdominal pain. Constipation.  EXAM: ABDOMEN - 1 VIEW  COMPARISON:  CT scan of the abdomen dated 01/04/2017  FINDINGS: There are multiple slightly distended small bowel loops in the mid abdomen. The colon and stomach are not distended.  Extensive aortic atherosclerosis. Cholecystectomy. Stents in the left iliac artery. Occlusion coils in the left internal iliac artery.  No significant bone abnormality.  IMPRESSION: Findings consistent with  small bowel obstruction.   Electronically Signed By: Lorriane Shire M.D. On: 10/24/2019 14:50  No results found for this or any previous visit.  No results found for this or any previous visit.  No results found for this or any previous visit.  No results found for this or any previous visit.  No results found for this or any previous visit.  No results found for this or any previous visit.  No results found for this or any previous visit.   Assessment & Plan:    1. Scrotal sebaceous cyst -resolved  RTC 6 months for renal US for left renal mass   Return in about 6 months (around 07/25/2020) for renal US.  Nicolette Bang, MD  [facility] [place]

## 2020-01-23 NOTE — Progress Notes (Signed)
Urological Symptom Review  Patient is experiencing the following symptoms: None   Review of Systems  Gastrointestinal (upper)  : Negative for upper GI symptoms  Gastrointestinal (lower) : Constipation  Constitutional : Negative for symptoms  Skin: Negative for skin symptoms  Eyes: Negative for eye symptoms  Ear/Nose/Throat : Sinus problems  Hematologic/Lymphatic: Negative for Hematologic/Lymphatic symptoms  Cardiovascular : Negative for cardiovascular symptoms  Respiratory : Negative for respiratory symptoms  Endocrine: Negative for endocrine symptoms  Musculoskeletal: Negative for musculoskeletal symptoms  Neurological: Negative for neurological symptoms  Psychologic: Anxiety

## 2020-01-23 NOTE — Patient Instructions (Signed)
Epidermal Cyst  An epidermal cyst is a small, painless lump under your skin. The cyst contains a grayish-white, bad-smelling substance (keratin). Do not try to pop or open an epidermal cyst yourself. What are the causes?  A blocked hair follicle.  A hair that curls and re-enters the skin instead of growing straight out of the skin.  A blocked pore.  Irritated skin.  An injury to the skin.  Certain conditions that are passed along from parent to child (inherited).  Human papillomavirus (HPV).  Long-term sun damage to the skin. What increases the risk?  Having acne.  Being overweight.  Being 30-40 years old. What are the signs or symptoms? These cysts are usually harmless, but they can get infected. Symptoms of infection may include:  Redness.  Inflammation.  Tenderness.  Warmth.  Fever.  A grayish-white, bad-smelling substance drains from the cyst.  Pus drains from the cyst. How is this treated? In many cases, epidermal cysts go away on their own without treatment. If a cyst becomes infected, treatment may include:  Opening and draining the cyst, done by a doctor. After draining, you may need minor surgery to remove the rest of the cyst.  Antibiotic medicine.  Shots of medicines (steroids) that help to reduce inflammation.  Surgery to remove the cyst. Surgery may be done if the cyst: ? Becomes large. ? Bothers you. ? Has a chance of turning into cancer.  Do not try to open a cyst yourself. Follow these instructions at home:  Take over-the-counter and prescription medicines only as told by your doctor.  If you were prescribed an antibiotic medicine, take it it as told by your doctor. Do not stop using the antibiotic even if you start to feel better.  Keep the area around your cyst clean and dry.  Wear loose, dry clothing.  Avoid touching your cyst.  Check your cyst every day for signs of infection. Check for: ? Redness, swelling, or pain. ? Fluid  or blood. ? Warmth. ? Pus or a bad smell.  Keep all follow-up visits as told by your doctor. This is important. How is this prevented?  Wear clean, dry, clothing.  Avoid wearing tight clothing.  Keep your skin clean and dry. Take showers or baths every day. Contact a doctor if:  Your cyst has symptoms of infection.  Your condition does not improve or gets worse.  You have a cyst that looks different from other cysts you have had.  You have a fever. Get help right away if:  Redness spreads from the cyst into the area close by. Summary  An epidermal cyst is a sac made of skin tissue.  If a cyst becomes infected, treatment may include surgery to open and drain the cyst, or to remove it.  Take over-the-counter and prescription medicines only as told by your doctor.  Contact a doctor if your condition is not improving or is getting worse.  Keep all follow-up visits as told by your doctor. This is important. This information is not intended to replace advice given to you by your health care provider. Make sure you discuss any questions you have with your health care provider. Document Revised: 10/26/2018 Document Reviewed: 04/13/2018 Elsevier Patient Education  2020 [employer]  

## 2020-02-20 ENCOUNTER — Other Ambulatory Visit: Payer: Self-pay

## 2020-02-20 DIAGNOSIS — I723 Aneurysm of iliac artery: Secondary | ICD-10-CM

## 2020-02-28 DIAGNOSIS — H353211 Exudative age-related macular degeneration, right eye, with active choroidal neovascularization: Secondary | ICD-10-CM | POA: Diagnosis not present

## 2020-02-28 DIAGNOSIS — H43813 Vitreous degeneration, bilateral: Secondary | ICD-10-CM | POA: Diagnosis not present

## 2020-02-28 DIAGNOSIS — H35372 Puckering of macula, left eye: Secondary | ICD-10-CM | POA: Diagnosis not present

## 2020-02-28 DIAGNOSIS — H353121 Nonexudative age-related macular degeneration, left eye, early dry stage: Secondary | ICD-10-CM | POA: Diagnosis not present

## 2020-03-05 ENCOUNTER — Ambulatory Visit (INDEPENDENT_AMBULATORY_CARE_PROVIDER_SITE_OTHER): Payer: Medicare Other | Admitting: Physician Assistant

## 2020-03-05 ENCOUNTER — Other Ambulatory Visit: Payer: Self-pay

## 2020-03-05 ENCOUNTER — Ambulatory Visit (HOSPITAL_COMMUNITY)
Admission: RE | Admit: 2020-03-05 | Discharge: 2020-03-05 | Disposition: A | Discharge status: Home / Self Care | Payer: Medicare Other | Source: Ambulatory Visit | Attending: Surgery | Admitting: Surgery

## 2020-03-05 VITALS — BP 135/75 | HR 86 | Temp 98.0°F | Resp 20 | Ht 68.0 in | Wt 171.3 lb

## 2020-03-05 DIAGNOSIS — Z95828 Presence of other vascular implants and grafts: Secondary | ICD-10-CM

## 2020-03-05 DIAGNOSIS — I10 Essential (primary) hypertension: Secondary | ICD-10-CM

## 2020-03-05 DIAGNOSIS — J449 Chronic obstructive pulmonary disease, unspecified: Secondary | ICD-10-CM

## 2020-03-05 DIAGNOSIS — K219 Gastro-esophageal reflux disease without esophagitis: Secondary | ICD-10-CM | POA: Diagnosis not present

## 2020-03-05 DIAGNOSIS — I723 Aneurysm of iliac artery: Secondary | ICD-10-CM | POA: Diagnosis not present

## 2020-03-05 DIAGNOSIS — Z8679 Personal history of other diseases of the circulatory system: Secondary | ICD-10-CM | POA: Diagnosis not present

## 2020-03-05 DIAGNOSIS — Z9889 Other specified postprocedural states: Secondary | ICD-10-CM | POA: Diagnosis not present

## 2020-03-05 NOTE — Progress Notes (Signed)
Office Note     CC:  follow up Requesting Provider:  Celene Squibb, MD  HPI: Brett Russell is a 74 y.o. (11-Apr-1946) male who presents for routine follow-up status post abdominal aortic aneurysm repair and left iliac stent graft.  He continues to do heavy yard work on a daily basis denies claudication or rest pain.  Denies episodic back or abdominal pain.  He states he is on ongoing chronic issues with constipation which is not changed.  He is allergic to aspirin.  He is not on a statin. No tobacco use. No history of diabetes mellitus.   Past Medical History:  Diagnosis Date  . AAA (abdominal aortic aneurysm) ([place])   . Allergy    takes Allegra daily  . Anxiety    takes Xanax nightly  . Aortic aneurysm ([place]) 2007  . Arthritis   . COPD (chronic obstructive pulmonary disease) ([place])   . Enlarged prostate    slightly  . GERD (gastroesophageal reflux disease)    takes Omeprazole daily  . Gout    takes Allopurinol nightly  . History of bronchitis    many yrs ago  . History of colon polyps    benign  . History of kidney stones    hx of  . Hypertension    takes Amlodipine daily  . Insomnia    takes Melatonin nightly  . Macular degeneration    wet-right eye .Injection of AvAStin every 10 wks  . Parkinson disease ([place]) 2006   takes Sinemet daily  . 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 ABDOMINAL AORTIC ANEURYSM REPAIR  2007  . ABDOMINAL AORTIC ENDOVASCULAR STENT GRAFT Left 12/03/2016   Procedure: INSERTION OF LEFT COMMON  ILIAC STENT-LEFT INTERNAL ILIAC  ARTERY COILING;  Surgeon: Rosetta Posner, MD;  Location: [facility];  Service: Vascular;  Laterality: Left;  . APPENDECTOMY    . CHOLECYSTECTOMY    . COLONOSCOPY N/A 11/29/2012   Procedure: COLONOSCOPY;  Surgeon: Rogene Houston, MD;  [facility];  Service: Endoscopy;  Laterality: N/A;  1200-moved to [place] notified pt  . COLONOSCOPY N/A  09/20/2018   Procedure: COLONOSCOPY;  Surgeon: Rogene Houston, MD;  [facility];  Service: Endoscopy;  Laterality: N/A;  2:25  . CYST EXCISION N/A 01/07/2020   Procedure: excision of scrotal sebaceous cyst;  Surgeon: Cleon Gustin, MD;  [facility];  Service: Urology;  Laterality: N/A;  . KIDNEY SURGERY  2007   Tumor removed  . POLYPECTOMY  09/20/2018   Procedure: POLYPECTOMY;  Surgeon: Rogene Houston, MD;  [facility];  Service: Endoscopy;;  colon  . Right knee     Cartilage removed at age 67    Social History   Socioeconomic History  . Marital status: Divorced    Spouse name: Not on file  . Number of children: Not on file  . Years of education: Not on file  . Highest education level: Not on file  Occupational History  . Not on file  Tobacco Use  . Smoking status: Current Every Day Smoker    Packs/day: 1.00    Years: 49.00    Pack years: 49.00    Types: Cigarettes    Start date: 08/11/1969  . Smokeless tobacco: Never Used  Vaping Use  . Vaping Use: Never used  Substance and Sexual Activity  .  Alcohol use: No    Alcohol/week: 0.0 standard drinks  . Drug use: No  . Sexual activity: Not on file  Other Topics Concern  . Not on file  Social History Narrative  . Not on file   Social Determinants of Health   Financial Resource Strain:   . Difficulty of Paying Living Expenses:   Food Insecurity:   . Worried About Charity fundraiser in the Last Year:   . Arboriculturist in the Last Year:   Transportation Needs:   . Film/video editor (Medical):   Marland Kitchen Lack of Transportation (Non-Medical):   Physical Activity:   . Days of Exercise per Week:   . Minutes of Exercise per Session:   Stress:   . Feeling of Stress :   Social Connections:   . Frequency of Communication with Friends and Family:   . Frequency of Social Gatherings with Friends and Family:   . Attends Religious Services:   . Active Member of Clubs or Organizations:   .  Attends Archivist Meetings:   Marland Kitchen Marital Status:   Intimate Partner Violence:   . Fear of Current or Ex-Partner:   . Emotionally Abused:   Marland Kitchen Physically Abused:   . Sexually Abused:    Family History  Problem Relation Age of Onset  . Kidney disease Father   . Heart disease Father   . AAA (abdominal aortic aneurysm) Father   . Healthy Brother   . Healthy Daughter   . Constipation Daughter   . Healthy Daughter     Current Outpatient Medications  Medication Sig Dispense Refill  . acetaminophen (TYLENOL) 500 MG tablet Take 500-1,000 mg by mouth every 6 (six) hours as needed for moderate pain.     Marland Kitchen allopurinol (ZYLOPRIM) 300 MG tablet Take 300 mg by mouth at bedtime.     . ALPRAZolam (XANAX) 1 MG tablet Take 0.5 mg by mouth 2 (two) times daily.     Marland Kitchen amLODipine (NORVASC) 10 MG tablet Take 10 mg by mouth at bedtime.     . bacitracin 500 UNIT/GM ointment Apply 1 application topically 2 (two) times daily. 15 g 0  . carbidopa-levodopa (SINEMET IR) 25-100 MG per tablet Take 1 tablet by mouth in the morning and at bedtime.     . clotrimazole-betamethasone (LOTRISONE) cream Apply 1 application topically 2 (two) times daily as needed (jock itch).    . fexofenadine (ALLEGRA) 180 MG tablet Take 180 mg by mouth daily as needed for allergies.     . melatonin 5 MG TABS Take 5 mg by mouth at bedtime as needed (sleep).     Marland Kitchen omeprazole (PRILOSEC) 20 MG capsule Take 20 mg by mouth daily before breakfast.     . ondansetron (ZOFRAN-ODT) 4 MG disintegrating tablet Take 4 mg by mouth 4 (four) times daily as needed for nausea or vomiting.     No current facility-administered medications for this visit.    Allergies  Allergen Reactions  . Asa [Aspirin] Other (See Comments)    Severe nosebleeds in the past.   . Morphine And Related     Hallucinations/attempt to elope from the hospital  . Flu Virus Vaccine     Rash      REVIEW OF SYSTEMS:   [X]  denotes positive finding, [ ]  denotes  negative finding Cardiac  Comments:  Chest pain or chest pressure:    Shortness of breath upon exertion:    Short of breath when lying flat:  Irregular heart rhythm:        Vascular    Pain in calf, thigh, or hip brought on by ambulation:    Pain in feet at night that wakes you up from your sleep:     Blood clot in your veins:    Leg swelling:         Pulmonary    Oxygen at home:    Productive cough:     Wheezing:         Neurologic    Sudden weakness in arms or legs:     Sudden numbness in arms or legs:     Sudden onset of difficulty speaking or slurred speech:    Temporary loss of vision in one eye:     Problems with dizziness:         Gastrointestinal    Blood in stool:     Vomited blood:         Genitourinary    Burning when urinating:     Blood in urine:        Psychiatric    Major depression:         Hematologic    Bleeding problems:    Problems with blood clotting too easily:        Skin    Rashes or ulcers:        Constitutional    Fever or chills:      PHYSICAL EXAMINATION:  Vitals:   03/05/20 0951  BP: 135/75  Pulse: 86  Resp: 20  Temp: 98 F (36.7 C)  TempSrc: Temporal  SpO2: 94%  Weight: 171 lb 4.8 oz (77.7 kg)  Height: 5\' 8"  (1.727 m)    General:  WDWN in NAD; vital signs documented above Gait: Unaided, no ataxia HENT: WNL, normocephalic Pulmonary: normal non-labored breathing , without Rales, rhonchi,  wheezing Cardiac: regular HR, without  Murmurs without carotid bruit Abdomen: soft, NT, no masses.  Approximately 2 to 3 cm midline fascial defect without hernia. Skin: without rashes Vascular Exam/Pulses: The patient has 2+ bilateral femoral pulses, 1+ right popliteal pulse, 2+ right dorsalis pedis and posterior tibial pulses.  On the left, the patient has a 2+ femoral pulse, weakly palpable left popliteal pulse, 2+ posterior tibial pulse and 1+ dorsalis pedis pulse.  Upper extremity pulses: Brachial and radial pulses are 2+  bilaterally. Extremities: without ischemic changes, without Gangrene , without cellulitis; without open wounds;  Musculoskeletal: no muscle wasting or atrophy  Neurologic: A&O X 3;  No focal weakness or paresthesias are detected Psychiatric:  The pt has Normal affect.   Non-Invasive Vascular Imaging:   03/05/2020 Patent aorto-iliac bypass graft and patent left CIA stent graft. Biphasic waveforms. Maximal abdominal aortic dimension: 2.82 cm in the transverse plane.  Line right common iliac artery 1.8 cm Left common iliac artery 1.6 cm  ASSESSMENT/PLAN:: 74 y.o. male here for follow up for abdominal aortic aneurysm repair 2007 and status post left common iliac artery stent graft in 2018.  He is asymptomatic.  Patent aortoiliac bypass graft and patent left common iliac artery stent graft.  Encourage the patient to continue his activity level and to notify us for onset of lower extremity pain with exercise, rest pain or nonhealing wounds.  Recheck in 1 year with aortoiliac duplex and ABIs.  Barbie Banner, PA-C [facility] [phone]  Clinic MD:   [place]

## 2020-05-15 DIAGNOSIS — I10 Essential (primary) hypertension: Secondary | ICD-10-CM | POA: Diagnosis not present

## 2020-05-15 DIAGNOSIS — R7301 Impaired fasting glucose: Secondary | ICD-10-CM | POA: Diagnosis not present

## 2020-05-15 DIAGNOSIS — Z0001 Encounter for general adult medical examination with abnormal findings: Secondary | ICD-10-CM | POA: Diagnosis not present

## 2020-05-21 DIAGNOSIS — E782 Mixed hyperlipidemia: Secondary | ICD-10-CM | POA: Diagnosis not present

## 2020-05-21 DIAGNOSIS — J302 Other seasonal allergic rhinitis: Secondary | ICD-10-CM | POA: Diagnosis not present

## 2020-05-21 DIAGNOSIS — F419 Anxiety disorder, unspecified: Secondary | ICD-10-CM | POA: Diagnosis not present

## 2020-05-21 DIAGNOSIS — G20 Parkinson's disease: Secondary | ICD-10-CM | POA: Diagnosis not present

## 2020-05-21 DIAGNOSIS — R269 Unspecified abnormalities of gait and mobility: Secondary | ICD-10-CM | POA: Diagnosis not present

## 2020-05-21 DIAGNOSIS — I10 Essential (primary) hypertension: Secondary | ICD-10-CM | POA: Diagnosis not present

## 2020-05-21 DIAGNOSIS — K469 Unspecified abdominal hernia without obstruction or gangrene: Secondary | ICD-10-CM | POA: Diagnosis not present

## 2020-05-21 DIAGNOSIS — K5900 Constipation, unspecified: Secondary | ICD-10-CM | POA: Diagnosis not present

## 2020-05-21 DIAGNOSIS — M1A00X0 Idiopathic chronic gout, unspecified site, without tophus (tophi): Secondary | ICD-10-CM | POA: Diagnosis not present

## 2020-05-21 DIAGNOSIS — K219 Gastro-esophageal reflux disease without esophagitis: Secondary | ICD-10-CM | POA: Diagnosis not present

## 2020-05-21 DIAGNOSIS — R718 Other abnormality of red blood cells: Secondary | ICD-10-CM | POA: Diagnosis not present

## 2020-05-21 DIAGNOSIS — R7303 Prediabetes: Secondary | ICD-10-CM | POA: Diagnosis not present

## 2020-06-05 DIAGNOSIS — H353211 Exudative age-related macular degeneration, right eye, with active choroidal neovascularization: Secondary | ICD-10-CM | POA: Diagnosis not present

## 2020-06-05 DIAGNOSIS — H353122 Nonexudative age-related macular degeneration, left eye, intermediate dry stage: Secondary | ICD-10-CM | POA: Diagnosis not present

## 2020-06-05 DIAGNOSIS — H43813 Vitreous degeneration, bilateral: Secondary | ICD-10-CM | POA: Diagnosis not present

## 2020-06-05 DIAGNOSIS — H35372 Puckering of macula, left eye: Secondary | ICD-10-CM | POA: Diagnosis not present

## 2020-06-19 DIAGNOSIS — Z23 Encounter for immunization: Secondary | ICD-10-CM | POA: Diagnosis not present

## 2020-07-03 ENCOUNTER — Encounter: Payer: Self-pay | Admitting: Urology

## 2020-07-03 ENCOUNTER — Other Ambulatory Visit: Payer: Self-pay

## 2020-07-03 ENCOUNTER — Ambulatory Visit (INDEPENDENT_AMBULATORY_CARE_PROVIDER_SITE_OTHER): Payer: Medicare Other | Admitting: Urology

## 2020-07-03 VITALS — BP 118/65 | HR 102 | Temp 98.3°F | Ht 68.0 in | Wt 170.0 lb

## 2020-07-03 DIAGNOSIS — N2889 Other specified disorders of kidney and ureter: Secondary | ICD-10-CM

## 2020-07-03 LAB — MICROSCOPIC EXAMINATION
Bacteria, UA: NONE SEEN
Epithelial Cells (non renal): NONE SEEN /hpf (ref 0–10)
Renal Epithel, UA: NONE SEEN /hpf
WBC, UA: NONE SEEN /hpf (ref 0–5)

## 2020-07-03 LAB — URINALYSIS, ROUTINE W REFLEX MICROSCOPIC
Bilirubin, UA: NEGATIVE
Glucose, UA: NEGATIVE
Ketones, UA: NEGATIVE
Leukocytes,UA: NEGATIVE
Nitrite, UA: NEGATIVE
Protein,UA: NEGATIVE
Specific Gravity, UA: <1.005 — ABNORMAL LOW (ref 1.005–1.030)
Urobilinogen, Ur: 0.2 mg/dL (ref 0.2–1.0)
pH, UA: 5 (ref 5.0–7.5)

## 2020-07-03 NOTE — Progress Notes (Signed)
07/03/2020 12:04 PM   Brett Russell May 30, 1946 [id number]  Referring provider: Celene Squibb, MD [id number] [name],  [name] [address]  followup renal mass   HPI: Brett Russell is a 74yo here for followup for a left renal mass. He did not get his renal US. He denies any LUTS currently, no stone events since last visit. No hematuria or dysuria.   PMH: Past Medical History:  Diagnosis Date  . AAA (abdominal aortic aneurysm) ([facility])   . Allergy    takes Allegra daily  . Anxiety    takes Xanax nightly  . Aortic aneurysm ([place]) 2007  . Arthritis   . COPD (chronic obstructive pulmonary disease) ([place])   . Enlarged prostate    slightly  . GERD (gastroesophageal reflux disease)    takes Omeprazole daily  . Gout    takes Allopurinol nightly  . History of bronchitis    many yrs ago  . History of colon polyps    benign  . History of kidney stones    hx of  . Hypertension    takes Amlodipine daily  . Insomnia    takes Melatonin nightly  . Macular degeneration    wet-right eye .Injection of AvAStin every 10 wks  . Parkinson disease ([place]) 2006   takes Sinemet daily  . 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 ABDOMINAL AORTIC ANEURYSM REPAIR  2007  . ABDOMINAL AORTIC ENDOVASCULAR STENT GRAFT Left 12/03/2016   Procedure: INSERTION OF LEFT COMMON  ILIAC STENT-LEFT INTERNAL ILIAC  ARTERY COILING;  Surgeon: Rosetta Posner, MD;  Location: [facility];  Service: Vascular;  Laterality: Left;  . APPENDECTOMY    . CHOLECYSTECTOMY    . COLONOSCOPY N/A 11/29/2012   Procedure: COLONOSCOPY;  Surgeon: Rogene Houston, MD;  [facility];  Service: Endoscopy;  Laterality: N/A;  1200-moved to [place] notified pt  . COLONOSCOPY N/A 09/20/2018   Procedure: COLONOSCOPY;  Surgeon: Rogene Houston, MD;  [facility];  Service: Endoscopy;  Laterality: N/A;  2:25  . CYST EXCISION N/A  01/07/2020   Procedure: excision of scrotal sebaceous cyst;  Surgeon: Cleon Gustin, MD;  [facility];  Service: Urology;  Laterality: N/A;  . KIDNEY SURGERY  2007   Tumor removed  . POLYPECTOMY  09/20/2018   Procedure: POLYPECTOMY;  Surgeon: Rogene Houston, MD;  [facility];  Service: Endoscopy;;  colon  . Right knee     Cartilage removed at age 53    Home Medications:  Allergies as of 07/03/2020      Reactions   Asa [aspirin] Other (See Comments)   Severe nosebleeds in the past.    Morphine And Related    Hallucinations/attempt to elope from the hospital   Flu Virus Vaccine    Rash       Medication List       Accurate as of July 03, 2020 12:04 PM. If you have any questions, ask your nurse or doctor.        acetaminophen 500 MG tablet Commonly known as: TYLENOL Take 500-1,000 mg by mouth every 6 (six) hours as needed for moderate pain.   allopurinol 300 MG tablet Commonly known as: ZYLOPRIM Take 300 mg by mouth at bedtime.   ALPRAZolam 1 MG tablet Commonly known as: XANAX Take 0.5 mg  by mouth 2 (two) times daily.   amLODipine 10 MG tablet Commonly known as: NORVASC Take 10 mg by mouth at bedtime.   Avastin 100 MG/4ML Soln Generic drug: Bevacizumab Inject into the vein.   bacitracin 500 UNIT/GM ointment Apply 1 application topically 2 (two) times daily.   carbidopa-levodopa 25-100 MG tablet Commonly known as: SINEMET IR Take 1 tablet by mouth in the morning and at bedtime.   clotrimazole-betamethasone cream Commonly known as: LOTRISONE Apply 1 application topically 2 (two) times daily as needed (jock itch).   fexofenadine 180 MG tablet Commonly known as: ALLEGRA Take 180 mg by mouth daily as needed for allergies.   melatonin 5 MG Tabs Take 5 mg by mouth at bedtime as needed (sleep).   montelukast 10 MG tablet Commonly known as: SINGULAIR Take 10 mg by mouth daily.   omeprazole 20 MG capsule Commonly known as:  PRILOSEC Take 20 mg by mouth daily before breakfast.   ondansetron 4 MG disintegrating tablet Commonly known as: ZOFRAN-ODT Take 4 mg by mouth 4 (four) times daily as needed for nausea or vomiting.   polyethylene glycol 17 g packet Commonly known as: MIRALAX / GLYCOLAX Take 17 g by mouth daily.   senna 8.6 MG Tabs tablet Commonly known as: SENOKOT Take 1 tablet by mouth.       Allergies:  Allergies  Allergen Reactions  . Asa [Aspirin] Other (See Comments)    Severe nosebleeds in the past.   . Morphine And Related     Hallucinations/attempt to elope from the hospital  . Flu Virus Vaccine     Rash     Family History: Family History  Problem Relation Age of Onset  . Kidney disease Father   . Heart disease Father   . AAA (abdominal aortic aneurysm) Father   . Healthy Brother   . Healthy Daughter   . Constipation Daughter   . Healthy Daughter     Social History:  reports that he has been smoking cigarettes. He started smoking about 50 years ago. He has a 49.00 pack-year smoking history. He has never used smokeless tobacco. He reports that he does not drink alcohol and does not use drugs.  ROS: All other review of systems were reviewed and are negative except what is noted above in HPI  Physical Exam: BP 118/65   Pulse (!) 102   Temp 98.3 F (36.8 C)   Ht 5\' 8"  (1.727 m)   Wt 170 lb (77.1 kg)   BMI 25.85 kg/m   Constitutional:  Alert and oriented, No acute distress. HEENT: [place] AT, moist mucus membranes.  Trachea midline, no masses. Cardiovascular: No clubbing, cyanosis, or edema. Respiratory: Normal respiratory effort, no increased work of breathing. GI: Abdomen is soft, nontender, nondistended, no abdominal masses GU: No CVA tenderness.  Lymph: No cervical or inguinal lymphadenopathy. Skin: No rashes, bruises or suspicious lesions. Neurologic: Grossly intact, no focal deficits, moving all 4 extremities. Psychiatric: Normal mood and affect.  Laboratory  Data: Lab Results  Component Value Date   WBC 8.1 10/26/2019   HGB 14.0 10/26/2019   HCT 44.9 10/26/2019   MCV 93.2 10/26/2019   PLT 204 10/26/2019    Lab Results  Component Value Date   CREATININE 0.68 10/26/2019    No results found for: PSA  No results found for: TESTOSTERONE  No results found for: HGBA1C  Urinalysis    Component Value Date/Time   COLORURINE YELLOW 10/24/2019 2033   APPEARANCEUR Clear 07/03/2020 1135  LABSPEC >1.046 (H) 10/24/2019 2033   PHURINE 5.0 10/24/2019 2033   GLUCOSEU Negative 07/03/2020 1135   HGBUR SMALL (A) 10/24/2019 2033   BILIRUBINUR Negative 07/03/2020 [place] 10/24/2019 2033   PROTEINUR Negative 07/03/2020 1135   PROTEINUR 30 (A) 10/24/2019 2033   NITRITE Negative 07/03/2020 1135   NITRITE NEGATIVE 10/24/2019 2033   LEUKOCYTESUR Negative 07/03/2020 [place] 10/24/2019 2033    Lab Results  Component Value Date   LABMICR See below: 07/03/2020   WBCUA None seen 07/03/2020   LABEPIT None seen 07/03/2020   BACTERIA None seen 07/03/2020    Pertinent Imaging:  Results for orders placed during the hospital encounter of 10/24/19  DG Abdomen 1 View  Narrative CLINICAL DATA:  Diffuse abdominal pain. Constipation.  EXAM: ABDOMEN - 1 VIEW  COMPARISON:  CT scan of the abdomen dated 01/04/2017  FINDINGS: There are multiple slightly distended small bowel loops in the mid abdomen. The colon and stomach are not distended.  Extensive aortic atherosclerosis. Cholecystectomy. Stents in the left iliac artery. Occlusion coils in the left internal iliac artery.  No significant bone abnormality.  IMPRESSION: Findings consistent with  small bowel obstruction.   Electronically Signed By: Lorriane Shire M.D. On: 10/24/2019 14:50  No results found for this or any previous visit.  No results found for this or any previous visit.  No results found for this or any previous visit.  No  results found for this or any previous visit.  No results found for this or any previous visit.  No results found for this or any previous visit.  No results found for this or any previous visit.   Assessment & Plan:    1. Left renal mass -renal US, will call with results. If the mass is stable I will see him back in 6 months with a renal US - Urinalysis, Routine w reflex microscopic - Ultrasound renal complete; Future - Ultrasound renal complete; Future   Return in about 6 months (around 01/01/2021) for renal US.  Nicolette Bang, MD  [facility] [place]

## 2020-07-03 NOTE — Patient Instructions (Signed)
Renal Mass  A renal mass is a growth in the kidney. A renal mass may be found while performing an MRI, CT scan, or ultrasound for other problems of the abdomen. Certain types of cancers, infections, or injuries can cause a renal mass. A renal mass that is cancerous (malignant) may grow or spread quickly. Others are harmless (benign). What are common types of renal masses? Renal masses include:  Tumors. These may be cancerous (malignant) or noncancerous (benign). ? The most common type of kidney cancer is renal cell carcinoma. ? The most common benign tumors of the kidney include renal adenomas, oncocytomas, and angiomyolipoma (AML).  Cysts. These are fluid-filled sacs that form on or in the kidney. ? It is not always known what causes a cyst to develop in or on the kidney. ? Most kidney cysts do not cause symptoms and do not need to be treated. What type of testing might I need? Your health care provider may recommend that you have tests to diagnose the cause of your renal mass. The following tests may be done if a renal mass is found:  Physical exam.  Blood tests.  Urine tests.  Imaging tests, such as ultrasound, CT scan, or MRI.  Biopsy. This is a small sample that is removed from the renal mass and tested in a lab. The exact tests and how often they are done will depend on:  The size and appearance of the renal mass.  Risk factors or medical conditions that increase your risk for problems.  Any symptoms associated with the renal mass, or concerns that you have about it. Tests and physical exams may be done once, or they may be done regularly for a period of time. Tests and exams that are done regularly will help monitor whether the mass is growing and beginning to cause problems. What are common treatments for renal masses? Treatment is not always needed for this condition. Your health care provider may recommend careful monitoring (watchful waiting) and regular tests and exams.  Treatment will depend on the cause of the mass. Follow these instructions at home: What you need to do at home will depend on the cause of the mass. Follow the instructions that your health care provider gives to you. In general:  Take over-the-counter and prescription medicines only as told by your health care provider.  If you are prescribed an antibiotic medicine, take it as told by your health care provider. Do not stop taking the antibiotic even if you start to feel better.  Follow any restrictions that are given to you by your health care provider.  Keep all follow-up visits as told by your health care provider. This is important. ? You may need to see your health care provider once or twice a year to have CT scans and ultrasounds done. These tests will show if your renal mass has changed or grown bigger. Contact a health care provider if you:  Have pain in the side or back (flank pain).  Have a fever.  Feel full soon after eating.  Have pain or swelling in the abdomen.  Lose weight. Get help right away if:  Your pain gets worse.  There is blood in your urine.  You cannot urinate.  You have chest pain.  You have trouble breathing. Summary  A renal mass is a growth in the kidney. It may be cancerous (malignant) and grow or spread quickly, or it may be harmless (benign).  Renal masses may be found while performing   an MRI, CT scan, or ultrasound for other problems of the abdomen.  Your health care provider may recommend that you have tests to diagnose the cause of your renal mass. This may include a physical exam, blood tests, urine tests, imaging, or a biopsy.  Treatment is not always needed for this condition. Careful monitoring (watchful waiting) may be recommended. This information is not intended to replace advice given to you by your health care provider. Make sure you discuss any questions you have with your health care provider. Document Revised: 08/11/2017  Document Reviewed: 08/11/2017 Elsevier Patient Education  2020 Elsevier Inc.  

## 2020-07-03 NOTE — Progress Notes (Signed)
Urological Symptom Review  Patient is experiencing the following symptoms: None   Review of Systems  Gastrointestinal (upper)  : Negative for upper GI symptoms  Gastrointestinal (lower) : Constipation  Constitutional : Negative for symptoms  Skin: Negative for skin symptoms  Eyes: Negative for eye symptoms  Ear/Nose/Throat : Sinus problems  Hematologic/Lymphatic: Negative for Hematologic/Lymphatic symptoms  Cardiovascular : Negative for cardiovascular symptoms  Respiratory : Cough  Endocrine: Negative for endocrine symptoms  Musculoskeletal: Back pain  Neurological: Negative for neurological symptoms  Psychologic: Anxiety

## 2020-07-17 ENCOUNTER — Ambulatory Visit (HOSPITAL_COMMUNITY)
Admission: RE | Admit: 2020-07-17 | Discharge: 2020-07-17 | Disposition: A | Discharge status: Home / Self Care | Payer: Medicare Other | Source: Ambulatory Visit | Attending: Urology | Admitting: Urology

## 2020-07-17 ENCOUNTER — Other Ambulatory Visit: Payer: Self-pay

## 2020-07-17 DIAGNOSIS — N281 Cyst of kidney, acquired: Secondary | ICD-10-CM | POA: Diagnosis not present

## 2020-07-17 DIAGNOSIS — N2889 Other specified disorders of kidney and ureter: Secondary | ICD-10-CM | POA: Diagnosis not present

## 2020-07-28 ENCOUNTER — Telehealth: Payer: Self-pay

## 2020-07-28 NOTE — Telephone Encounter (Signed)
Pt had called earlier asking about [place] results. Spoke with Dr. Alyson Ingles and notified pt per him that results were fine. Pt expressed understanding.

## 2020-07-29 NOTE — Progress Notes (Signed)
Sent via mychart

## 2020-07-31 ENCOUNTER — Other Ambulatory Visit: Payer: Self-pay

## 2020-07-31 DIAGNOSIS — N2889 Other specified disorders of kidney and ureter: Secondary | ICD-10-CM

## 2020-09-25 DIAGNOSIS — H43813 Vitreous degeneration, bilateral: Secondary | ICD-10-CM | POA: Diagnosis not present

## 2020-09-25 DIAGNOSIS — H353122 Nonexudative age-related macular degeneration, left eye, intermediate dry stage: Secondary | ICD-10-CM | POA: Diagnosis not present

## 2020-09-25 DIAGNOSIS — H35372 Puckering of macula, left eye: Secondary | ICD-10-CM | POA: Diagnosis not present

## 2020-09-25 DIAGNOSIS — H353211 Exudative age-related macular degeneration, right eye, with active choroidal neovascularization: Secondary | ICD-10-CM | POA: Diagnosis not present

## 2020-09-29 DIAGNOSIS — Z961 Presence of intraocular lens: Secondary | ICD-10-CM | POA: Diagnosis not present

## 2020-09-29 DIAGNOSIS — H524 Presbyopia: Secondary | ICD-10-CM | POA: Diagnosis not present

## 2020-11-19 DIAGNOSIS — I10 Essential (primary) hypertension: Secondary | ICD-10-CM | POA: Diagnosis not present

## 2020-11-19 DIAGNOSIS — R718 Other abnormality of red blood cells: Secondary | ICD-10-CM | POA: Diagnosis not present

## 2020-11-19 DIAGNOSIS — R7303 Prediabetes: Secondary | ICD-10-CM | POA: Diagnosis not present

## 2020-11-19 DIAGNOSIS — E782 Mixed hyperlipidemia: Secondary | ICD-10-CM | POA: Diagnosis not present

## 2020-11-19 DIAGNOSIS — G20 Parkinson's disease: Secondary | ICD-10-CM | POA: Diagnosis not present

## 2020-11-19 DIAGNOSIS — J449 Chronic obstructive pulmonary disease, unspecified: Secondary | ICD-10-CM | POA: Diagnosis not present

## 2020-11-19 DIAGNOSIS — G4700 Insomnia, unspecified: Secondary | ICD-10-CM | POA: Diagnosis not present

## 2020-11-19 DIAGNOSIS — Z125 Encounter for screening for malignant neoplasm of prostate: Secondary | ICD-10-CM | POA: Diagnosis not present

## 2020-11-26 DIAGNOSIS — K219 Gastro-esophageal reflux disease without esophagitis: Secondary | ICD-10-CM | POA: Diagnosis not present

## 2020-11-26 DIAGNOSIS — R269 Unspecified abnormalities of gait and mobility: Secondary | ICD-10-CM | POA: Diagnosis not present

## 2020-11-26 DIAGNOSIS — K5669 Other partial intestinal obstruction: Secondary | ICD-10-CM | POA: Diagnosis not present

## 2020-11-26 DIAGNOSIS — M109 Gout, unspecified: Secondary | ICD-10-CM | POA: Diagnosis not present

## 2020-11-26 DIAGNOSIS — R7301 Impaired fasting glucose: Secondary | ICD-10-CM | POA: Diagnosis not present

## 2020-11-26 DIAGNOSIS — F419 Anxiety disorder, unspecified: Secondary | ICD-10-CM | POA: Diagnosis not present

## 2020-11-26 DIAGNOSIS — G20 Parkinson's disease: Secondary | ICD-10-CM | POA: Diagnosis not present

## 2020-11-26 DIAGNOSIS — I10 Essential (primary) hypertension: Secondary | ICD-10-CM | POA: Diagnosis not present

## 2020-11-26 DIAGNOSIS — J449 Chronic obstructive pulmonary disease, unspecified: Secondary | ICD-10-CM | POA: Diagnosis not present

## 2020-11-26 DIAGNOSIS — J302 Other seasonal allergic rhinitis: Secondary | ICD-10-CM | POA: Diagnosis not present

## 2020-11-26 DIAGNOSIS — G4700 Insomnia, unspecified: Secondary | ICD-10-CM | POA: Diagnosis not present

## 2020-11-26 DIAGNOSIS — E782 Mixed hyperlipidemia: Secondary | ICD-10-CM | POA: Diagnosis not present

## 2020-12-08 ENCOUNTER — Other Ambulatory Visit: Payer: Self-pay

## 2020-12-08 DIAGNOSIS — I739 Peripheral vascular disease, unspecified: Secondary | ICD-10-CM

## 2020-12-08 DIAGNOSIS — Z95828 Presence of other vascular implants and grafts: Secondary | ICD-10-CM

## 2020-12-22 DIAGNOSIS — Z23 Encounter for immunization: Secondary | ICD-10-CM | POA: Diagnosis not present

## 2021-01-02 ENCOUNTER — Ambulatory Visit: Payer: Medicare Other | Admitting: Urology

## 2021-01-15 DIAGNOSIS — H353211 Exudative age-related macular degeneration, right eye, with active choroidal neovascularization: Secondary | ICD-10-CM | POA: Diagnosis not present

## 2021-01-15 DIAGNOSIS — H35372 Puckering of macula, left eye: Secondary | ICD-10-CM | POA: Diagnosis not present

## 2021-01-15 DIAGNOSIS — H43813 Vitreous degeneration, bilateral: Secondary | ICD-10-CM | POA: Diagnosis not present

## 2021-01-15 DIAGNOSIS — E1165 Type 2 diabetes mellitus with hyperglycemia: Secondary | ICD-10-CM | POA: Diagnosis not present

## 2021-01-15 DIAGNOSIS — H353122 Nonexudative age-related macular degeneration, left eye, intermediate dry stage: Secondary | ICD-10-CM | POA: Diagnosis not present

## 2021-01-15 DIAGNOSIS — I10 Essential (primary) hypertension: Secondary | ICD-10-CM | POA: Diagnosis not present

## 2021-02-02 IMAGING — DX LUMBAR SPINE - COMPLETE 4+ VIEW
5 series · 5 of 5 positions shown · non-contrast
Comparison: 03/22/2017

CLINICAL DATA: Worsening low back pain over the past 2 weeks
radiating to both lower extremities.

EXAM:
LUMBAR SPINE - COMPLETE 4+ VIEW

[l-spine ap]
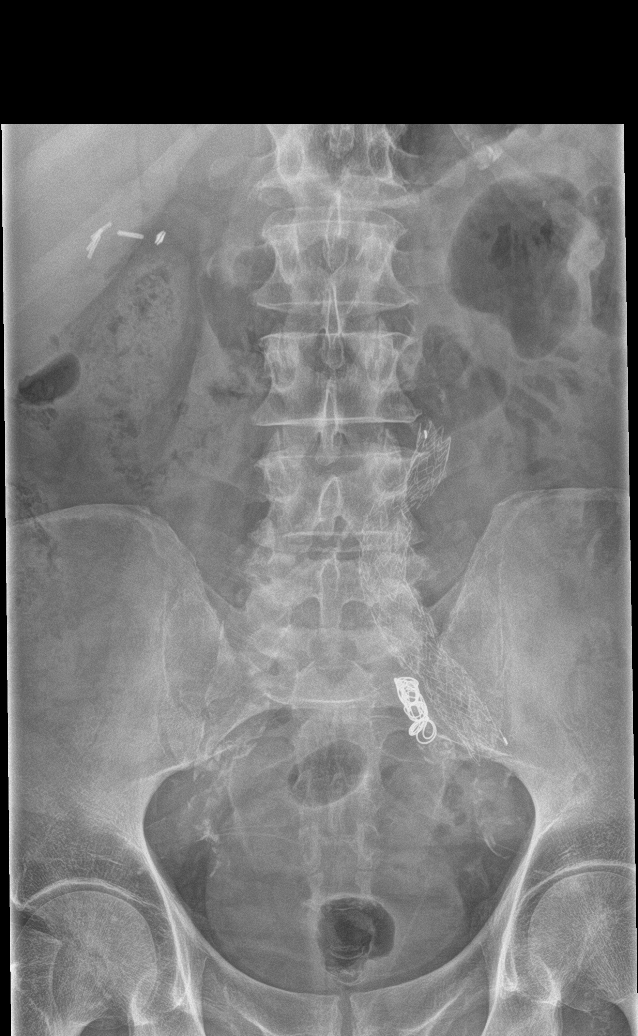

[l-spine obl (1 of 2)]
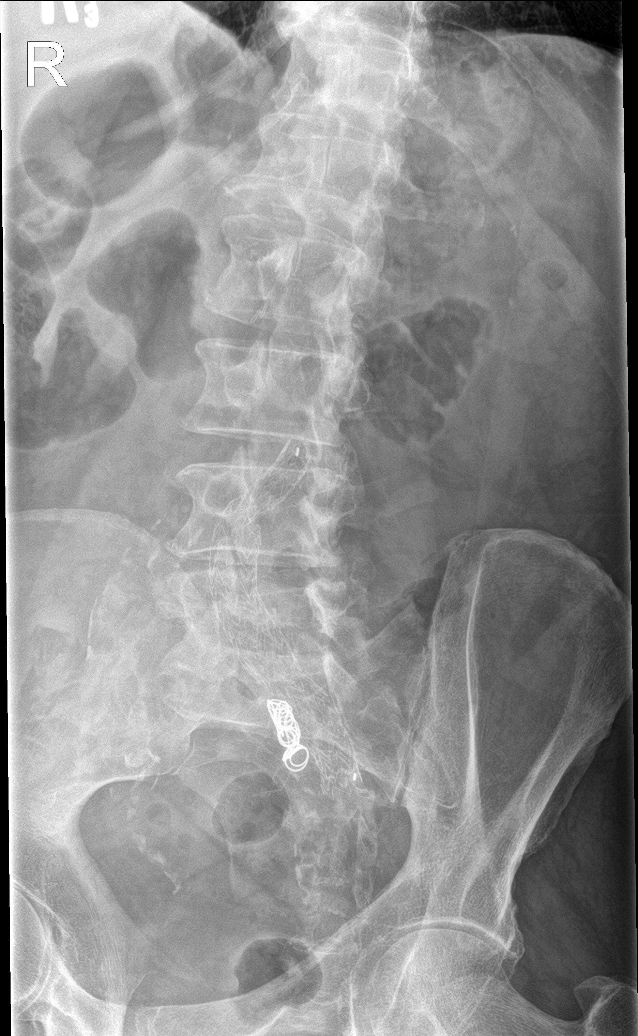

[l-spine obl (2 of 2)]
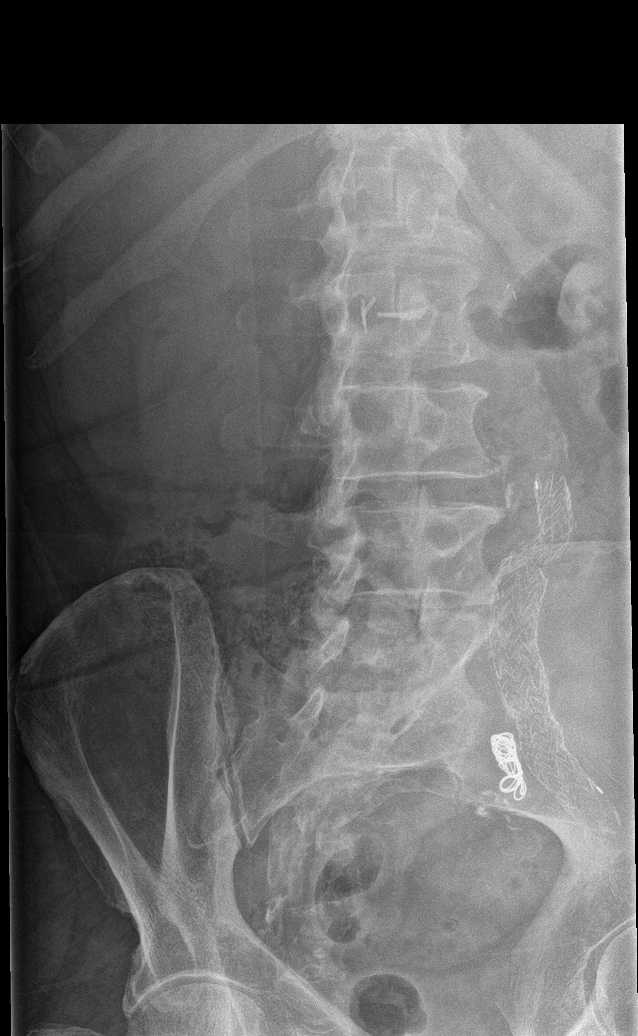

[l-spine lat]
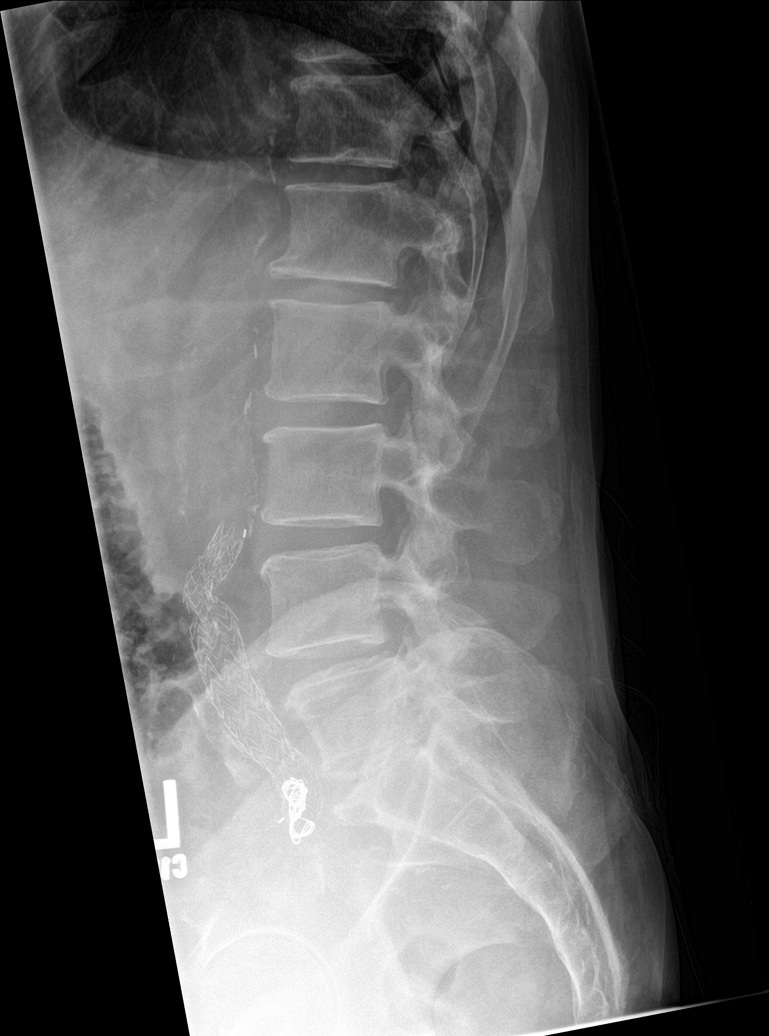

[l-spine spot]
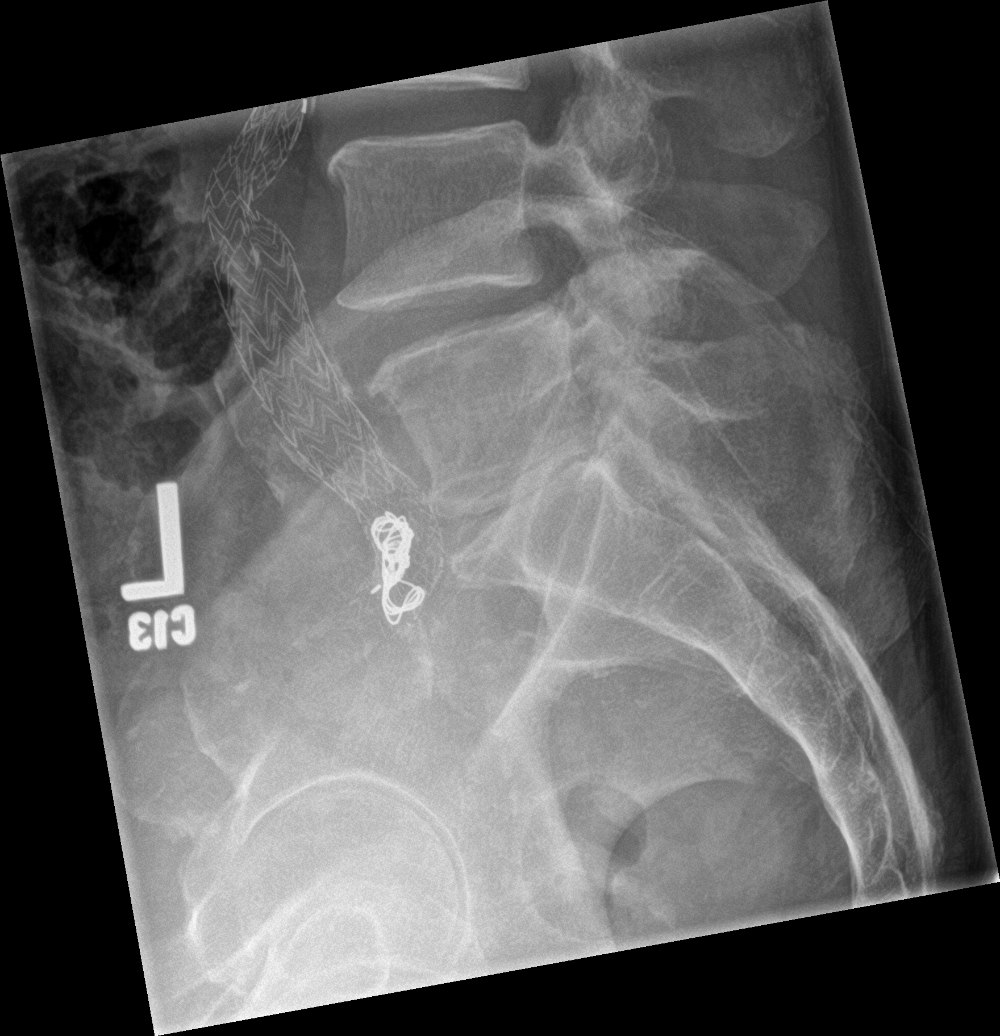

[5 of 5 positions shown; findings below may reference images not displayed]

FINDINGS: Vertebral body alignment and heights are normal. There is mild
spondylosis of the lumbar spine to include facet arthropathy over
the lower lumbar spine. Mild disc space narrowing at the L5-S1 level
and to lesser extent at the L4-5 level. No evidence of compression
fracture or spondylolisthesis. No evidence of spondylolysis. Aorta
left iliac stent graft unchanged. Vascular coil material projects
over the expected region of the left internal iliac artery.
Remainder of the exam is unchanged.
IMPRESSION: No acute findings.

Mild spondylosis of the lumbar spine with disc disease at the L4-5
and L5-S1 levels.

## 2021-02-23 ENCOUNTER — Other Ambulatory Visit: Payer: Self-pay | Admitting: *Deleted

## 2021-02-23 DIAGNOSIS — I723 Aneurysm of iliac artery: Secondary | ICD-10-CM

## 2021-02-23 DIAGNOSIS — I739 Peripheral vascular disease, unspecified: Secondary | ICD-10-CM

## 2021-02-23 NOTE — Progress Notes (Signed)
error 

## 2021-03-06 ENCOUNTER — Ambulatory Visit: Payer: Medicare Other

## 2021-03-06 ENCOUNTER — Encounter (HOSPITAL_COMMUNITY): Payer: Medicare Other

## 2021-03-11 ENCOUNTER — Other Ambulatory Visit: Payer: Self-pay

## 2021-03-11 ENCOUNTER — Ambulatory Visit (INDEPENDENT_AMBULATORY_CARE_PROVIDER_SITE_OTHER): Payer: Medicare Other | Admitting: Physician Assistant

## 2021-03-11 ENCOUNTER — Ambulatory Visit (INDEPENDENT_AMBULATORY_CARE_PROVIDER_SITE_OTHER)
Admission: RE | Admit: 2021-03-11 | Discharge: 2021-03-11 | Disposition: A | Discharge status: Home / Self Care | Payer: Medicare Other | Source: Ambulatory Visit | Attending: Vascular Surgery | Admitting: Vascular Surgery

## 2021-03-11 ENCOUNTER — Encounter: Payer: Self-pay | Admitting: Physician Assistant

## 2021-03-11 ENCOUNTER — Ambulatory Visit (HOSPITAL_COMMUNITY)
Admission: RE | Admit: 2021-03-11 | Discharge: 2021-03-11 | Disposition: A | Discharge status: Home / Self Care | Payer: Medicare Other | Source: Ambulatory Visit | Attending: Vascular Surgery | Admitting: Vascular Surgery

## 2021-03-11 VITALS — BP 129/77 | HR 92 | Temp 97.6°F | Ht 68.5 in | Wt 153.7 lb

## 2021-03-11 DIAGNOSIS — I739 Peripheral vascular disease, unspecified: Secondary | ICD-10-CM | POA: Insufficient documentation

## 2021-03-11 DIAGNOSIS — Z9889 Other specified postprocedural states: Secondary | ICD-10-CM

## 2021-03-11 DIAGNOSIS — I723 Aneurysm of iliac artery: Secondary | ICD-10-CM

## 2021-03-11 DIAGNOSIS — Z8679 Personal history of other diseases of the circulatory system: Secondary | ICD-10-CM

## 2021-03-11 NOTE — Progress Notes (Signed)
Office Note     CC:  follow up Requesting Provider:  Celene Squibb, MD  HPI: Brett Russell is a 75 y.o. (1946-03-15) male who presents for routine surveillance.  He underwent open AAA repair with tube graft in 2007 by Dr. Kellie Simmering.  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 Donnetta Hutching in 2018.  He denies any claudication, rest pain, or tissue changes of bilateral lower extremities.  He continues to require daily laxatives to help with bowel movements since surgery in 2007.  He was hospitalized about 18 months ago with small bowel obstruction that resolved with conservative management.  He has a daily tobacco user.  He has an allergy to aspirin.     Past Medical History:  Diagnosis Date   AAA (abdominal aortic aneurysm) ([facility])    Allergy    takes Allegra daily   Anxiety    takes Xanax nightly   Aortic aneurysm ([place]) 2007   Arthritis    COPD (chronic obstructive pulmonary disease) ([facility])    Enlarged prostate    slightly   GERD (gastroesophageal reflux disease)    takes Omeprazole daily   Gout    takes Allopurinol nightly   History of bronchitis    many yrs ago   History of colon polyps    benign   History of kidney stones    hx of   Hypertension    takes Amlodipine daily   Insomnia    takes Melatonin nightly   Macular degeneration    wet-right eye .Injection of AvAStin every 10 wks   Parkinson disease ([place]) 2006   takes Sinemet daily   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    Past Surgical History:  Procedure Laterality Date   ABDOMINAL AORTIC ANEURYSM REPAIR  2007   ABDOMINAL AORTIC ENDOVASCULAR STENT GRAFT Left 12/03/2016   Procedure: INSERTION OF LEFT COMMON  ILIAC STENT-LEFT INTERNAL ILIAC  ARTERY COILING;  Surgeon: Rosetta Posner, MD;  Location: [facility];  Service: Vascular;  Laterality: Left;   APPENDECTOMY     CHOLECYSTECTOMY     COLONOSCOPY N/A 11/29/2012   Procedure: COLONOSCOPY;  Surgeon: Rogene Houston, MD;   [facility];  Service: Endoscopy;  Laterality: N/A;  1200-moved to [place]  [place] notified pt   COLONOSCOPY N/A 09/20/2018   Procedure: COLONOSCOPY;  Surgeon: Rogene Houston, MD;  [facility];  Service: Endoscopy;  Laterality: N/A;  2:25   CYST EXCISION N/A 01/07/2020   Procedure: excision of scrotal sebaceous cyst;  Surgeon: Cleon Gustin, MD;  [facility];  Service: Urology;  Laterality: N/A;   KIDNEY SURGERY  2007   Tumor removed   POLYPECTOMY  09/20/2018   Procedure: POLYPECTOMY;  Surgeon: Rogene Houston, MD;  [facility];  Service: Endoscopy;;  colon   Right knee     Cartilage removed at age 4    Social History   Socioeconomic History   Marital status: Divorced    Spouse name: Not on file   Number of children: Not on file   Years of education: Not on file   Highest education level: Not on file  Occupational History   Not on file  Tobacco Use   Smoking status: Every Day    Packs/day: 1.00    Years: 49.00    Pack years: 49.00    Types: Cigarettes    Start date: 08/11/1969  Smokeless tobacco: Never  Vaping Use   Vaping Use: Never used  Substance and Sexual Activity   Alcohol use: No    Alcohol/week: 0.0 standard drinks   Drug use: No   Sexual activity: Not on file  Other Topics Concern   Not on file  Social History Narrative   Not on file   Social Determinants of Health   Financial Resource Strain: Not on file  Food Insecurity: Not on file  Transportation Needs: Not on file  Physical Activity: Not on file  Stress: Not on file  Social Connections: Not on file  Intimate Partner Violence: Not on file    Family History  Problem Relation Age of Onset   Kidney disease Father    Heart disease Father    AAA (abdominal aortic aneurysm) Father    Healthy Brother    Healthy Daughter    Constipation Daughter    Healthy Daughter     Current Outpatient Medications  Medication Sig Dispense Refill   acetaminophen (TYLENOL)  500 MG tablet Take 500-1,000 mg by mouth every 6 (six) hours as needed for moderate pain.      allopurinol (ZYLOPRIM) 300 MG tablet Take 300 mg by mouth at bedtime.      ALPRAZolam (XANAX) 1 MG tablet Take 0.5 mg by mouth 2 (two) times daily.      amLODipine (NORVASC) 10 MG tablet Take 10 mg by mouth at bedtime.      Bevacizumab (AVASTIN) 100 MG/4ML SOLN Inject into the vein.     carbidopa-levodopa (SINEMET IR) 25-100 MG per tablet Take 1 tablet by mouth in the morning and at bedtime.      clotrimazole-betamethasone (LOTRISONE) cream Apply 1 application topically 2 (two) times daily as needed (jock itch).     fexofenadine (ALLEGRA) 180 MG tablet Take 180 mg by mouth daily as needed for allergies.      melatonin 5 MG TABS Take 5 mg by mouth at bedtime as needed (sleep).      montelukast (SINGULAIR) 10 MG tablet Take 10 mg by mouth daily.     omeprazole (PRILOSEC) 20 MG capsule Take 20 mg by mouth daily before breakfast.      ondansetron (ZOFRAN-ODT) 4 MG disintegrating tablet Take 4 mg by mouth 4 (four) times daily as needed for nausea or vomiting.     polyethylene glycol (MIRALAX / GLYCOLAX) 17 g packet Take 17 g by mouth daily.     senna (SENOKOT) 8.6 MG TABS tablet Take 1 tablet by mouth.     bacitracin 500 UNIT/GM ointment Apply 1 application topically 2 (two) times daily. (Patient not taking: No sig reported) 15 g 0   No current facility-administered medications for this visit.    Allergies  Allergen Reactions   Asa [Aspirin] Other (See Comments)    Severe nosebleeds in the past.    Morphine And Related     Hallucinations/attempt to elope from the hospital   Influenza Virus Vaccine     Rash      REVIEW OF SYSTEMS:   '[X]'$  denotes positive finding, '[ ]'$  denotes negative finding Cardiac  Comments:  Chest pain or chest pressure:    Shortness of breath upon exertion:    Short of breath when lying flat:    Irregular heart rhythm:        Vascular    Pain in calf, thigh, or hip  brought on by ambulation:    Pain in feet at night that wakes you up from  your sleep:     Blood clot in your veins:    Leg swelling:         Pulmonary    Oxygen at home:    Productive cough:     Wheezing:         Neurologic    Sudden weakness in arms or legs:     Sudden numbness in arms or legs:     Sudden onset of difficulty speaking or slurred speech:    Temporary loss of vision in one eye:     Problems with dizziness:         Gastrointestinal    Blood in stool:     Vomited blood:         Genitourinary    Burning when urinating:     Blood in urine:        Psychiatric    Major depression:         Hematologic    Bleeding problems:    Problems with blood clotting too easily:        Skin    Rashes or ulcers:        Constitutional    Fever or chills:      PHYSICAL EXAMINATION:  Vitals:   03/11/21 0928  BP: 129/77  Pulse: 92  Temp: 97.6 F (36.4 C)  TempSrc: Temporal  SpO2: 96%  Weight: 153 lb 11.2 oz (69.7 kg)  Height: 5' 8.5" (1.74 m)    General:  WDWN in NAD; vital signs documented above Gait: Not observed HENT: WNL, normocephalic Pulmonary: normal non-labored breathing , without Rales, rhonchi,  wheezing Cardiac: regular HR Abdomen: soft, NT, no masses Skin: without rashes Vascular Exam/Pulses:  Right Left  Radial 2+ (normal) 2+ (normal)  DP 2+ (normal) 2+ (normal)  PT 2+ (normal) 2+ (normal)   Extremities: without ischemic changes, without Gangrene , without cellulitis; without open wounds;  Musculoskeletal: no muscle wasting or atrophy  Neurologic: A&O X 3;  No focal weakness or paresthesias are detected Psychiatric:  The pt has Normal affect.   Non-Invasive Vascular Imaging:   Left common iliac artery stent widely patent  ABIs within normal limits bilaterally    ASSESSMENT/PLAN:: 75 y.o. male here for follow up for surveillance of aortoiliac aneurysmal disease as well as PAD  -Bilateral lower extremities are well perfused with  palpable pedal pulses -Duplex demonstrates a widely patent left common iliac artery stent as well as ABIs within normal limits -Encourage smoking cessation -Recheck aortoiliac duplex and ABIs in 1 year per protocol   Dagoberto Ligas, PA-C [facility] [phone]  Clinic MD:   [place]

## 2021-04-17 DIAGNOSIS — Z23 Encounter for immunization: Secondary | ICD-10-CM | POA: Diagnosis not present

## 2021-05-21 DIAGNOSIS — H353122 Nonexudative age-related macular degeneration, left eye, intermediate dry stage: Secondary | ICD-10-CM | POA: Diagnosis not present

## 2021-05-21 DIAGNOSIS — H353211 Exudative age-related macular degeneration, right eye, with active choroidal neovascularization: Secondary | ICD-10-CM | POA: Diagnosis not present

## 2021-05-21 DIAGNOSIS — H35372 Puckering of macula, left eye: Secondary | ICD-10-CM | POA: Diagnosis not present

## 2021-05-21 DIAGNOSIS — H43813 Vitreous degeneration, bilateral: Secondary | ICD-10-CM | POA: Diagnosis not present

## 2021-05-29 DIAGNOSIS — R7301 Impaired fasting glucose: Secondary | ICD-10-CM | POA: Diagnosis not present

## 2021-05-29 DIAGNOSIS — I10 Essential (primary) hypertension: Secondary | ICD-10-CM | POA: Diagnosis not present

## 2021-06-01 DIAGNOSIS — J449 Chronic obstructive pulmonary disease, unspecified: Secondary | ICD-10-CM | POA: Diagnosis not present

## 2021-06-01 DIAGNOSIS — R7301 Impaired fasting glucose: Secondary | ICD-10-CM | POA: Diagnosis not present

## 2021-06-01 DIAGNOSIS — J302 Other seasonal allergic rhinitis: Secondary | ICD-10-CM | POA: Diagnosis not present

## 2021-06-01 DIAGNOSIS — G4700 Insomnia, unspecified: Secondary | ICD-10-CM | POA: Diagnosis not present

## 2021-06-01 DIAGNOSIS — K5669 Other partial intestinal obstruction: Secondary | ICD-10-CM | POA: Diagnosis not present

## 2021-06-01 DIAGNOSIS — M109 Gout, unspecified: Secondary | ICD-10-CM | POA: Diagnosis not present

## 2021-06-01 DIAGNOSIS — K219 Gastro-esophageal reflux disease without esophagitis: Secondary | ICD-10-CM | POA: Diagnosis not present

## 2021-06-01 DIAGNOSIS — F419 Anxiety disorder, unspecified: Secondary | ICD-10-CM | POA: Diagnosis not present

## 2021-06-01 DIAGNOSIS — R269 Unspecified abnormalities of gait and mobility: Secondary | ICD-10-CM | POA: Diagnosis not present

## 2021-06-01 DIAGNOSIS — I10 Essential (primary) hypertension: Secondary | ICD-10-CM | POA: Diagnosis not present

## 2021-06-01 DIAGNOSIS — E782 Mixed hyperlipidemia: Secondary | ICD-10-CM | POA: Diagnosis not present

## 2021-06-01 DIAGNOSIS — G20 Parkinson's disease: Secondary | ICD-10-CM | POA: Diagnosis not present

## 2021-06-26 ENCOUNTER — Ambulatory Visit (HOSPITAL_COMMUNITY)
Admission: RE | Admit: 2021-06-26 | Discharge: 2021-06-26 | Disposition: A | Discharge status: Home / Self Care | Payer: Medicare Other | Source: Ambulatory Visit | Attending: Urology | Admitting: Urology

## 2021-06-26 ENCOUNTER — Other Ambulatory Visit: Payer: Self-pay

## 2021-06-26 DIAGNOSIS — N281 Cyst of kidney, acquired: Secondary | ICD-10-CM | POA: Diagnosis not present

## 2021-06-26 DIAGNOSIS — N2889 Other specified disorders of kidney and ureter: Secondary | ICD-10-CM | POA: Diagnosis not present

## 2021-07-01 ENCOUNTER — Ambulatory Visit (INDEPENDENT_AMBULATORY_CARE_PROVIDER_SITE_OTHER): Payer: Medicare Other | Admitting: Urology

## 2021-07-01 ENCOUNTER — Encounter: Payer: Self-pay | Admitting: Urology

## 2021-07-01 ENCOUNTER — Other Ambulatory Visit: Payer: Self-pay

## 2021-07-01 VITALS — BP 150/75 | HR 109

## 2021-07-01 DIAGNOSIS — Z125 Encounter for screening for malignant neoplasm of prostate: Secondary | ICD-10-CM | POA: Diagnosis not present

## 2021-07-01 DIAGNOSIS — N401 Enlarged prostate with lower urinary tract symptoms: Secondary | ICD-10-CM

## 2021-07-01 DIAGNOSIS — N138 Other obstructive and reflux uropathy: Secondary | ICD-10-CM | POA: Diagnosis not present

## 2021-07-01 DIAGNOSIS — N2889 Other specified disorders of kidney and ureter: Secondary | ICD-10-CM | POA: Diagnosis not present

## 2021-07-01 LAB — URINALYSIS, ROUTINE W REFLEX MICROSCOPIC
Bilirubin, UA: NEGATIVE
Glucose, UA: NEGATIVE
Ketones, UA: NEGATIVE
Leukocytes,UA: NEGATIVE
Nitrite, UA: NEGATIVE
Specific Gravity, UA: 1.015 (ref 1.005–1.030)
Urobilinogen, Ur: 0.2 mg/dL (ref 0.2–1.0)
pH, UA: 6.5 (ref 5.0–7.5)

## 2021-07-01 LAB — MICROSCOPIC EXAMINATION
Bacteria, UA: NONE SEEN
Epithelial Cells (non renal): NONE SEEN /hpf (ref 0–10)
Renal Epithel, UA: NONE SEEN /hpf
WBC, UA: NONE SEEN /hpf (ref 0–5)

## 2021-07-01 NOTE — Progress Notes (Signed)
07/01/2021 10:21 AM   Brett Russell Aug 25, 1945 [id number]  Referring provider: Celene Squibb, MD [address],  [place] [address]  Followup left renal cysts   HPI: Mr Brett Russell is a 75yo here for followup left renal cysts. He denies any flank pain. No hematuria. No worsening LUTS. Renal US 06/26/2021 shows stable left renal cysts with the largest was 6.2cm. No issues   PMH: Past Medical History:  Diagnosis Date   AAA (abdominal aortic aneurysm) ([place])    Allergy    takes Allegra daily   Anxiety    takes Xanax nightly   Aortic aneurysm ([place]) 2007   Arthritis    COPD (chronic obstructive pulmonary disease) ([facility])    Enlarged prostate    slightly   GERD (gastroesophageal reflux disease)    takes Omeprazole daily   Gout    takes Allopurinol nightly   History of bronchitis    many yrs ago   History of colon polyps    benign   History of kidney stones    hx of   Hypertension    takes Amlodipine daily   Insomnia    takes Melatonin nightly   Macular degeneration    wet-right eye .Injection of AvAStin every 10 wks   Parkinson disease ([place]) 2006   takes Sinemet daily   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    Surgical History: Past Surgical History:  Procedure Laterality Date   ABDOMINAL AORTIC ANEURYSM REPAIR  2007   ABDOMINAL AORTIC ENDOVASCULAR STENT GRAFT Left 12/03/2016   Procedure: INSERTION OF LEFT COMMON  ILIAC STENT-LEFT INTERNAL ILIAC  ARTERY COILING;  Surgeon: Rosetta Posner, MD;  Location: [facility];  Service: Vascular;  Laterality: Left;   APPENDECTOMY     CHOLECYSTECTOMY     COLONOSCOPY N/A 11/29/2012   Procedure: COLONOSCOPY;  Surgeon: Rogene Houston, MD;  [facility];  Service: Endoscopy;  Laterality: N/A;  1200-moved to 41  [place] notified pt   COLONOSCOPY N/A 09/20/2018   Procedure: COLONOSCOPY;  Surgeon: Rogene Houston, MD;  [facility];  Service: Endoscopy;  Laterality: N/A;  2:25   CYST EXCISION N/A  01/07/2020   Procedure: excision of scrotal sebaceous cyst;  Surgeon: Cleon Gustin, MD;  [facility];  Service: Urology;  Laterality: N/A;   KIDNEY SURGERY  2007   Tumor removed   POLYPECTOMY  09/20/2018   Procedure: POLYPECTOMY;  Surgeon: Rogene Houston, MD;  [facility];  Service: Endoscopy;;  colon   Right knee     Cartilage removed at age 50    Home Medications:  Allergies as of 07/01/2021       Reactions   Asa [aspirin] Other (See Comments)   Severe nosebleeds in the past.    Morphine And Related    Hallucinations/attempt to elope from the hospital   Influenza Virus Vaccine    Rash         Medication List        Accurate as of July 01, 2021 10:21 AM. If you have any questions, ask your nurse or doctor.          acetaminophen 500 MG tablet Commonly known as: TYLENOL Take 500-1,000 mg by mouth every 6 (six) hours as needed for moderate pain.   allopurinol 300 MG tablet Commonly known as: ZYLOPRIM Take 300 mg by mouth at bedtime.   ALPRAZolam 1 MG  tablet Commonly known as: XANAX Take 0.5 mg by mouth 2 (two) times daily.   amLODipine 10 MG tablet Commonly known as: NORVASC Take 10 mg by mouth at bedtime.   Avastin 100 MG/4ML Soln Generic drug: Bevacizumab Inject into the vein.   bacitracin 500 UNIT/GM ointment Apply 1 application topically 2 (two) times daily.   carbidopa-levodopa 25-100 MG tablet Commonly known as: SINEMET IR Take 1 tablet by mouth in the morning and at bedtime.   clotrimazole-betamethasone cream Commonly known as: LOTRISONE Apply 1 application topically 2 (two) times daily as needed (jock itch).   fexofenadine 180 MG tablet Commonly known as: ALLEGRA Take 180 mg by mouth daily as needed for allergies.   melatonin 5 MG Tabs Take 5 mg by mouth at bedtime as needed (sleep).   montelukast 10 MG tablet Commonly known as: SINGULAIR Take 10 mg by mouth daily.   nystatin 100000 UNIT/ML  suspension Commonly known as: MYCOSTATIN Take 5 mLs by mouth 4 (four) times daily.   omeprazole 20 MG capsule Commonly known as: PRILOSEC Take 20 mg by mouth daily before breakfast.   ondansetron 4 MG disintegrating tablet Commonly known as: ZOFRAN-ODT Take 4 mg by mouth 4 (four) times daily as needed for nausea or vomiting.   polyethylene glycol 17 g packet Commonly known as: MIRALAX / GLYCOLAX Take 17 g by mouth daily.   senna 8.6 MG Tabs tablet Commonly known as: SENOKOT Take 1 tablet by mouth.        Allergies:  Allergies  Allergen Reactions   Asa [Aspirin] Other (See Comments)    Severe nosebleeds in the past.    Morphine And Related     Hallucinations/attempt to elope from the hospital   Influenza Virus Vaccine     Rash     Family History: Family History  Problem Relation Age of Onset   Kidney disease Father    Heart disease Father    AAA (abdominal aortic aneurysm) Father    Healthy Brother    Healthy Daughter    Constipation Daughter    Healthy Daughter     Social History:  reports that he has been smoking cigarettes. He started smoking about 51 years ago. He has a 49.00 pack-year smoking history. He has never used smokeless tobacco. He reports that he does not drink alcohol and does not use drugs.  ROS: All other review of systems were reviewed and are negative except what is noted above in HPI  Physical Exam: BP (!) 150/75    Pulse (!) 109   Constitutional:  Alert and oriented, No acute distress. HEENT: [place] AT, moist mucus membranes.  Trachea midline, no masses. Cardiovascular: No clubbing, cyanosis, or edema. Respiratory: Normal respiratory effort, no increased work of breathing. GI: Abdomen is soft, nontender, nondistended, no abdominal masses GU: No CVA tenderness.  Lymph: No cervical or inguinal lymphadenopathy. Skin: No rashes, bruises or suspicious lesions. Neurologic: Grossly intact, no focal deficits, moving all 4  extremities. Psychiatric: Normal mood and affect.  Laboratory Data: Lab Results  Component Value Date   WBC 8.1 10/26/2019   HGB 14.0 10/26/2019   HCT 44.9 10/26/2019   MCV 93.2 10/26/2019   PLT 204 10/26/2019    Lab Results  Component Value Date   CREATININE 0.68 10/26/2019    No results found for: PSA  No results found for: TESTOSTERONE  No results found for: HGBA1C  Urinalysis    Component Value Date/Time   COLORURINE YELLOW 10/24/2019 2033   APPEARANCEUR  Clear 07/03/2020 1135   LABSPEC >1.046 (H) 10/24/2019 2033   PHURINE 5.0 10/24/2019 2033   GLUCOSEU Negative 07/03/2020 1135   HGBUR SMALL (A) 10/24/2019 2033   BILIRUBINUR Negative 07/03/2020 [place] 10/24/2019 2033   PROTEINUR Negative 07/03/2020 1135   PROTEINUR 30 (A) 10/24/2019 2033   NITRITE Negative 07/03/2020 1135   NITRITE NEGATIVE 10/24/2019 2033   LEUKOCYTESUR Negative 07/03/2020 [place] 10/24/2019 2033    Lab Results  Component Value Date   LABMICR See below: 07/03/2020   WBCUA None seen 07/03/2020   LABEPIT None seen 07/03/2020   BACTERIA None seen 07/03/2020    Pertinent Imaging: Renal US 06/26/2021: Images reviewed and discussed with the patient  Results for orders placed during the hospital encounter of 10/24/19  DG Abdomen 1 View  Narrative CLINICAL DATA:  Diffuse abdominal pain. Constipation.  EXAM: ABDOMEN - 1 VIEW  COMPARISON:  CT scan of the abdomen dated 01/04/2017  FINDINGS: There are multiple slightly distended small bowel loops in the mid abdomen. The colon and stomach are not distended.  Extensive aortic atherosclerosis. Cholecystectomy. Stents in the left iliac artery. Occlusion coils in the left internal iliac artery.  No significant bone abnormality.  IMPRESSION: Findings consistent with  small bowel obstruction.   Electronically Signed By: Lorriane Shire M.D. On: 10/24/2019 14:50  No results found for this or any  previous visit.  No results found for this or any previous visit.  No results found for this or any previous visit.  Results for orders placed during the hospital encounter of 06/26/21  Ultrasound renal complete  Narrative CLINICAL DATA:  Follow-up left renal mass.  EXAM: RENAL / URINARY TRACT ULTRASOUND COMPLETE  COMPARISON:  Renal ultrasound dated 07/17/2020 and CT abdomen pelvis dated 10/24/2019.  FINDINGS: Right Kidney:  Renal measurements: 11.4 x 5.3 x 5.8 cm = volume: 181 mL. Normal echogenicity. No hydronephrosis or shadowing stone. The subcentimeter hypodense lesion from the inferior pole of the right kidney, seen on the CT of 10/24/2019 is not visualized on this ultrasound, likely due to small size and overlying bowel gas.  Left Kidney:  Renal measurements: 11.6 x 6.4 x 6.9 cm = volume: 267 mL. Normal echogenicity. No hydronephrosis or shadowing stone. Several simple appearing cysts measure up to 6.2 cm in the lower pole and correspond to the cyst seen on the prior ultrasound and CT.  Bladder:  Appears normal for degree of bladder distention.  Other:  None.  IMPRESSION: Multiple left renal cysts, otherwise unremarkable renal ultrasound.   Electronically Signed By: Anner Crete M.D. On: 06/27/2021 03:12  No results found for this or any previous visit.  No results found for this or any previous visit.  No results found for this or any previous visit.   Assessment & Plan:    1. Left renal cysts -RTC 1 year with renal US - Urinalysis, Routine w reflex microscopic   No follow-ups on file.  Nicolette Bang, MD  [facility] [place]

## 2021-07-01 NOTE — Patient Instructions (Signed)
Renal Mass  A renal mass is an abnormal growth in the kidney. It may be found while performing an MRI, CT scan, or ultrasound to evaluate other problems of the abdomen. A renal mass that is cancerous (malignant) may grow or spread quickly. Others are not cancerous (benign). Renal masses include: Tumors. These may be malignant or benign. The most common type of kidney cancer in adults is renal cell carcinoma. In children, the most common type of kidney cancer is Wilms tumor. The most common benign tumors of the kidney include renal adenomas, oncocytomas, and angiomyolipoma (AML). Cysts. These are fluid-filled sacs that form on or in the kidney. What are the causes? Certain types of cancers, infections, or injuries can cause a renal mass. It isnot always known what causes a cyst to develop in or on the kidney. What are the signs or symptoms? Often, a renal mass does not cause any signs or symptoms; most kidney cysts donot cause symptoms. How is this diagnosed? Your health care provider may recommend tests to diagnose the cause of your renal mass. These tests may be done if a renal mass is found: Physical exam. Blood tests. Urine tests. Imaging tests, such as ultrasound, CT scan, or MRI. Biopsy. This is a small sample that is removed from the renal mass and tested in a lab. The exact tests and how often they are done will depend on: The size and appearance of the renal mass. Risk factors or medical conditions that increase your risk for problems. Any symptoms associated with the renal mass, or concerns that you have about it. Tests and physical exams may be done once, or they may be done regularly for a period of time. Tests and exams that are done regularly will help monitorwhether the mass is growing and beginning to cause problems. How is this treated? Treatment is not always needed for this condition. Your health care provider may recommend careful monitoring and regular tests and exams.  Treatment willdepend on the cause of the mass. Treatment for a cancerous renal mass may include surgical removal,chemotherapy, radiation, or immunotherapy. Most kidney cysts do not need to be treated. Follow these instructions at home: What you need to do at home will depend on the cause of the mass. Follow the instructions that your health care provider gives to you. In general: Take over-the-counter and prescription medicines only as told by your health care provider. If you were prescribed an antibiotic medicine, take it as told by your health care provider. Do not stop taking the antibiotic even if you start to feel better. Follow any restrictions that are given to you by your health care provider. Keep all follow-up visits. This is important. You may need to see your health care provider once or twice a year to have CT scans and ultrasounds. These tests will show if your renal mass has changed or grown. Contact a health care provider if you: Have pain in your side or back (flank pain). Have a fever. Feel full soon after eating. Have pain or swelling in the abdomen. Lose weight. Get help right away if: Your pain gets worse. There is blood in your urine. You cannot urinate. You have chest pain. You have trouble breathing. These symptoms may represent a serious problem that is an emergency. Do not wait to see if the symptoms will go away. Get medical help right away. Call your local emergency services (911 in the [place] Summary A renal mass is an abnormal growth in the kidney.   It may be cancerous (malignant) and grow or spread quickly, or it may not be cancerous (benign). Renal masses often do not have any signs or symptoms. Renal masses may be found while performing an MRI, CT scan, or ultrasound for other problems of the abdomen. Your health care provider may recommend that you have tests to diagnose the cause of your renal mass. These may include a physical exam, blood tests, urine  tests, imaging, or a biopsy. Treatment is not always needed for this condition. Careful monitoring may be recommended. This information is not intended to replace advice given to you by your health care provider. Make sure you discuss any questions you have with your healthcare provider. Document Revised: 12/31/2019 Document Reviewed: 12/31/2019 Elsevier Patient Education  2022 Elsevier Inc.  

## 2021-07-01 NOTE — Progress Notes (Signed)

## 2021-07-02 LAB — PSA: Prostate Specific Ag, Serum: 0.9 ng/mL (ref 0.0–4.0)

## 2021-07-15 ENCOUNTER — Telehealth: Payer: Self-pay

## 2021-07-15 NOTE — Telephone Encounter (Signed)
Patient was called and vm was left for him to call us back.

## 2021-07-15 NOTE — Telephone Encounter (Signed)
-----   Message from Cleon Gustin, MD sent at 07/08/2021  9:51 AM EST ----- normal ----- Message ----- From: Clearence Cheek, CMA Sent: 07/02/2021  11:30 AM EST To: Cleon Gustin, MD  Please review

## 2021-07-15 NOTE — Telephone Encounter (Signed)
Patient returned call and results were provided.

## 2021-07-27 ENCOUNTER — Ambulatory Visit: Payer: Medicare Other | Admitting: Urology

## 2021-09-09 ENCOUNTER — Encounter (INDEPENDENT_AMBULATORY_CARE_PROVIDER_SITE_OTHER): Payer: Self-pay | Admitting: *Deleted

## 2021-09-10 DIAGNOSIS — H43813 Vitreous degeneration, bilateral: Secondary | ICD-10-CM | POA: Diagnosis not present

## 2021-09-10 DIAGNOSIS — H353211 Exudative age-related macular degeneration, right eye, with active choroidal neovascularization: Secondary | ICD-10-CM | POA: Diagnosis not present

## 2021-09-10 DIAGNOSIS — H353122 Nonexudative age-related macular degeneration, left eye, intermediate dry stage: Secondary | ICD-10-CM | POA: Diagnosis not present

## 2021-09-10 DIAGNOSIS — H35372 Puckering of macula, left eye: Secondary | ICD-10-CM | POA: Diagnosis not present

## 2021-09-21 ENCOUNTER — Telehealth (INDEPENDENT_AMBULATORY_CARE_PROVIDER_SITE_OTHER): Payer: Self-pay

## 2021-09-21 ENCOUNTER — Other Ambulatory Visit: Payer: Self-pay

## 2021-09-21 ENCOUNTER — Encounter (INDEPENDENT_AMBULATORY_CARE_PROVIDER_SITE_OTHER): Payer: Self-pay | Admitting: Gastroenterology

## 2021-09-21 ENCOUNTER — Ambulatory Visit (INDEPENDENT_AMBULATORY_CARE_PROVIDER_SITE_OTHER): Payer: PRIVATE HEALTH INSURANCE | Admitting: Gastroenterology

## 2021-09-21 ENCOUNTER — Encounter (INDEPENDENT_AMBULATORY_CARE_PROVIDER_SITE_OTHER): Payer: Self-pay

## 2021-09-21 ENCOUNTER — Other Ambulatory Visit (INDEPENDENT_AMBULATORY_CARE_PROVIDER_SITE_OTHER): Payer: Self-pay

## 2021-09-21 VITALS — BP 138/74 | HR 101 | Temp 97.9°F | Ht 68.5 in | Wt 148.6 lb

## 2021-09-21 DIAGNOSIS — Z86010 Personal history of colonic polyps: Secondary | ICD-10-CM | POA: Insufficient documentation

## 2021-09-21 DIAGNOSIS — K5909 Other constipation: Secondary | ICD-10-CM

## 2021-09-21 DIAGNOSIS — Z1211 Encounter for screening for malignant neoplasm of colon: Secondary | ICD-10-CM

## 2021-09-21 MED ORDER — PEG 3350-KCL-NA BICARB-NACL 420 G PO SOLR: 4000.0000 mL | ORAL | 0 refills | Status: DC

## 2021-09-21 NOTE — Patient Instructions (Signed)
It was nice to meet you! ?We will get you set up for colonoscopy. ?I am also providing samples of linzess for your constipation, you can start these a few days after your colonoscopy if you prefer, you will take one daily in the morning prior to eating, can cause some abdominal discomfort and looser stools initially, this usually resolves. If you have good results, I can send you a prescription for this. ?Continue to stay well hydrated with plenty of water and diet high in fruits, veggies and whole grains. ? ?

## 2021-09-21 NOTE — H&P (View-Only) (Signed)
? ?Referring Provider: Celene Squibb, MD ?Primary Care Physician:  Celene Squibb, MD ?Primary GI Physician: new ? ?Chief Complaint  ?Patient presents with  ? Colonoscopy  ?  Discuss upcoming colonoscopy due to a procedure he had in the past.   ? ?HPI:   ?Brett Russell is a 76 y.o. male with past medical history of AAA, anxiety, COPD, GERD, gout, HTN, macular degeneration, parkinson's disease. ? ?Patient presenting today to schedule colonoscopy. ? ?He states that he had aortic aneurysm surgery in 2007 and that his intestines had to be removed and then put back into place during the surgery. He has concerns about colonoscopy due to this. He states he is amenable to proceeding with updating his colonoscopy but wants to make sure that whichever doctor is doing it, is aware of his history of this.  ? ?He states that he is having ongoing constipation that has been occurring since his AAA surgery. He reports that his constipation is severe at times. Last year he had to go to the ED as he had not had a BM in a while and was there for 3 days, initially suspected he had a bowel obstruction but was later determined he likely had some scar tissue present instead. States that he was started on miralax 1 capful/day and 2 senna daily by Dr. Carles Collet.  Sometimes these work and sometimes they do not and he requires ex-lax to help him go. He is also taking a probiotic which helps sometimes. Though states that his BMs are very unpredictable and sometimes he will end up having diarrhea depending on what he takes for his constipation. He will have some abdominal pain if he gets very constipated. Usually does not go longer than 2-3 days without having a BM. He will take ex-lax if this occurs.  Denies rectal bleeding, melena, does have some occasional nausea since having his gallbladder removed, PCP gave him '4mg'$  zofran that he uses PRN. Denies unintentional weight loss, though has has cut back on sugar and high cholesterol foods at request of  his PCP which has helped him shed a few pounds. ? ?NSAID EQA:STMHDQQ only ?Social IW:LNLGX 1PPD, no etoh ? ?Last Colonoscopy:09/20/18- One 8 to 12 mm polyp in the cecum,  ?- One small polyp in the ascending colon. Biopsied. ?- Twelve 4 to 7 mm polyps at the splenic flexure, in the transverse colon, at the hepatic ?flexure, in the ascending colon and in the cecum ?- One 10 mm polyp in the proximal sigmoid colon-all tubular adenomas ?- Diverticulosis in the sigmoid colon. ?- External and internal hemorrhoids. ?Last Endoscopy:? ? ?Recommendations:  ?Repeat colonoscopy in march 2023 ? ?Past Medical History:  ?Diagnosis Date  ? AAA (abdominal aortic aneurysm)   ? Allergy   ? takes Allegra daily  ? Anxiety   ? takes Xanax nightly  ? Aortic aneurysm ([place]) 2007  ? Arthritis   ? COPD (chronic obstructive pulmonary disease) ([place])   ? Enlarged prostate   ? slightly  ? GERD (gastroesophageal reflux disease)   ? takes Omeprazole daily  ? Gout   ? takes Allopurinol nightly  ? History of bronchitis   ? many yrs ago  ? History of colon polyps   ? benign  ? History of kidney stones   ? hx of  ? Hypertension   ? takes Amlodipine daily  ? Insomnia   ? takes Melatonin nightly  ? Macular degeneration   ? wet-right eye .Injection of AvAStin every 10  wks  ? Parkinson disease ([place]) 2006  ? takes Sinemet daily  ? Polio   ? as a baby and had to have a blood transfusion  ? ? ?Past Surgical History:  ?Procedure Laterality Date  ? ABDOMINAL AORTIC ANEURYSM REPAIR  2007  ? ABDOMINAL AORTIC ENDOVASCULAR STENT GRAFT Left 12/03/2016  ? Procedure: INSERTION OF LEFT COMMON  ILIAC STENT-LEFT INTERNAL ILIAC  ARTERY COILING;  Surgeon: Rosetta Posner, MD;  Location: [place];  Service: Vascular;  Laterality: Left;  ? APPENDECTOMY    ? CHOLECYSTECTOMY    ? COLONOSCOPY N/A 11/29/2012  ? Procedure: COLONOSCOPY;  Surgeon: Rogene Houston, MD;  [facility];  Service: Endoscopy;  Laterality: N/A;  1200-moved to [place] notified pt  ? COLONOSCOPY N/A  09/20/2018  ? Procedure: COLONOSCOPY;  Surgeon: Rogene Houston, MD;  [facility];  Service: Endoscopy;  Laterality: N/A;  2:25  ? CYST EXCISION N/A 01/07/2020  ? Procedure: excision of scrotal sebaceous cyst;  Surgeon: Cleon Gustin, MD;  [facility];  Service: Urology;  Laterality: N/A;  ? KIDNEY SURGERY  2007  ? Tumor removed  ? POLYPECTOMY  09/20/2018  ? Procedure: POLYPECTOMY;  Surgeon: Rogene Houston, MD;  [facility];  Service: Endoscopy;;  colon  ? Right knee    ? Cartilage removed at age 89  ? ? ?Current Outpatient Medications  ?Medication Sig Dispense Refill  ? acetaminophen (TYLENOL) 500 MG tablet Take 500-1,000 mg by mouth every 6 (six) hours as needed for moderate pain.     ? allopurinol (ZYLOPRIM) 300 MG tablet Take 300 mg by mouth at bedtime.     ? ALPRAZolam (XANAX) 1 MG tablet Take 0.5 mg by mouth 2 (two) times daily.     ? amLODipine (NORVASC) 10 MG tablet Take 10 mg by mouth at bedtime.     ? Bevacizumab (AVASTIN) 100 MG/4ML SOLN Inject into the vein.    ? carbidopa-levodopa (SINEMET IR) 25-100 MG per tablet Take 1 tablet by mouth in the morning and at bedtime.     ? fexofenadine (ALLEGRA) 180 MG tablet Take 180 mg by mouth daily as needed for allergies.     ? melatonin 5 MG TABS Take 5 mg by mouth at bedtime as needed (sleep).     ? montelukast (SINGULAIR) 10 MG tablet Take 10 mg by mouth daily.    ? nystatin (MYCOSTATIN) 100000 UNIT/ML suspension Take 5 mLs by mouth 4 (four) times daily.    ? omeprazole (PRILOSEC) 20 MG capsule Take 20 mg by mouth daily before breakfast.     ? ondansetron (ZOFRAN-ODT) 4 MG disintegrating tablet Take 4 mg by mouth 4 (four) times daily as needed for nausea or vomiting.    ? polyethylene glycol (MIRALAX / GLYCOLAX) 17 g packet Take 17 g by mouth daily.    ? senna (SENOKOT) 8.6 MG TABS tablet Take 1 tablet by mouth.    ? bacitracin 500 UNIT/GM ointment Apply 1 application topically 2 (two) times daily. (Patient not taking: Reported  on 07/03/2020) 15 g 0  ? clotrimazole-betamethasone (LOTRISONE) cream Apply 1 application topically 2 (two) times daily as needed (jock itch). (Patient not taking: Reported on 09/21/2021)    ? ?No current facility-administered medications for this visit.  ? ? ?Allergies as of 09/21/2021 - Review Complete 09/21/2021  ?Allergen Reaction Noted  ? Asa [aspirin] Other (See Comments) 10/27/2016  ? Morphine and related  10/02/2012  ? Influenza  virus vaccine  10/24/2019  ? ? ?Family History  ?Problem Relation Age of Onset  ? Kidney disease Father   ? Heart disease Father   ? AAA (abdominal aortic aneurysm) Father   ? Healthy Brother   ? Healthy Daughter   ? Constipation Daughter   ? Healthy Daughter   ? ? ?Social History  ? ?Socioeconomic History  ? Marital status: Divorced  ?  Spouse name: Not on file  ? Number of children: Not on file  ? Years of education: Not on file  ? Highest education level: Not on file  ?Occupational History  ? Not on file  ?Tobacco Use  ? Smoking status: Every Day  ?  Packs/day: 1.00  ?  Years: 49.00  ?  Pack years: 49.00  ?  Types: Cigarettes  ?  Start date: 08/11/1969  ? Smokeless tobacco: Never  ?Vaping Use  ? Vaping Use: Never used  ?Substance and Sexual Activity  ? Alcohol use: No  ?  Alcohol/week: 0.0 standard drinks  ? Drug use: No  ? Sexual activity: Not Currently  ?Other Topics Concern  ? Not on file  ?Social History Narrative  ? Not on file  ? ?Social Determinants of Health  ? ?Financial Resource Strain: Not on file  ?Food Insecurity: Not on file  ?Transportation Needs: Not on file  ?Physical Activity: Not on file  ?Stress: Not on file  ?Social Connections: Not on file  ? ?Review of systems ?General: negative for malaise, night sweats, fever, chills, weight loss ?Neck: Negative for lumps, goiter, pain and significant neck swelling ?Resp: Negative for cough, wheezing, dyspnea at rest ?CV: Negative for chest pain, leg swelling, palpitations, orthopnea ?GI: denies melena, hematochezia, nausea,  vomiting, diarrhea, dysphagia, odyonophagia, early satiety or unintentional weight loss. +constipation ?MSK: Negative for joint pain or swelling, back pain, and muscle pain. ?Derm: Negative for itching or

## 2021-09-21 NOTE — Telephone Encounter (Signed)
Gwyndolyn Guilford Ann Zaidee Rion, CMA  ?

## 2021-09-21 NOTE — Progress Notes (Signed)
Referring Provider: Celene Squibb, MD Primary Care Physician:  Celene Squibb, MD Primary GI Physician: new  Chief Complaint  Patient presents with   Colonoscopy    Discuss upcoming colonoscopy due to a procedure he had in the past.    HPI:   [name] is a 76 y.o. male with past medical history of AAA, anxiety, COPD, GERD, gout, HTN, macular degeneration, parkinson's disease.  Patient presenting today to schedule colonoscopy.  He states that he had aortic aneurysm surgery in 2007 and that his intestines had to be removed and then put back into place during the surgery. He has concerns about colonoscopy due to this. He states he is amenable to proceeding with updating his colonoscopy but wants to make sure that whichever doctor is doing it, is aware of his history of this.   He states that he is having ongoing constipation that has been occurring since his AAA surgery. He reports that his constipation is severe at times. Last year he had to go to the ED as he had not had a BM in a while and was there for 3 days, initially suspected he had a bowel obstruction but was later determined he likely had some scar tissue present instead. States that he was started on miralax 1 capful/day and 2 senna daily by Dr. Carles Collet.  Sometimes these work and sometimes they do not and he requires ex-lax to help him go. He is also taking a probiotic which helps sometimes. Though states that his BMs are very unpredictable and sometimes he will end up having diarrhea depending on what he takes for his constipation. He will have some abdominal pain if he gets very constipated. Usually does not go longer than 2-3 days without having a BM. He will take ex-lax if this occurs.  Denies rectal bleeding, melena, does have some occasional nausea since having his gallbladder removed, PCP gave him '4mg'$  zofran that he uses PRN. Denies unintentional weight loss, though has has cut back on sugar and high cholesterol foods at request of  his PCP which has helped him shed a few pounds.  NSAID JQB:HALPFXT only Social KW:IOXBD 1PPD, no etoh  Last Colonoscopy:09/20/18- One 8 to 12 mm polyp in the cecum,  - One small polyp in the ascending colon. Biopsied. - Twelve 4 to 7 mm polyps at the splenic flexure, in the transverse colon, at the hepatic flexure, in the ascending colon and in the cecum - One 10 mm polyp in the proximal sigmoid colon-all tubular adenomas - Diverticulosis in the sigmoid colon. - External and internal hemorrhoids. Last Endoscopy:?  Recommendations:  Repeat colonoscopy in march 2023  Past Medical History:  Diagnosis Date   AAA (abdominal aortic aneurysm)    Allergy    takes Allegra daily   Anxiety    takes Xanax nightly   Aortic aneurysm ([place]) 2007   Arthritis    COPD (chronic obstructive pulmonary disease) (HCC)    Enlarged prostate    slightly   GERD (gastroesophageal reflux disease)    takes Omeprazole daily   Gout    takes Allopurinol nightly   History of bronchitis    many yrs ago   History of colon polyps    benign   History of kidney stones    hx of   Hypertension    takes Amlodipine daily   Insomnia    takes Melatonin nightly   Macular degeneration    wet-right eye .Injection of AvAStin every 10  wks   Parkinson disease ([place]) 2006   takes Sinemet daily   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    Past Surgical History:  Procedure Laterality Date   ABDOMINAL AORTIC ANEURYSM REPAIR  2007   ABDOMINAL AORTIC ENDOVASCULAR STENT GRAFT Left 12/03/2016   Procedure: INSERTION OF LEFT COMMON  ILIAC STENT-LEFT INTERNAL ILIAC  ARTERY COILING;  Surgeon: Rosetta Posner, MD;  Location: [facility];  Service: Vascular;  Laterality: Left;   APPENDECTOMY     CHOLECYSTECTOMY     COLONOSCOPY N/A 11/29/2012   Procedure: COLONOSCOPY;  Surgeon: Rogene Houston, MD;  [facility];  Service: Endoscopy;  Laterality: N/A;  1200-moved to 101  Ann notified pt   COLONOSCOPY N/A  09/20/2018   Procedure: COLONOSCOPY;  Surgeon: Rogene Houston, MD;  Location: [facility];  Service: Endoscopy;  Laterality: N/A;  2:25   CYST EXCISION N/A 01/07/2020   Procedure: excision of scrotal sebaceous cyst;  Surgeon: Cleon Gustin, MD;  Location: [facility];  Service: Urology;  Laterality: N/A;   KIDNEY SURGERY  2007   Tumor removed   POLYPECTOMY  09/20/2018   Procedure: POLYPECTOMY;  Surgeon: Rogene Houston, MD;  [facility];  Service: Endoscopy;;  colon   Right knee     Cartilage removed at age 39    Current Outpatient Medications  Medication Sig Dispense Refill   acetaminophen (TYLENOL) 500 MG tablet Take 500-1,000 mg by mouth every 6 (six) hours as needed for moderate pain.      allopurinol (ZYLOPRIM) 300 MG tablet Take 300 mg by mouth at bedtime.      ALPRAZolam (XANAX) 1 MG tablet Take 0.5 mg by mouth 2 (two) times daily.      amLODipine (NORVASC) 10 MG tablet Take 10 mg by mouth at bedtime.      Bevacizumab (AVASTIN) 100 MG/4ML SOLN Inject into the vein.     carbidopa-levodopa (SINEMET IR) 25-100 MG per tablet Take 1 tablet by mouth in the morning and at bedtime.      fexofenadine (ALLEGRA) 180 MG tablet Take 180 mg by mouth daily as needed for allergies.      melatonin 5 MG TABS Take 5 mg by mouth at bedtime as needed (sleep).      montelukast (SINGULAIR) 10 MG tablet Take 10 mg by mouth daily.     nystatin (MYCOSTATIN) 100000 UNIT/ML suspension Take 5 mLs by mouth 4 (four) times daily.     omeprazole (PRILOSEC) 20 MG capsule Take 20 mg by mouth daily before breakfast.      ondansetron (ZOFRAN-ODT) 4 MG disintegrating tablet Take 4 mg by mouth 4 (four) times daily as needed for nausea or vomiting.     polyethylene glycol (MIRALAX / GLYCOLAX) 17 g packet Take 17 g by mouth daily.     senna (SENOKOT) 8.6 MG TABS tablet Take 1 tablet by mouth.     bacitracin 500 UNIT/GM ointment Apply 1 application topically 2 (two) times daily. (Patient not taking: Reported  on 07/03/2020) 15 g 0   clotrimazole-betamethasone (LOTRISONE) cream Apply 1 application topically 2 (two) times daily as needed (jock itch). (Patient not taking: Reported on 09/21/2021)     No current facility-administered medications for this visit.    Allergies as of 09/21/2021 - Review Complete 09/21/2021  Allergen Reaction Noted   Asa [aspirin] Other (See Comments) 10/27/2016   Morphine and related  10/02/2012   Influenza  virus vaccine  10/24/2019    Family History  Problem Relation Age of Onset   Kidney disease Father    Heart disease Father    AAA (abdominal aortic aneurysm) Father    Healthy Brother    Healthy Daughter    Constipation Daughter    Healthy Daughter     Social History   Socioeconomic History   Marital status: Divorced    Spouse name: Not on file   Number of children: Not on file   Years of education: Not on file   Highest education level: Not on file  Occupational History   Not on file  Tobacco Use   Smoking status: Every Day    Packs/day: 1.00    Years: 49.00    Pack years: 49.00    Types: Cigarettes    Start date: 08/11/1969   Smokeless tobacco: Never  Vaping Use   Vaping Use: Never used  Substance and Sexual Activity   Alcohol use: No    Alcohol/week: 0.0 standard drinks   Drug use: No   Sexual activity: Not Currently  Other Topics Concern   Not on file  Social History Narrative   Not on file   Social Determinants of Health   Financial Resource Strain: Not on file  Food Insecurity: Not on file  Transportation Needs: Not on file  Physical Activity: Not on file  Stress: Not on file  Social Connections: Not on file   Review of systems General: negative for malaise, night sweats, fever, chills, weight loss Neck: Negative for lumps, goiter, pain and significant neck swelling Resp: Negative for cough, wheezing, dyspnea at rest CV: Negative for chest pain, leg swelling, palpitations, orthopnea GI: denies melena, hematochezia, nausea,  vomiting, diarrhea, dysphagia, odyonophagia, early satiety or unintentional weight loss. +constipation MSK: Negative for joint pain or swelling, back pain, and muscle pain. Derm: Negative for itching or rash Psych: Denies depression, anxiety, memory loss, confusion. No homicidal or suicidal ideation.  Heme: Negative for prolonged bleeding, bruising easily, and swollen nodes. Endocrine: Negative for cold or heat intolerance, polyuria, polydipsia and goiter. Neuro: negative for tremor, gait imbalance, syncope and seizures. The remainder of the review of systems is noncontributory.  Physical Exam: BP 138/74 (BP Location: Right Arm, Patient Position: Sitting, Cuff Size: Normal)    Pulse (!) 101    Temp 97.9 F (36.6 C) (Oral)    Ht 5' 8.5" (1.74 m)    Wt 148 lb 9.6 oz (67.4 kg)    BMI 22.27 kg/m  General:   Alert and oriented. No distress noted. Pleasant and cooperative.  Head:  Normocephalic and atraumatic. Eyes:  Conjuctiva clear without scleral icterus. Mouth:  Oral mucosa pink and moist. Good dentition. No lesions. Heart: Normal rate and rhythm, s1 and s2 heart sounds present.  Lungs: Clear lung sounds in all lobes. Respirations equal and unlabored. Abdomen:  +BS, soft, non-tender and non-distended. No rebound or guarding. No HSM or masses noted. Multiple scars from previous surgeries. Reduceable Umbilical hernia.  Derm: No palmar erythema or jaundice Msk:  Symmetrical without gross deformities. Normal posture. Extremities:  Without edema. Neurologic:  Alert and  oriented x4 Psych:  Alert and cooperative. Normal mood and affect.  Invalid input(s): 6 MONTHS   ASSESSMENT: Brett Russell is a 76 y.o. male presenting today as new patient for surveillance colonoscopy.  Last colonoscopy was in 2020 with 14 polyps, all tubular adenomas. Pt requested OV prior to scheduling next colonoscopy as he has hx of AAA  repair in 2007 and reports that intestines had to be removed and then put back  during that surgery. He is concerned that this removal and replacement of intestines could cause potential issues when doing his colonoscopy. He wanted to make sure that we are aware of this and take any needed precautions during colonoscopy. I reassured patient that this is well documented in his chart and that safety precautions are always taken very seriously during any endoscopic procedure. Indications, risks and benefits of procedure discussed in detail with patient. Patient verbalized understanding and is in agreement to proceed with Colonoscopy at this time.   In regards to his constipation, this is a chronic issue since the AAA repair surgery, he is taking miralax 1 capful and 2 senna daily, sometimes has results from this regimen and other times he requires ex-lax if he has gone 2-3 days without a BM. He denies rectal bleeding or melena. I discussed trial of linzess with patient who is amenable, though he prefers to start this after his colonoscopy. I will provide samples of linzess 40mg for him, he can let me know if this works well and I can send Rx. He is aware that initiation of linzess can sometimes cause GI upset to include abdominal pain, nausea, bloating and looser stools, with typical resolution of these after 1-2 weeks of being on therapy.   No red flag symptoms. Patient denies melena, hematochezia, nausea, vomiting, diarrhea,  dysphagia, odyonophagia, early satiety or unintentional weight loss.   All questions were answered, patient verbalized understanding and is in agreement with plan as outline above.   PLAN:  Schedule surveillance colonoscopy-ENDO 3 2. Linzess samples 734m, Rx if this provides good results 3. Stay well hydrated, diet high in fruits, veggies and whole grains.   Follow Up: TBD  Dantrell Schertzer L. CaAlver SorrowMSN, APRN, AGNP-C Adult-Gerontology Nurse Practitioner [facility]

## 2021-09-22 ENCOUNTER — Encounter (INDEPENDENT_AMBULATORY_CARE_PROVIDER_SITE_OTHER): Payer: Self-pay

## 2021-10-06 ENCOUNTER — Other Ambulatory Visit (INDEPENDENT_AMBULATORY_CARE_PROVIDER_SITE_OTHER): Payer: Self-pay

## 2021-10-06 NOTE — Patient Instructions (Signed)
? ? ? ? ? ? Brett Russell ? 10/06/2021  ?  ? '@PREFPERIOPPHARMACY'$ @ ? ? Your procedure is scheduled on  10/13/2021. ? ? Report to [facility] at  11  A.M. ? ? Call this number if you have problems the morning of surgery: ? [phone] ? ? Remember: ?Follow the diet and prep instructions given to you by the office. ?  ? Take these medicines the morning of surgery with A SIP OF WATER  ? ?xanax(if needed), sinemet, singulair, prilosec, zofran (if needed). ? ?  ? Do not wear jewelry, make-up or nail polish. ? Do not wear lotions, powders, or perfumes, or deodorant. ? Do not shave 48 hours prior to surgery.  Men may shave face and neck. ? Do not bring valuables to the hospital. ? [name] is not responsible for any belongings or valuables. ? ?Contacts, dentures or bridgework may not be worn into surgery.  Leave your suitcase in the car.  After surgery it may be brought to your room. ? ?For patients admitted to the hospital, discharge time will be determined by your treatment team. ? ?Patients discharged the day of surgery will not be allowed to drive home and must have someone with them for 24 hours.  ? ? ?Special instructions:   DO NOT smoke tobacco or vape for 24 hours before your procedure. ? ?Please read over the following fact sheets that you were given. ?Anesthesia Post-op Instructions and Care and Recovery After Surgery ?  ? ? ? Colonoscopy, Adult, Care After ?This sheet gives you information about how to care for yourself after your procedure. Your health care provider may also give you more specific instructions. If you have problems or questions, contact your health care provider. ?What can I expect after the procedure? ?After the procedure, it is common to have: ?A small amount of blood in your stool for 24 hours after the procedure. ?Some gas. ?Mild cramping or bloating of your abdomen. ?Follow these instructions at home: ?Eating and drinking ? ?Drink enough fluid to keep your urine pale yellow. ?Follow  instructions from your health care provider about eating or drinking restrictions. ?Resume your normal diet as instructed by your health care provider. Avoid heavy or fried foods that are hard to digest. ?Activity ?Rest as told by your health care provider. ?Avoid sitting for a long time without moving. Get up to take short walks every 1-2 hours. This is important to improve blood flow and breathing. Ask for help if you feel weak or unsteady. ?Return to your normal activities as told by your health care provider. Ask your health care provider what activities are safe for you. ?Managing cramping and bloating ? ?Try walking around when you have cramps or feel bloated. ?Apply heat to your abdomen as told by your health care provider. Use the heat source that your health care provider recommends, such as a moist heat pack or a heating pad. ?Place a towel between your skin and the heat source. ?Leave the heat on for 20-30 minutes. ?Remove the heat if your skin turns bright red. This is especially important if you are unable to feel pain, heat, or cold. You may have a greater risk of getting burned. ?General instructions ?If you were given a sedative during the procedure, it can affect you for several hours. Do not drive or operate machinery until your health care provider says that it is safe. ?For the first 24 hours after the procedure: ?Do not sign important documents. ?  Do not drink alcohol. ?Do your regular daily activities at a slower pace than normal. ?Eat soft foods that are easy to digest. ?Take over-the-counter and prescription medicines only as told by your health care provider. ?Keep all follow-up visits as told by your health care provider. This is important. ?Contact a health care provider if: ?You have blood in your stool 2-3 days after the procedure. ?Get help right away if you have: ?More than a small spotting of blood in your stool. ?Large blood clots in your stool. ?Swelling of your abdomen. ?Nausea or  vomiting. ?A fever. ?Increasing pain in your abdomen that is not relieved with medicine. ?Summary ?After the procedure, it is common to have a small amount of blood in your stool. You may also have mild cramping and bloating of your abdomen. ?If you were given a sedative during the procedure, it can affect you for several hours. Do not drive or operate machinery until your health care provider says that it is safe. ?Get help right away if you have a lot of blood in your stool, nausea or vomiting, a fever, or increased pain in your abdomen. ?This information is not intended to replace advice given to you by your health care provider. Make sure you discuss any questions you have with your health care provider. ?Document Revised: 05/11/2019 Document Reviewed: 01/29/2019 ?Elsevier Patient Education ? [name]. ?Monitored Anesthesia Care, Care After ?This sheet gives you information about how to care for yourself after your procedure. Your health care provider may also give you more specific instructions. If you have problems or questions, contact your health care provider. ?What can I expect after the procedure? ?After the procedure, it is common to have: ?Tiredness. ?Forgetfulness about what happened after the procedure. ?Impaired judgment for important decisions. ?Nausea or vomiting. ?Some difficulty with balance. ?Follow these instructions at home: ?For the time period you were told by your health care provider: ?  ?Rest as needed. ?Do not participate in activities where you could fall or become injured. ?Do not drive or use machinery. ?Do not drink alcohol. ?Do not take sleeping pills or medicines that cause drowsiness. ?Do not make important decisions or sign legal documents. ?Do not take care of children on your own. ?Eating and drinking ?Follow the diet that is recommended by your health care provider. ?Drink enough fluid to keep your urine pale yellow. ?If you vomit: ?Drink water, juice, or soup when  you can drink without vomiting. ?Make sure you have little or no nausea before eating solid foods. ?General instructions ?Have a responsible adult stay with you for the time you are told. It is important to have someone help care for you until you are awake and alert. ?Take over-the-counter and prescription medicines only as told by your health care provider. ?If you have sleep apnea, surgery and certain medicines can increase your risk for breathing problems. Follow instructions from your health care provider about wearing your sleep device: ?Anytime you are sleeping, including during daytime naps. ?While taking prescription pain medicines, sleeping medicines, or medicines that make you drowsy. ?Avoid smoking. ?Keep all follow-up visits as told by your health care provider. This is important. ?Contact a health care provider if: ?You keep feeling nauseous or you keep vomiting. ?You feel light-headed. ?You are still sleepy or having trouble with balance after 24 hours. ?You develop a rash. ?You have a fever. ?You have redness or swelling around the IV site. ?Get help right away if: ?You have trouble breathing. ?  You have new-onset confusion at home. ?Summary ?For several hours after your procedure, you may feel tired. You may also be forgetful and have poor judgment. ?Have a responsible adult stay with you for the time you are told. It is important to have someone help care for you until you are awake and alert. ?Rest as told. Do not drive or operate machinery. Do not drink alcohol or take sleeping pills. ?Get help right away if you have trouble breathing, or if you suddenly become confused. ?This information is not intended to replace advice given to you by your health care provider. Make sure you discuss any questions you have with your health care provider. ?Document Revised: 03/20/2020 Document Reviewed: 06/07/2019 ?Elsevier Patient Education ? [place]. ? ?

## 2021-10-08 ENCOUNTER — Encounter (HOSPITAL_COMMUNITY)
Admission: RE | Admit: 2021-10-08 | Discharge: 2021-10-08 | Disposition: A | Discharge status: Home / Self Care | Payer: Medicare HMO | Source: Ambulatory Visit | Attending: Gastroenterology | Admitting: Gastroenterology

## 2021-10-08 DIAGNOSIS — Z01810 Encounter for preprocedural cardiovascular examination: Secondary | ICD-10-CM | POA: Diagnosis not present

## 2021-10-08 DIAGNOSIS — Z1211 Encounter for screening for malignant neoplasm of colon: Secondary | ICD-10-CM

## 2021-10-13 ENCOUNTER — Encounter (HOSPITAL_COMMUNITY)
Admission: RE | Disposition: A | Discharge status: Home / Self Care | Payer: Self-pay | Source: Home / Self Care | Attending: Gastroenterology

## 2021-10-13 ENCOUNTER — Ambulatory Visit (HOSPITAL_COMMUNITY)
Admission: RE | Admit: 2021-10-13 | Discharge: 2021-10-13 | Disposition: A | Discharge status: Home / Self Care | Payer: Medicare HMO | Attending: Gastroenterology | Admitting: Gastroenterology

## 2021-10-13 ENCOUNTER — Ambulatory Visit (HOSPITAL_COMMUNITY): Payer: Medicare HMO | Admitting: Anesthesiology

## 2021-10-13 ENCOUNTER — Ambulatory Visit (HOSPITAL_BASED_OUTPATIENT_CLINIC_OR_DEPARTMENT_OTHER): Payer: Medicare HMO | Admitting: Anesthesiology

## 2021-10-13 ENCOUNTER — Encounter (HOSPITAL_COMMUNITY): Payer: Self-pay | Admitting: Gastroenterology

## 2021-10-13 DIAGNOSIS — D123 Benign neoplasm of transverse colon: Secondary | ICD-10-CM | POA: Insufficient documentation

## 2021-10-13 DIAGNOSIS — Z79899 Other long term (current) drug therapy: Secondary | ICD-10-CM | POA: Insufficient documentation

## 2021-10-13 DIAGNOSIS — J449 Chronic obstructive pulmonary disease, unspecified: Secondary | ICD-10-CM | POA: Insufficient documentation

## 2021-10-13 DIAGNOSIS — Z1211 Encounter for screening for malignant neoplasm of colon: Secondary | ICD-10-CM | POA: Diagnosis not present

## 2021-10-13 DIAGNOSIS — K648 Other hemorrhoids: Secondary | ICD-10-CM | POA: Diagnosis not present

## 2021-10-13 DIAGNOSIS — K644 Residual hemorrhoidal skin tags: Secondary | ICD-10-CM | POA: Diagnosis not present

## 2021-10-13 DIAGNOSIS — K635 Polyp of colon: Secondary | ICD-10-CM | POA: Diagnosis not present

## 2021-10-13 DIAGNOSIS — Z09 Encounter for follow-up examination after completed treatment for conditions other than malignant neoplasm: Secondary | ICD-10-CM

## 2021-10-13 DIAGNOSIS — K5730 Diverticulosis of large intestine without perforation or abscess without bleeding: Secondary | ICD-10-CM | POA: Insufficient documentation

## 2021-10-13 DIAGNOSIS — I10 Essential (primary) hypertension: Secondary | ICD-10-CM | POA: Insufficient documentation

## 2021-10-13 DIAGNOSIS — G20 Parkinson's disease: Secondary | ICD-10-CM | POA: Diagnosis not present

## 2021-10-13 DIAGNOSIS — D175 Benign lipomatous neoplasm of intra-abdominal organs: Secondary | ICD-10-CM | POA: Diagnosis not present

## 2021-10-13 DIAGNOSIS — K219 Gastro-esophageal reflux disease without esophagitis: Secondary | ICD-10-CM | POA: Diagnosis not present

## 2021-10-13 DIAGNOSIS — F17210 Nicotine dependence, cigarettes, uncomplicated: Secondary | ICD-10-CM | POA: Diagnosis not present

## 2021-10-13 DIAGNOSIS — F419 Anxiety disorder, unspecified: Secondary | ICD-10-CM | POA: Insufficient documentation

## 2021-10-13 DIAGNOSIS — D122 Benign neoplasm of ascending colon: Secondary | ICD-10-CM | POA: Insufficient documentation

## 2021-10-13 DIAGNOSIS — Z86010 Personal history of colonic polyps: Secondary | ICD-10-CM | POA: Diagnosis not present

## 2021-10-13 DIAGNOSIS — D120 Benign neoplasm of cecum: Secondary | ICD-10-CM | POA: Insufficient documentation

## 2021-10-13 HISTORY — PX: COLONOSCOPY WITH PROPOFOL: SHX5780

## 2021-10-13 HISTORY — PX: POLYPECTOMY: SHX149

## 2021-10-13 LAB — HM COLONOSCOPY

## 2021-10-13 SURGERY — COLONOSCOPY WITH PROPOFOL
Anesthesia: General

## 2021-10-13 MED ORDER — LACTATED RINGERS IV SOLN: INTRAVENOUS | Status: DC | PRN

## 2021-10-13 MED ORDER — PROPOFOL 10 MG/ML IV BOLUS
INTRAVENOUS | Status: DC | PRN
  Administered 2021-10-13: 100 mg via INTRAVENOUS

## 2021-10-13 MED ORDER — PROPOFOL 500 MG/50ML IV EMUL
INTRAVENOUS | Status: DC | PRN
  Administered 2021-10-13: 150 ug/kg/min via INTRAVENOUS

## 2021-10-13 NOTE — Discharge Instructions (Addendum)
You are being discharged to home.  Resume your previous diet.  We are waiting for your pathology results.  Your physician has recommended a repeat colonoscopy in three years for surveillance.  

## 2021-10-13 NOTE — Interval H&P Note (Signed)
History and Physical Interval Note: ? ?10/13/2021 ?11:01 AM ? ?Brett Russell  has presented today for surgery, with the diagnosis of hx of polyps.  The various methods of treatment have been discussed with the patient and family. After consideration of risks, benefits and other options for treatment, the patient has consented to  Procedure(s) with comments: ?COLONOSCOPY WITH PROPOFOL (N/A) - 115 as a surgical intervention.  The patient's history has been reviewed, patient examined, no change in status, stable for surgery.  I have reviewed the patient's chart and labs.  Questions were answered to the patient's satisfaction.   ? ? ?[name] ? ? ?

## 2021-10-13 NOTE — Anesthesia Preprocedure Evaluation (Signed)
Anesthesia Evaluation  ?Patient identified by MRN, date of birth, ID band ?Patient awake ? ? ? ?Reviewed: ?Allergy & Precautions, H&P , NPO status , Patient's Chart, lab work & pertinent test results, reviewed documented beta blocker date and time  ? ?Airway ?Mallampati: II ? ?TM Distance: >3 FB ?Neck ROM: full ? ? ? Dental ?no notable dental hx. ? ?  ?Pulmonary ?COPD, Current Smoker,  ?  ?Pulmonary exam normal ?breath sounds clear to auscultation ? ? ? ? ? ? Cardiovascular ?Exercise Tolerance: Good ?hypertension, negative cardio ROS ? ? ?Rhythm:regular Rate:Normal ? ? ?  ?Neuro/Psych ?PSYCHIATRIC DISORDERS Anxiety negative neurological ROS ?   ? GI/Hepatic ?Neg liver ROS, GERD  Medicated,  ?Endo/Other  ?negative endocrine ROS ? Renal/GU ?negative Renal ROS  ?negative genitourinary ?  ?Musculoskeletal ? ? Abdominal ?  ?Peds ? Hematology ?negative hematology ROS ?(+)   ?Anesthesia Other Findings ? ? Reproductive/Obstetrics ?negative OB ROS ? ?  ? ? ? ? ? ? ? ? ? ? ? ? ? ?  ?  ? ? ? ? ? ? ? ? ?Anesthesia Physical ?Anesthesia Plan ? ?ASA: 3 ? ?Anesthesia Plan: General  ? ?Post-op Pain Management:   ? ?Induction:  ? ?PONV Risk Score and Plan: Propofol infusion ? ?Airway Management Planned:  ? ?Additional Equipment:  ? ?Intra-op Plan:  ? ?Post-operative Plan:  ? ?Informed Consent: I have reviewed the patients History and Physical, chart, labs and discussed the procedure including the risks, benefits and alternatives for the proposed anesthesia with the patient or authorized representative who has indicated his/her understanding and acceptance.  ? ? ? ?Dental Advisory Given ? ?Plan Discussed with: CRNA ? ?Anesthesia Plan Comments:   ? ? ? ? ? ? ?Anesthesia Quick Evaluation ? ?

## 2021-10-13 NOTE — Op Note (Addendum)
[facility] ?Patient Name: Brett Russell ?Procedure Date: 10/13/2021 11:27 AM ?MRN: [id number] ?Date of Birth: 08-Jul-1946 ?Attending MD: Maylon Peppers ,  ?CSN: [id number] ?Age: 76 ?Admit Type: Outpatient ?Procedure:                Colonoscopy ?Indications:              Surveillance: Personal history of colonic polyps  ?                          (unknown histology) on last colonoscopy more than 3  ?                          years ago ?Providers:                Maylon Peppers, Crystal Page, Randa Spike,  ?                          Technician ?Referring MD:              ?Medicines:                Monitored Anesthesia Care ?Complications:            No immediate complications. ?Estimated Blood Loss:     Estimated blood loss: none. ?Procedure:                Pre-Anesthesia Assessment: ?                          - Prior to the procedure, a History and Physical  ?                          was performed, and patient medications, allergies  ?                          and sensitivities were reviewed. The patient's  ?                          tolerance of previous anesthesia was reviewed. ?                          - The risks and benefits of the procedure and the  ?                          sedation options and risks were discussed with the  ?                          patient. All questions were answered and informed  ?                          consent was obtained. ?                          - ASA Grade Assessment: III - A patient with severe  ?                          systemic disease. ?  After obtaining informed consent, the colonoscope  ?                          was passed under direct vision. Throughout the  ?                          procedure, the patient's blood pressure, pulse, and  ?                          oxygen saturations were monitored continuously. The  ?                          PCF-HQ190L ([id number]) scope was introduced through  ?                          the anus and  advanced to the the cecum, identified  ?                          by appendiceal orifice and ileocecal valve. The  ?                          colonoscopy was performed without difficulty. The  ?                          patient tolerated the procedure well. The quality  ?                          of the bowel preparation was adequate. ?Scope In: 11:35:33 AM ?Scope Out: 12:09:57 PM ?Scope Withdrawal Time: 0 hours 30 minutes 37 seconds  ?Total Procedure Duration: 0 hours 34 minutes 24 seconds  ?Findings: ?     Hemorrhoids were found on perianal exam. ?     Nine sessile polyps were found in the transverse colon, ascending colon  ?     and cecum. The polyps were 3 to 8 mm in size. These polyps were removed  ?     with a cold snare. Resection and retrieval were complete. ?     There was a medium-sized lipoma, in the descending colon. ?     Multiple small and large-mouthed diverticula were found in the sigmoid  ?     colon. ?     Non-bleeding external internal hemorrhoids were found during  ?     retroflexion. The hemorrhoids were small. ?Impression:               - Hemorrhoids found on perianal exam. ?                          - Nine 3 to 8 mm polyps in the transverse colon, in  ?                          the ascending colon and in the cecum, removed with  ?                          a cold snare. Resected and retrieved. ?                          -  Medium-sized lipoma in the descending colon. ?                          - Diverticulosis in the sigmoid colon. ?                          - Non-bleeding external internal hemorrhoids. ?Moderate Sedation: ?     Per Anesthesia Care ?Recommendation:           - Discharge patient to home (ambulatory). ?                          - Resume previous diet. ?                          - Await pathology results. ?                          - Repeat colonoscopy in 3 years for surveillance. ?Procedure Code(s):        --- Professional --- ?                          [phone], Colonoscopy,  flexible; with removal of  ?                          tumor(s), polyp(s), or other lesion(s) by snare  ?                          technique ?Diagnosis Code(s):        --- Professional --- ?                          Z86.010, Personal history of colonic polyps ?                          K64.4, Residual hemorrhoidal skin tags ?                          K64.8, Other hemorrhoids ?                          K63.5, Polyp of colon ?                          D17.5, Benign lipomatous neoplasm of  ?                          intra-abdominal organs ?                          K57.30, Diverticulosis of large intestine without  ?                          perforation or abscess without bleeding ?CPT copyright 2019 [employer]. All rights reserved. ?The codes documented in this report are preliminary and upon coder review may  ?be revised to meet current compliance requirements. ?Maylon Peppers, MD ?Maylon Peppers,  ?10/13/2021 12:18:13 PM ?This report has been signed electronically. ?  Number of Addenda: 0 ?

## 2021-10-13 NOTE — Transfer of Care (Signed)
Immediate Anesthesia Transfer of Care Note ? ?Patient: Brett Russell ? ?Procedure(s) Performed: COLONOSCOPY WITH PROPOFOL ?POLYPECTOMY INTESTINAL ? ?Patient Location: PACU ? ?Anesthesia Type:General ? ?Level of Consciousness: awake, alert  and patient cooperative ? ?Airway & Oxygen Therapy: Patient Spontanous Breathing ? ?Post-op Assessment: Report given to RN, Post -op Vital signs reviewed and stable and Patient moving all extremities X 4 ? ?Post vital signs: Reviewed and stable ? ?Last Vitals:  ?Vitals Value Taken Time  ?BP    ?Temp    ?Pulse    ?Resp    ?SpO2    ? ? ?Last Pain:  ?Vitals:  ? 10/13/21 1127  ?TempSrc:   ?PainSc: 0-No pain  ?   ? ?  ? ?Complications: No notable events documented. ?

## 2021-10-13 NOTE — Anesthesia Postprocedure Evaluation (Signed)
Anesthesia Post Note ? ?Patient: RITO LECOMTE ? ?Procedure(s) Performed: COLONOSCOPY WITH PROPOFOL ?POLYPECTOMY INTESTINAL ? ?Patient location during evaluation: Phase II ?Anesthesia Type: General ?Level of consciousness: awake ?Pain management: pain level controlled ?Vital Signs Assessment: post-procedure vital signs reviewed and stable ?Respiratory status: spontaneous breathing and respiratory function stable ?Cardiovascular status: blood pressure returned to baseline and stable ?Postop Assessment: no headache and no apparent nausea or vomiting ?Anesthetic complications: no ?Comments: Late entry ? ? ?No notable events documented. ? ? ?Last Vitals:  ?Vitals:  ? 10/13/21 1234 10/13/21 1237  ?BP:  93/61  ?Pulse: 91   ?Resp: 19   ?Temp: 36.8 ?C   ?SpO2: 98%   ?  ?Last Pain:  ?Vitals:  ? 10/13/21 1234  ?TempSrc: Oral  ?PainSc:   ? ? ?  ?  ?  ?  ?  ?  ? ?Louann Sjogren ? ? ? ? ?

## 2021-10-14 ENCOUNTER — Encounter (INDEPENDENT_AMBULATORY_CARE_PROVIDER_SITE_OTHER): Payer: Self-pay | Admitting: *Deleted

## 2021-10-14 LAB — SURGICAL PATHOLOGY

## 2021-10-16 ENCOUNTER — Encounter (HOSPITAL_COMMUNITY): Payer: Self-pay | Admitting: Gastroenterology

## 2021-11-23 DIAGNOSIS — Z125 Encounter for screening for malignant neoplasm of prostate: Secondary | ICD-10-CM | POA: Diagnosis not present

## 2021-11-23 DIAGNOSIS — E782 Mixed hyperlipidemia: Secondary | ICD-10-CM | POA: Diagnosis not present

## 2021-11-23 DIAGNOSIS — Z85528 Personal history of other malignant neoplasm of kidney: Secondary | ICD-10-CM | POA: Diagnosis not present

## 2021-11-23 DIAGNOSIS — R7301 Impaired fasting glucose: Secondary | ICD-10-CM | POA: Diagnosis not present

## 2021-11-30 DIAGNOSIS — G20 Parkinson's disease: Secondary | ICD-10-CM | POA: Diagnosis not present

## 2021-11-30 DIAGNOSIS — K219 Gastro-esophageal reflux disease without esophagitis: Secondary | ICD-10-CM | POA: Diagnosis not present

## 2021-11-30 DIAGNOSIS — K5669 Other partial intestinal obstruction: Secondary | ICD-10-CM | POA: Diagnosis not present

## 2021-11-30 DIAGNOSIS — M109 Gout, unspecified: Secondary | ICD-10-CM | POA: Diagnosis not present

## 2021-11-30 DIAGNOSIS — R7301 Impaired fasting glucose: Secondary | ICD-10-CM | POA: Diagnosis not present

## 2021-11-30 DIAGNOSIS — I10 Essential (primary) hypertension: Secondary | ICD-10-CM | POA: Diagnosis not present

## 2021-11-30 DIAGNOSIS — E782 Mixed hyperlipidemia: Secondary | ICD-10-CM | POA: Diagnosis not present

## 2021-11-30 DIAGNOSIS — R269 Unspecified abnormalities of gait and mobility: Secondary | ICD-10-CM | POA: Diagnosis not present

## 2021-11-30 DIAGNOSIS — J449 Chronic obstructive pulmonary disease, unspecified: Secondary | ICD-10-CM | POA: Diagnosis not present

## 2021-12-31 DIAGNOSIS — H43393 Other vitreous opacities, bilateral: Secondary | ICD-10-CM | POA: Diagnosis not present

## 2021-12-31 DIAGNOSIS — H353122 Nonexudative age-related macular degeneration, left eye, intermediate dry stage: Secondary | ICD-10-CM | POA: Diagnosis not present

## 2021-12-31 DIAGNOSIS — H353211 Exudative age-related macular degeneration, right eye, with active choroidal neovascularization: Secondary | ICD-10-CM | POA: Diagnosis not present

## 2021-12-31 DIAGNOSIS — H43813 Vitreous degeneration, bilateral: Secondary | ICD-10-CM | POA: Diagnosis not present

## 2022-03-09 DIAGNOSIS — K5904 Chronic idiopathic constipation: Secondary | ICD-10-CM | POA: Diagnosis not present

## 2022-03-09 DIAGNOSIS — X58XXXA Exposure to other specified factors, initial encounter: Secondary | ICD-10-CM | POA: Diagnosis not present

## 2022-04-22 DIAGNOSIS — H353211 Exudative age-related macular degeneration, right eye, with active choroidal neovascularization: Secondary | ICD-10-CM | POA: Diagnosis not present

## 2022-04-22 DIAGNOSIS — H353122 Nonexudative age-related macular degeneration, left eye, intermediate dry stage: Secondary | ICD-10-CM | POA: Diagnosis not present

## 2022-04-22 DIAGNOSIS — H35372 Puckering of macula, left eye: Secondary | ICD-10-CM | POA: Diagnosis not present

## 2022-04-22 DIAGNOSIS — H43813 Vitreous degeneration, bilateral: Secondary | ICD-10-CM | POA: Diagnosis not present

## 2022-05-26 DIAGNOSIS — R7301 Impaired fasting glucose: Secondary | ICD-10-CM | POA: Diagnosis not present

## 2022-05-26 DIAGNOSIS — E782 Mixed hyperlipidemia: Secondary | ICD-10-CM | POA: Diagnosis not present

## 2022-05-26 DIAGNOSIS — Z85528 Personal history of other malignant neoplasm of kidney: Secondary | ICD-10-CM | POA: Diagnosis not present

## 2022-06-02 DIAGNOSIS — M109 Gout, unspecified: Secondary | ICD-10-CM | POA: Diagnosis not present

## 2022-06-02 DIAGNOSIS — J449 Chronic obstructive pulmonary disease, unspecified: Secondary | ICD-10-CM | POA: Diagnosis not present

## 2022-06-02 DIAGNOSIS — R269 Unspecified abnormalities of gait and mobility: Secondary | ICD-10-CM | POA: Diagnosis not present

## 2022-06-02 DIAGNOSIS — K5669 Other partial intestinal obstruction: Secondary | ICD-10-CM | POA: Diagnosis not present

## 2022-06-02 DIAGNOSIS — Z23 Encounter for immunization: Secondary | ICD-10-CM | POA: Diagnosis not present

## 2022-06-02 DIAGNOSIS — I10 Essential (primary) hypertension: Secondary | ICD-10-CM | POA: Diagnosis not present

## 2022-06-02 DIAGNOSIS — E782 Mixed hyperlipidemia: Secondary | ICD-10-CM | POA: Diagnosis not present

## 2022-06-02 DIAGNOSIS — G20A1 Parkinson's disease without dyskinesia, without mention of fluctuations: Secondary | ICD-10-CM | POA: Diagnosis not present

## 2022-06-02 DIAGNOSIS — I700 Atherosclerosis of aorta: Secondary | ICD-10-CM | POA: Diagnosis not present

## 2022-06-02 DIAGNOSIS — R7301 Impaired fasting glucose: Secondary | ICD-10-CM | POA: Diagnosis not present

## 2022-06-30 ENCOUNTER — Ambulatory Visit (HOSPITAL_COMMUNITY)
Admission: RE | Admit: 2022-06-30 | Discharge: 2022-06-30 | Disposition: A | Discharge status: Home / Self Care | Payer: Medicare HMO | Source: Ambulatory Visit | Attending: Urology | Admitting: Urology

## 2022-06-30 ENCOUNTER — Ambulatory Visit: Payer: Medicare Other | Admitting: Urology

## 2022-06-30 DIAGNOSIS — N2889 Other specified disorders of kidney and ureter: Secondary | ICD-10-CM | POA: Insufficient documentation

## 2022-06-30 DIAGNOSIS — N281 Cyst of kidney, acquired: Secondary | ICD-10-CM | POA: Diagnosis not present

## 2022-07-21 ENCOUNTER — Ambulatory Visit (INDEPENDENT_AMBULATORY_CARE_PROVIDER_SITE_OTHER): Payer: Medicare HMO | Admitting: Urology

## 2022-07-21 VITALS — BP 137/77 | HR 98

## 2022-07-21 DIAGNOSIS — N401 Enlarged prostate with lower urinary tract symptoms: Secondary | ICD-10-CM | POA: Diagnosis not present

## 2022-07-21 DIAGNOSIS — N138 Other obstructive and reflux uropathy: Secondary | ICD-10-CM

## 2022-07-21 DIAGNOSIS — N2889 Other specified disorders of kidney and ureter: Secondary | ICD-10-CM

## 2022-07-21 DIAGNOSIS — R351 Nocturia: Secondary | ICD-10-CM | POA: Diagnosis not present

## 2022-07-21 NOTE — Patient Instructions (Signed)

## 2022-07-21 NOTE — Progress Notes (Signed)
07/21/2022 3:14 PM   Brett Russell October 09, 1945 [id number]  Referring provider: Celene Squibb, MD [address],   [id number]  Followup renal cyst   HPI: Mr Brett Russell is a 77yo here for followup for renal cysts and nephrolithiasis. He denies any stone events since last visit.  Renal US shows stable right renal cysts. He denies any flank pain. PSA 0.9. IPSS 2 QOL 0 on no BPH therapy. No other complaints today  PMH: Past Medical History:  Diagnosis Date   AAA (abdominal aortic aneurysm)    Allergy    takes Allegra daily   Anxiety    takes Xanax nightly   Aortic aneurysm ([place]) 2007   Arthritis    COPD (chronic obstructive pulmonary disease) (HCC)    Enlarged prostate    slightly   GERD (gastroesophageal reflux disease)    takes Omeprazole daily   Gout    takes Allopurinol nightly   History of bronchitis    many yrs ago   History of colon polyps    benign   History of kidney stones    hx of   Hypertension    takes Amlodipine daily   Insomnia    takes Melatonin nightly   Macular degeneration    wet-right eye .Injection of AvAStin every 10 wks   Parkinson disease ([place]) 2006   takes Sinemet daily   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    Surgical History: Past Surgical History:  Procedure Laterality Date   ABDOMINAL AORTIC ANEURYSM REPAIR  2007   ABDOMINAL AORTIC ENDOVASCULAR STENT GRAFT Left 12/03/2016   Procedure: INSERTION OF LEFT COMMON  ILIAC STENT-LEFT INTERNAL ILIAC  ARTERY COILING;  Surgeon: Rosetta Posner, MD;  Location: [place];  Service: Vascular;  Laterality: Left;   APPENDECTOMY     CHOLECYSTECTOMY     COLONOSCOPY N/A 11/29/2012   Procedure: COLONOSCOPY;  Surgeon: Rogene Houston, MD;  [facility];  Service: Endoscopy;  Laterality: N/A;  1200-moved to 52  Ann notified pt   COLONOSCOPY N/A 09/20/2018   Procedure: COLONOSCOPY;  Surgeon: Rogene Houston, MD;  [facility]: [facility];  Service: Endoscopy;   Laterality: N/A;  2:25   COLONOSCOPY WITH PROPOFOL N/A 10/13/2021   Procedure: COLONOSCOPY WITH PROPOFOL;  Surgeon: Harvel Quale, MD;  [facility];  Service: Gastroenterology;  Laterality: N/A;  115   CYST EXCISION N/A 01/07/2020   Procedure: excision of scrotal sebaceous cyst;  Surgeon: Cleon Gustin, MD;  Location: [facility];  Service: Urology;  Laterality: N/A;   KIDNEY SURGERY  2007   Tumor removed   POLYPECTOMY  09/20/2018   Procedure: POLYPECTOMY;  Surgeon: Rogene Houston, MD;  [facility];  Service: Endoscopy;;  colon   POLYPECTOMY  10/13/2021   Procedure: POLYPECTOMY INTESTINAL;  Surgeon: Harvel Quale, MD;  [facility];  Service: Gastroenterology;;   Right knee     Cartilage removed at age 49    Home Medications:  Allergies as of 07/21/2022       Reactions   Asa [aspirin] Other (See Comments)   Severe nosebleeds in the past.    Morphine And Related    Hallucinations/attempt to elope from the hospital   Influenza Virus Vaccine    Rash         Medication List        Accurate as of July 21, 2022  3:14 PM. If you have any questions, ask your nurse or doctor.          acetaminophen 500 MG tablet Commonly known as: TYLENOL Take 500-1,000 mg by mouth every 6 (six) hours as needed for moderate pain.   allopurinol 300 MG tablet Commonly known as: ZYLOPRIM Take 300 mg by mouth at bedtime.   ALPRAZolam 1 MG tablet Commonly known as: XANAX Take 0.5 mg by mouth 2 (two) times daily.   amLODipine 10 MG tablet Commonly known as: NORVASC Take 10 mg by mouth at bedtime.   atorvastatin 10 MG tablet Commonly known as: LIPITOR Take 10 mg by mouth daily.   Avastin 100 MG/4ML Soln Generic drug: Bevacizumab Inject 100 mg into the vein as directed. Getting every 4 months   carbidopa-levodopa 25-100 MG tablet Commonly known as: SINEMET IR Take 1 tablet by mouth in the morning and at bedtime.   fexofenadine  180 MG tablet Commonly known as: ALLEGRA Take 180 mg by mouth daily as needed for allergies.   melatonin 5 MG Tabs Take 5 mg by mouth at bedtime as needed (sleep).   montelukast 10 MG tablet Commonly known as: SINGULAIR Take 10 mg by mouth daily.   omeprazole 20 MG capsule Commonly known as: PRILOSEC Take 20 mg by mouth daily before breakfast.   ondansetron 4 MG disintegrating tablet Commonly known as: ZOFRAN-ODT Take 4 mg by mouth 4 (four) times daily as needed for nausea or vomiting.   polyethylene glycol 17 g packet Commonly known as: MIRALAX / GLYCOLAX Take 17 g by mouth daily as needed for mild constipation.   senna 8.6 MG Tabs tablet Commonly known as: SENOKOT Take 2 tablets by mouth daily as needed for mild constipation.   sodium chloride 0.65 % Soln nasal spray Commonly known as: OCEAN Place 1 spray into both nostrils daily as needed for congestion.        Allergies:  Allergies  Allergen Reactions   Asa [Aspirin] Other (See Comments)    Severe nosebleeds in the past.    Morphine And Related     Hallucinations/attempt to elope from the hospital   Influenza Virus Vaccine     Rash     Family History: Family History  Problem Relation Age of Onset   Kidney disease Father    Heart disease Father    AAA (abdominal aortic aneurysm) Father    Healthy Brother    Healthy Daughter    Constipation Daughter    Healthy Daughter     Social History:  reports that he has been smoking cigarettes. He started smoking about 52 years ago. He has a 49.00 pack-year smoking history. He has never used smokeless tobacco. He reports that he does not drink alcohol and does not use drugs.  ROS: All other review of systems were reviewed and are negative except what is noted above in HPI  Physical Exam: BP 137/77   Pulse 98   Constitutional:  Alert and oriented, No acute distress. HEENT: [place] AT, moist mucus membranes.  Trachea midline, no masses. Cardiovascular: No clubbing,  cyanosis, or edema. Respiratory: Normal respiratory effort, no increased work of breathing. GI: Abdomen is soft, nontender, nondistended, no abdominal masses GU: No CVA tenderness.  Lymph: No cervical or inguinal lymphadenopathy. Skin: No rashes, bruises or suspicious lesions. Neurologic: Grossly intact, no focal deficits, moving all 4 extremities. Psychiatric: Normal mood and affect.  Laboratory Data: Lab Results  Component Value Date   WBC 8.1 10/26/2019   HGB  14.0 10/26/2019   HCT 44.9 10/26/2019   MCV 93.2 10/26/2019   PLT 204 10/26/2019    Lab Results  Component Value Date   CREATININE 0.68 10/26/2019    No results found for: "PSA"  No results found for: "TESTOSTERONE"  No results found for: "HGBA1C"  Urinalysis    Component Value Date/Time   COLORURINE YELLOW 10/24/2019 2033   APPEARANCEUR Clear 07/01/2021 1034   LABSPEC >1.046 (H) 10/24/2019 2033   PHURINE 5.0 10/24/2019 2033   GLUCOSEU Negative 07/01/2021 1034   HGBUR SMALL (A) 10/24/2019 2033   BILIRUBINUR Negative 07/01/2021 1034   KETONESUR NEGATIVE 10/24/2019 2033   PROTEINUR 1+ (A) 07/01/2021 1034   PROTEINUR 30 (A) 10/24/2019 2033   NITRITE Negative 07/01/2021 1034   NITRITE NEGATIVE 10/24/2019 2033   LEUKOCYTESUR Negative 07/01/2021 1034   LEUKOCYTESUR NEGATIVE 10/24/2019 2033    Lab Results  Component Value Date   LABMICR See below: 07/01/2021   WBCUA None seen 07/01/2021   LABEPIT None seen 07/01/2021   MUCUS Present 07/01/2021   BACTERIA None seen 07/01/2021    Pertinent Imaging: Renal US 06/30/2022: Images reviewed and discussed with the patient  Results for orders placed during the hospital encounter of 10/24/19  DG Abdomen 1 View  Narrative CLINICAL DATA:  Diffuse abdominal pain. Constipation.  EXAM: ABDOMEN - 1 VIEW  COMPARISON:  CT scan of the abdomen dated 01/04/2017  FINDINGS: There are multiple slightly distended small bowel loops in the mid abdomen. The colon and  stomach are not distended.  Extensive aortic atherosclerosis. Cholecystectomy. Stents in the left iliac artery. Occlusion coils in the left internal iliac artery.  No significant bone abnormality.  IMPRESSION: Findings consistent with  small bowel obstruction.   Electronically Signed By: Lorriane Shire M.D. On: 10/24/2019 14:50  No results found for this or any previous visit.  No results found for this or any previous visit.  No results found for this or any previous visit.  Results for orders placed during the hospital encounter of 06/30/22  Ultrasound renal complete  Narrative CLINICAL DATA:  Left renal mass follow-up  EXAM: RENAL / URINARY TRACT ULTRASOUND COMPLETE  COMPARISON:  June 26, 2021 renal ultrasound  FINDINGS: Right Kidney:  Renal measurements: 11.0 x 5.5 x 4.9 cm = volume: 154 mL. Echogenicity within normal limits. No mass or hydronephrosis visualized.  Left Kidney:  Renal measurements: 10.8 x 6.5 x 4.9 cm = volume: 180.5 mL. Three simple cysts are identified in the left kidney. The largest at the lower pole measures up to 6.7 cm today versus 6.2 cm previously. The other midpole cyst measures up to 4.1 cm today versus 3.9 cm previously. No follow-up imaging recommended for the cysts.  Bladder:  Appears normal for degree of bladder distention.  Other:  None.  IMPRESSION: No significant abnormalities. No follow-up imaging recommended for the left renal cysts.   Electronically Signed By: Dorise Bullion III M.D. On: 06/30/2022 17:20  No valid procedures specified. No results found for this or any previous visit.  No results found for this or any previous visit.   Assessment & Plan:    1. Left renal mass -followup 1-2 years with renal US  2. BPH w/o LUTS -patient defers therapy at this time   No follow-ups on file.  Nicolette Bang, MD  [facility] [place]

## 2022-07-22 LAB — URINALYSIS, ROUTINE W REFLEX MICROSCOPIC
Bilirubin, UA: NEGATIVE
Glucose, UA: NEGATIVE
Ketones, UA: NEGATIVE
Leukocytes,UA: NEGATIVE
Nitrite, UA: NEGATIVE
Specific Gravity, UA: 1.015 (ref 1.005–1.030)
Urobilinogen, Ur: 1 mg/dL (ref 0.2–1.0)
pH, UA: 5 (ref 5.0–7.5)

## 2022-07-22 LAB — MICROSCOPIC EXAMINATION: Bacteria, UA: NONE SEEN

## 2022-07-23 ENCOUNTER — Other Ambulatory Visit: Payer: Self-pay | Admitting: *Deleted

## 2022-07-23 DIAGNOSIS — I723 Aneurysm of iliac artery: Secondary | ICD-10-CM

## 2022-07-23 DIAGNOSIS — I739 Peripheral vascular disease, unspecified: Secondary | ICD-10-CM

## 2022-07-27 ENCOUNTER — Encounter: Payer: Self-pay | Admitting: Urology

## 2022-08-03 ENCOUNTER — Ambulatory Visit: Payer: Medicare HMO

## 2022-08-03 ENCOUNTER — Ambulatory Visit (HOSPITAL_COMMUNITY): Payer: Medicare HMO

## 2022-08-19 ENCOUNTER — Ambulatory Visit (INDEPENDENT_AMBULATORY_CARE_PROVIDER_SITE_OTHER)
Admission: RE | Admit: 2022-08-19 | Discharge: 2022-08-19 | Disposition: A | Discharge status: Home / Self Care | Payer: Medicare HMO | Source: Ambulatory Visit | Attending: Vascular Surgery | Admitting: Vascular Surgery

## 2022-08-19 ENCOUNTER — Ambulatory Visit: Payer: Medicare HMO | Admitting: Physician Assistant

## 2022-08-19 ENCOUNTER — Ambulatory Visit (HOSPITAL_COMMUNITY)
Admission: RE | Admit: 2022-08-19 | Discharge: 2022-08-19 | Disposition: A | Discharge status: Home / Self Care | Payer: Medicare HMO | Source: Ambulatory Visit | Attending: Vascular Surgery | Admitting: Vascular Surgery

## 2022-08-19 VITALS — BP 121/76 | HR 94 | Temp 97.9°F | Resp 20 | Ht 68.0 in | Wt 145.3 lb

## 2022-08-19 DIAGNOSIS — Z8679 Personal history of other diseases of the circulatory system: Secondary | ICD-10-CM

## 2022-08-19 DIAGNOSIS — F17218 Nicotine dependence, cigarettes, with other nicotine-induced disorders: Secondary | ICD-10-CM | POA: Diagnosis not present

## 2022-08-19 DIAGNOSIS — I739 Peripheral vascular disease, unspecified: Secondary | ICD-10-CM | POA: Insufficient documentation

## 2022-08-19 DIAGNOSIS — I10 Essential (primary) hypertension: Secondary | ICD-10-CM | POA: Diagnosis not present

## 2022-08-19 DIAGNOSIS — Z9889 Other specified postprocedural states: Secondary | ICD-10-CM | POA: Diagnosis not present

## 2022-08-19 DIAGNOSIS — I723 Aneurysm of iliac artery: Secondary | ICD-10-CM | POA: Diagnosis not present

## 2022-08-19 DIAGNOSIS — Z95828 Presence of other vascular implants and grafts: Secondary | ICD-10-CM | POA: Diagnosis not present

## 2022-08-19 LAB — VAS US ABI WITH/WO TBI
Left ABI: 0.89
Right ABI: 1

## 2022-08-19 NOTE — Progress Notes (Signed)
Office Note     CC:  follow up Requesting Provider:  Celene Squibb, MD  HPI: Brett Russell is a 77 y.o. ([birth year]) male who presents for routine follow up of AAA. He has remote history of open AAA repair with tube graft in 2007 by Dr. Kellie Simmering. 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 Donnetta Hutching.   He denies any abdominal pain or back pain. He does not have any pain on ambulation in his legs or at rest. No tissue loss. He does report more frequent cramping in his legs at night time over the past year. Says he has to get up and walk around for it to go away.This occurs maybe 1-2 nights per week. Feels he drinks plenty of water. He does report watching his sodium and sugar intake.   The pt is on a statin for cholesterol management.  The pt is not on a daily aspirin.   Other AC:  none The pt is on CCB for hypertension.   The pt is not diabetic Tobacco hx:  current, 1 ppd  Past Medical History:  Diagnosis Date   AAA (abdominal aortic aneurysm) ([place])    Allergy    takes Allegra daily   Anxiety    takes Xanax nightly   Aortic aneurysm ([place]) 2007   Arthritis    COPD (chronic obstructive pulmonary disease) ([facility])    Enlarged prostate    slightly   GERD (gastroesophageal reflux disease)    takes Omeprazole daily   Gout    takes Allopurinol nightly   History of bronchitis    many yrs ago   History of colon polyps    benign   History of kidney stones    hx of   Hypertension    takes Amlodipine daily   Insomnia    takes Melatonin nightly   Macular degeneration    wet-right eye .Injection of AvAStin every 10 wks   Parkinson disease 2006   takes Sinemet daily   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    Past Surgical History:  Procedure Laterality Date   ABDOMINAL AORTIC ANEURYSM REPAIR  2007   ABDOMINAL AORTIC ENDOVASCULAR STENT GRAFT Left 12/03/2016   Procedure: INSERTION OF LEFT COMMON  ILIAC STENT-LEFT  INTERNAL ILIAC  ARTERY COILING;  Surgeon: Rosetta Posner, MD;  Location: [place];  Service: Vascular;  Laterality: Left;   APPENDECTOMY     CHOLECYSTECTOMY     COLONOSCOPY N/A 11/29/2012   Procedure: COLONOSCOPY;  Surgeon: Rogene Houston, MD;  [facility];  Service: Endoscopy;  Laterality: N/A;  1200-moved to 66  Ann notified pt   COLONOSCOPY N/A 09/20/2018   Procedure: COLONOSCOPY;  Surgeon: Rogene Houston, MD;  [facility];  Service: Endoscopy;  Laterality: N/A;  2:25   COLONOSCOPY WITH PROPOFOL N/A 10/13/2021   Procedure: COLONOSCOPY WITH PROPOFOL;  Surgeon: Harvel Quale, MD;  [facility];  Service: Gastroenterology;  Laterality: N/A;  115   CYST EXCISION N/A 01/07/2020   Procedure: excision of scrotal sebaceous cyst;  Surgeon: Cleon Gustin, MD;  [facility];  Service: Urology;  Laterality: N/A;   KIDNEY SURGERY  2007   Tumor removed   POLYPECTOMY  09/20/2018   Procedure: POLYPECTOMY;  Surgeon: Rogene Houston, MD;  [facility];  Service: Endoscopy;;  colon   POLYPECTOMY  10/13/2021  Procedure: POLYPECTOMY INTESTINAL;  Surgeon: Montez Morita, Quillian Quince, MD;  [facility];  Service: Gastroenterology;;   Right knee     Cartilage removed at age 37    Social History   Socioeconomic History   Marital status: Divorced    Spouse name: Not on file   Number of children: Not on file   Years of education: Not on file   Highest education level: Not on file  Occupational History   Not on file  Tobacco Use   Smoking status: Every Day    Packs/day: 1.00    Years: 49.00    Total pack years: 49.00    Types: Cigarettes    Start date: 08/11/1969    Passive exposure: Never   Smokeless tobacco: Never  Vaping Use   Vaping Use: Never used  Substance and Sexual Activity   Alcohol use: No    Alcohol/week: 0.0 standard drinks of alcohol   Drug use: No   Sexual activity: Not Currently  Other Topics Concern   Not on  file  Social History Narrative   Not on file   Social Determinants of Health   Financial Resource Strain: Not on file  Food Insecurity: Not on file  Transportation Needs: Not on file  Physical Activity: Not on file  Stress: Not on file  Social Connections: Not on file  Intimate Partner Violence: Not on file    Family History  Problem Relation Age of Onset   Kidney disease Father    Heart disease Father    AAA (abdominal aortic aneurysm) Father    Healthy Brother    Healthy Daughter    Constipation Daughter    Healthy Daughter     Current Outpatient Medications  Medication Sig Dispense Refill   acetaminophen (TYLENOL) 500 MG tablet Take 500-1,000 mg by mouth every 6 (six) hours as needed for moderate pain.      allopurinol (ZYLOPRIM) 300 MG tablet Take 300 mg by mouth at bedtime.      ALPRAZolam (XANAX) 1 MG tablet Take 0.5 mg by mouth 2 (two) times daily.      amLODipine (NORVASC) 10 MG tablet Take 10 mg by mouth at bedtime.      atorvastatin (LIPITOR) 10 MG tablet Take 10 mg by mouth daily.     Bevacizumab (AVASTIN) 100 MG/4ML SOLN Inject 100 mg into the vein as directed. Getting every 4 months     carbidopa-levodopa (SINEMET IR) 25-100 MG per tablet Take 1 tablet by mouth in the morning and at bedtime.      fexofenadine (ALLEGRA) 180 MG tablet Take 180 mg by mouth daily as needed for allergies.      melatonin 5 MG TABS Take 5 mg by mouth at bedtime as needed (sleep).      montelukast (SINGULAIR) 10 MG tablet Take 10 mg by mouth daily.     omeprazole (PRILOSEC) 20 MG capsule Take 20 mg by mouth daily before breakfast.      ondansetron (ZOFRAN-ODT) 4 MG disintegrating tablet Take 4 mg by mouth 4 (four) times daily as needed for nausea or vomiting.     polyethylene glycol (MIRALAX / GLYCOLAX) 17 g packet Take 17 g by mouth daily as needed for mild constipation.     senna (SENOKOT) 8.6 MG TABS tablet Take 2 tablets by mouth daily as needed for mild constipation.     sodium  chloride (OCEAN) 0.65 % SOLN nasal spray Place 1 spray into both nostrils daily as needed for  congestion.     No current facility-administered medications for this visit.    Allergies  Allergen Reactions   Asa [Aspirin] Other (See Comments)    Severe nosebleeds in the past.    Morphine And Related     Hallucinations/attempt to elope from the hospital   Influenza Virus Vaccine     Rash      REVIEW OF SYSTEMS:  '[X]'$  denotes positive finding, '[ ]'$  denotes negative finding Cardiac  Comments:  Chest pain or chest pressure:    Shortness of breath upon exertion:    Short of breath when lying flat:    Irregular heart rhythm:        Vascular    Pain in calf, thigh, or hip brought on by ambulation:    Pain in feet at night that wakes you up from your sleep:     Blood clot in your veins:    Leg swelling:         Pulmonary    Oxygen at home:    Productive cough:     Wheezing:         Neurologic    Sudden weakness in arms or legs:     Sudden numbness in arms or legs:     Sudden onset of difficulty speaking or slurred speech:    Temporary loss of vision in one eye:     Problems with dizziness:         Gastrointestinal    Blood in stool:     Vomited blood:         Genitourinary    Burning when urinating:     Blood in urine:        Psychiatric    Major depression:         Hematologic    Bleeding problems:    Problems with blood clotting too easily:        Skin    Rashes or ulcers:        Constitutional    Fever or chills:      PHYSICAL EXAMINATION:  Vitals:   08/19/22 1026  BP: 121/76  Pulse: 94  Resp: 20  Temp: 97.9 F (36.6 C)  TempSrc: Temporal  SpO2: 95%  Weight: 145 lb 4.8 oz (65.9 kg)  Height: '5\' 8"'$  (1.727 m)    General:  WDWN in NAD; vital signs documented above Gait: Normal HENT: WNL, normocephalic Pulmonary: normal non-labored breathing , without wheezing Cardiac: regular HR, without  Murmurs without carotid bruit Abdomen: soft, NT, no  masses. Palpable Aortic pulse Vascular Exam/Pulses:  Right Left  Radial 2+ (normal) 2+ (normal)  Femoral 2+ (normal) 2+ (normal)  Popliteal 2+ (normal) 2+ (normal)  DP 2+ (normal) absent  PT 1+ (weak) 1+ (weak)   Extremities: without ischemic changes, without Gangrene , without cellulitis; without open wounds;  Musculoskeletal: no muscle wasting or atrophy  Neurologic: A&O X 3;  No focal weakness or paresthesias are detected Psychiatric:  The pt has Normal affect.   Non-Invasive Vascular Imaging:   +-------+-----------+-----------+------------+------------+  ABI/TBIToday's ABIToday's TBIPrevious ABIPrevious TBI  +-------+-----------+-----------+------------+------------+  Right 1.0        0.34       0.97        0.67          +-------+-----------+-----------+------------+------------+  Left  0.89       0.47       0.89        0,63          +-------+-----------+-----------+------------+------------+  VAS US Aorta/ IVC/Iliacs: Summary:  Abdominal Aorta: No evidence of an abdominal aortic aneurysm was visualized. The largest aortic measurement is 2.4 cm. Left CIA gore stent graft proximal anastomosis PSV ratoio is >3.5 suggestive of severe flow limiting stenosis. However PSV <180cm/s is suggestive of no graft disease. There is no evidence of post stenotic waveform distal to the Inflow anastomosis.   Graft velocities less than 40cm/s may possibly suggest the presence of a  threatened graft.    ASSESSMENT/PLAN:: 77 y.o. male here for follow up for AAA and PAD. He has no associated symptoms. His duplex today shows patent AAA repair without any aneurysm. Left CIA stent graft patent. Stenosis noted on duplex but this is unchanged from prior study over 1 year ago. - ABIs  are essentially stable. TBIs decreased bilaterally - discussed smoking cessation - encourage continued walking regimen - continue statin - He will follow up in 1 year with repeat ABI and aorto/ iliac  artery duplex  Karoline Caldwell, PA-C [facility] MD:   [place]on/ Trula Slade

## 2022-08-30 DIAGNOSIS — H35033 Hypertensive retinopathy, bilateral: Secondary | ICD-10-CM | POA: Diagnosis not present

## 2022-08-30 DIAGNOSIS — H43813 Vitreous degeneration, bilateral: Secondary | ICD-10-CM | POA: Diagnosis not present

## 2022-08-30 DIAGNOSIS — H35372 Puckering of macula, left eye: Secondary | ICD-10-CM | POA: Diagnosis not present

## 2022-08-30 DIAGNOSIS — H43393 Other vitreous opacities, bilateral: Secondary | ICD-10-CM | POA: Diagnosis not present

## 2022-08-30 DIAGNOSIS — H353211 Exudative age-related macular degeneration, right eye, with active choroidal neovascularization: Secondary | ICD-10-CM | POA: Diagnosis not present

## 2022-11-25 DIAGNOSIS — R7301 Impaired fasting glucose: Secondary | ICD-10-CM | POA: Diagnosis not present

## 2022-11-25 DIAGNOSIS — E782 Mixed hyperlipidemia: Secondary | ICD-10-CM | POA: Diagnosis not present

## 2022-11-25 DIAGNOSIS — Z85528 Personal history of other malignant neoplasm of kidney: Secondary | ICD-10-CM | POA: Diagnosis not present

## 2022-11-25 DIAGNOSIS — M109 Gout, unspecified: Secondary | ICD-10-CM | POA: Diagnosis not present

## 2022-12-02 DIAGNOSIS — G20A1 Parkinson's disease without dyskinesia, without mention of fluctuations: Secondary | ICD-10-CM | POA: Diagnosis not present

## 2022-12-02 DIAGNOSIS — I700 Atherosclerosis of aorta: Secondary | ICD-10-CM | POA: Diagnosis not present

## 2022-12-02 DIAGNOSIS — J449 Chronic obstructive pulmonary disease, unspecified: Secondary | ICD-10-CM | POA: Diagnosis not present

## 2022-12-02 DIAGNOSIS — I10 Essential (primary) hypertension: Secondary | ICD-10-CM | POA: Diagnosis not present

## 2022-12-02 DIAGNOSIS — Z23 Encounter for immunization: Secondary | ICD-10-CM | POA: Diagnosis not present

## 2022-12-02 DIAGNOSIS — Z0000 Encounter for general adult medical examination without abnormal findings: Secondary | ICD-10-CM | POA: Diagnosis not present

## 2022-12-02 DIAGNOSIS — K5669 Other partial intestinal obstruction: Secondary | ICD-10-CM | POA: Diagnosis not present

## 2022-12-02 DIAGNOSIS — E782 Mixed hyperlipidemia: Secondary | ICD-10-CM | POA: Diagnosis not present

## 2022-12-02 DIAGNOSIS — M109 Gout, unspecified: Secondary | ICD-10-CM | POA: Diagnosis not present

## 2023-01-26 DIAGNOSIS — H35033 Hypertensive retinopathy, bilateral: Secondary | ICD-10-CM | POA: Diagnosis not present

## 2023-01-26 DIAGNOSIS — H35372 Puckering of macula, left eye: Secondary | ICD-10-CM | POA: Diagnosis not present

## 2023-01-26 DIAGNOSIS — H43393 Other vitreous opacities, bilateral: Secondary | ICD-10-CM | POA: Diagnosis not present

## 2023-01-26 DIAGNOSIS — H353122 Nonexudative age-related macular degeneration, left eye, intermediate dry stage: Secondary | ICD-10-CM | POA: Diagnosis not present

## 2023-01-26 DIAGNOSIS — H43813 Vitreous degeneration, bilateral: Secondary | ICD-10-CM | POA: Diagnosis not present

## 2023-01-26 DIAGNOSIS — H353211 Exudative age-related macular degeneration, right eye, with active choroidal neovascularization: Secondary | ICD-10-CM | POA: Diagnosis not present

## 2023-05-23 DIAGNOSIS — Z125 Encounter for screening for malignant neoplasm of prostate: Secondary | ICD-10-CM | POA: Diagnosis not present

## 2023-05-23 DIAGNOSIS — M109 Gout, unspecified: Secondary | ICD-10-CM | POA: Diagnosis not present

## 2023-05-23 DIAGNOSIS — R7301 Impaired fasting glucose: Secondary | ICD-10-CM | POA: Diagnosis not present

## 2023-05-23 DIAGNOSIS — E782 Mixed hyperlipidemia: Secondary | ICD-10-CM | POA: Diagnosis not present

## 2023-05-23 DIAGNOSIS — Z85528 Personal history of other malignant neoplasm of kidney: Secondary | ICD-10-CM | POA: Diagnosis not present

## 2023-06-07 DIAGNOSIS — K5669 Other partial intestinal obstruction: Secondary | ICD-10-CM | POA: Diagnosis not present

## 2023-06-07 DIAGNOSIS — Z23 Encounter for immunization: Secondary | ICD-10-CM | POA: Diagnosis not present

## 2023-06-07 DIAGNOSIS — M109 Gout, unspecified: Secondary | ICD-10-CM | POA: Diagnosis not present

## 2023-06-07 DIAGNOSIS — G20A1 Parkinson's disease without dyskinesia, without mention of fluctuations: Secondary | ICD-10-CM | POA: Diagnosis not present

## 2023-06-07 DIAGNOSIS — I10 Essential (primary) hypertension: Secondary | ICD-10-CM | POA: Diagnosis not present

## 2023-06-07 DIAGNOSIS — R7301 Impaired fasting glucose: Secondary | ICD-10-CM | POA: Diagnosis not present

## 2023-06-07 DIAGNOSIS — J449 Chronic obstructive pulmonary disease, unspecified: Secondary | ICD-10-CM | POA: Diagnosis not present

## 2023-06-07 DIAGNOSIS — R269 Unspecified abnormalities of gait and mobility: Secondary | ICD-10-CM | POA: Diagnosis not present

## 2023-06-07 DIAGNOSIS — E782 Mixed hyperlipidemia: Secondary | ICD-10-CM | POA: Diagnosis not present

## 2023-06-09 DIAGNOSIS — H35033 Hypertensive retinopathy, bilateral: Secondary | ICD-10-CM | POA: Diagnosis not present

## 2023-06-09 DIAGNOSIS — H353211 Exudative age-related macular degeneration, right eye, with active choroidal neovascularization: Secondary | ICD-10-CM | POA: Diagnosis not present

## 2023-06-09 DIAGNOSIS — H43813 Vitreous degeneration, bilateral: Secondary | ICD-10-CM | POA: Diagnosis not present

## 2023-06-09 DIAGNOSIS — H353122 Nonexudative age-related macular degeneration, left eye, intermediate dry stage: Secondary | ICD-10-CM | POA: Diagnosis not present

## 2023-06-09 DIAGNOSIS — H35372 Puckering of macula, left eye: Secondary | ICD-10-CM | POA: Diagnosis not present

## 2023-06-09 DIAGNOSIS — H43393 Other vitreous opacities, bilateral: Secondary | ICD-10-CM | POA: Diagnosis not present

## 2023-06-28 ENCOUNTER — Ambulatory Visit (HOSPITAL_COMMUNITY)
Admission: RE | Admit: 2023-06-28 | Discharge: 2023-06-28 | Disposition: A | Discharge status: Home / Self Care | Payer: Medicare HMO | Source: Ambulatory Visit | Attending: Urology | Admitting: Urology

## 2023-06-28 DIAGNOSIS — N2889 Other specified disorders of kidney and ureter: Secondary | ICD-10-CM | POA: Insufficient documentation

## 2023-06-28 DIAGNOSIS — N281 Cyst of kidney, acquired: Secondary | ICD-10-CM | POA: Diagnosis not present

## 2023-07-25 ENCOUNTER — Ambulatory Visit: Payer: Medicare HMO | Admitting: Urology

## 2023-08-31 ENCOUNTER — Ambulatory Visit: Payer: Medicare HMO | Admitting: Urology

## 2023-08-31 ENCOUNTER — Encounter: Payer: Self-pay | Admitting: Urology

## 2023-08-31 VITALS — BP 125/65 | HR 90

## 2023-08-31 DIAGNOSIS — N2889 Other specified disorders of kidney and ureter: Secondary | ICD-10-CM

## 2023-08-31 DIAGNOSIS — R351 Nocturia: Secondary | ICD-10-CM

## 2023-08-31 LAB — URINALYSIS, ROUTINE W REFLEX MICROSCOPIC
Bilirubin, UA: NEGATIVE
Glucose, UA: NEGATIVE
Ketones, UA: NEGATIVE
Leukocytes,UA: NEGATIVE
Nitrite, UA: NEGATIVE
RBC, UA: NEGATIVE
Specific Gravity, UA: 1.02 (ref 1.005–1.030)
Urobilinogen, Ur: 0.2 mg/dL (ref 0.2–1.0)
pH, UA: 6.5 (ref 5.0–7.5)

## 2023-08-31 NOTE — Progress Notes (Signed)
08/31/2023 1:24 PM   Brett Russell March 05, 1946 [id number]  Referring provider: Benita Stabile, MD [address] [place],  [place] [address]  Followup renal mass   HPI: Brett Russell is a 77yo here for followup for renal cysts, nephrolithiasis and nocturia. No stone events since last visit. No flank pain. Renal US 06/28/23 shows no calculi and stable bilateral renal cysts. No solid renal mass. PSA 1.0. IPSS 2 QOL 0 on no BPh therapy. Nocturia 0x. Urine stream strong. No straining to urinate.    PMH: Past Medical History:  Diagnosis Date   AAA (abdominal aortic aneurysm) ([facility])    Allergy    takes Allegra daily   Anxiety    takes Xanax nightly   Aortic aneurysm ([facility]) 2007   Arthritis    COPD (chronic obstructive pulmonary disease) ([facility])    Enlarged prostate    slightly   GERD (gastroesophageal reflux disease)    takes Omeprazole daily   Gout    takes Allopurinol nightly   History of bronchitis    many yrs ago   History of colon polyps    benign   History of kidney stones    hx of   Hypertension    takes Amlodipine daily   Insomnia    takes Melatonin nightly   Macular degeneration    wet-right eye .Injection of AvAStin every 10 wks   Parkinson disease 2006   takes Sinemet daily   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    Surgical History: Past Surgical History:  Procedure Laterality Date   ABDOMINAL AORTIC ANEURYSM REPAIR  2007   ABDOMINAL AORTIC ENDOVASCULAR STENT GRAFT Left 12/03/2016   Procedure: INSERTION OF LEFT COMMON  ILIAC STENT-LEFT INTERNAL ILIAC  ARTERY COILING;  Surgeon: Larina Earthly, MD;  [facility];  Service: Vascular;  Laterality: Left;   APPENDECTOMY     CHOLECYSTECTOMY     COLONOSCOPY N/A 11/29/2012   Procedure: COLONOSCOPY;  Surgeon: Malissa Hippo, MD;  [facility];  Service: Endoscopy;  Laterality: N/A;  1200-moved to 930  Ann notified pt   COLONOSCOPY N/A 09/20/2018   Procedure: COLONOSCOPY;  Surgeon:  Malissa Hippo, MD;  [facility];  Service: Endoscopy;  Laterality: N/A;  2:25   COLONOSCOPY WITH PROPOFOL N/A 10/13/2021   Procedure: COLONOSCOPY WITH PROPOFOL;  Surgeon: Dolores Frame, MD;  [facility];  Service: Gastroenterology;  Laterality: N/A;  115   CYST EXCISION N/A 01/07/2020   Procedure: excision of scrotal sebaceous cyst;  Surgeon: Malen Gauze, MD;  [facility];  Service: Urology;  Laterality: N/A;   KIDNEY SURGERY  2007   Tumor removed   POLYPECTOMY  09/20/2018   Procedure: POLYPECTOMY;  Surgeon: Malissa Hippo, MD;  [facility];  Service: Endoscopy;;  colon   POLYPECTOMY  10/13/2021   Procedure: POLYPECTOMY INTESTINAL;  Surgeon: Dolores Frame, MD;  [facility];  Service: Gastroenterology;;   Right knee     Cartilage removed at age 57    Home Medications:  Allergies as of 08/31/2023       Reactions   Asa [aspirin] Other (See Comments)   Severe nosebleeds in the past.    Morphine And Codeine    Hallucinations/attempt to elope from the hospital   Influenza Virus Vaccine    Rash         Medication List  Accurate as of August 31, 2023  1:24 PM. If you have any questions, ask your nurse or doctor.          acetaminophen 500 MG tablet Commonly known as: TYLENOL Take 500-1,000 mg by mouth every 6 (six) hours as needed for moderate pain.   allopurinol 300 MG tablet Commonly known as: ZYLOPRIM Take 300 mg by mouth at bedtime.   ALPRAZolam 1 MG tablet Commonly known as: XANAX Take 0.5 mg by mouth 2 (two) times daily.   amLODipine 10 MG tablet Commonly known as: NORVASC Take 10 mg by mouth at bedtime.   atorvastatin 10 MG tablet Commonly known as: LIPITOR Take 10 mg by mouth daily.   Avastin 100 MG/4ML Soln Generic drug: Bevacizumab Inject 100 mg into the vein as directed. Getting every 4 months   carbidopa-levodopa 25-100 MG tablet Commonly known as: SINEMET  IR Take 1 tablet by mouth in the morning and at bedtime.   fexofenadine 180 MG tablet Commonly known as: ALLEGRA Take 180 mg by mouth daily as needed for allergies.   melatonin 5 MG Tabs Take 5 mg by mouth at bedtime as needed (sleep).   montelukast 10 MG tablet Commonly known as: SINGULAIR Take 10 mg by mouth daily.   omeprazole 20 MG capsule Commonly known as: PRILOSEC Take 20 mg by mouth daily before breakfast.   ondansetron 4 MG disintegrating tablet Commonly known as: ZOFRAN-ODT Take 4 mg by mouth 4 (four) times daily as needed for nausea or vomiting.   polyethylene glycol 17 g packet Commonly known as: MIRALAX / GLYCOLAX Take 17 g by mouth daily as needed for mild constipation.   senna 8.6 MG Tabs tablet Commonly known as: SENOKOT Take 2 tablets by mouth daily as needed for mild constipation.   sodium chloride 0.65 % Soln nasal spray Commonly known as: OCEAN Place 1 spray into both nostrils daily as needed for congestion.        Allergies:  Allergies  Allergen Reactions   Asa [Aspirin] Other (See Comments)    Severe nosebleeds in the past.    Morphine And Codeine     Hallucinations/attempt to elope from the hospital   Influenza Virus Vaccine     Rash     Family History: Family History  Problem Relation Age of Onset   Kidney disease Father    Heart disease Father    AAA (abdominal aortic aneurysm) Father    Healthy Brother    Healthy Daughter    Constipation Daughter    Healthy Daughter     Social History:  reports that he has been smoking cigarettes. He started smoking about 54 years ago. He has a 54.1 pack-year smoking history. He has never been exposed to tobacco smoke. He has never used smokeless tobacco. He reports that he does not drink alcohol and does not use drugs.  ROS: All other review of systems were reviewed and are negative except what is noted above in HPI  Physical Exam: BP 125/65   Pulse 90   Constitutional:  Alert and  oriented, No acute distress. HEENT: [place] AT, moist mucus membranes.  Trachea midline, no masses. Cardiovascular: No clubbing, cyanosis, or edema. Respiratory: Normal respiratory effort, no increased work of breathing. GI: Abdomen is soft, nontender, nondistended, no abdominal masses GU: No CVA tenderness.  Lymph: No cervical or inguinal lymphadenopathy. Skin: No rashes, bruises or suspicious lesions. Neurologic: Grossly intact, no focal deficits, moving all 4 extremities. Psychiatric: Normal mood and affect.  Laboratory  Data: Lab Results  Component Value Date   WBC 8.1 10/26/2019   HGB 14.0 10/26/2019   HCT 44.9 10/26/2019   MCV 93.2 10/26/2019   PLT 204 10/26/2019    Lab Results  Component Value Date   CREATININE 0.68 10/26/2019    No results found for: "PSA"  No results found for: "TESTOSTERONE"  No results found for: "HGBA1C"  Urinalysis    Component Value Date/Time   COLORURINE YELLOW 10/24/2019 2033   APPEARANCEUR Clear 07/21/2022 1552   LABSPEC >1.046 (H) 10/24/2019 2033   PHURINE 5.0 10/24/2019 2033   GLUCOSEU Negative 07/21/2022 1552   HGBUR SMALL (A) 10/24/2019 2033   BILIRUBINUR Negative 07/21/2022 1552   KETONESUR NEGATIVE 10/24/2019 2033   PROTEINUR 2+ (A) 07/21/2022 1552   PROTEINUR 30 (A) 10/24/2019 2033   NITRITE Negative 07/21/2022 1552   NITRITE NEGATIVE 10/24/2019 2033   LEUKOCYTESUR Negative 07/21/2022 1552   LEUKOCYTESUR NEGATIVE 10/24/2019 2033    Lab Results  Component Value Date   LABMICR See below: 07/21/2022   WBCUA 0-5 07/21/2022   LABEPIT 0-10 07/21/2022   MUCUS Present 07/01/2021   BACTERIA None seen 07/21/2022    Pertinent Imaging: Renal US 06/28/2023: Images reviewed and discussed with the patient  Results for orders placed during the hospital encounter of 10/24/19  DG Abdomen 1 View  Narrative CLINICAL DATA:  Diffuse abdominal pain. Constipation.  EXAM: ABDOMEN - 1 VIEW  COMPARISON:  CT scan of the abdomen dated  01/04/2017  FINDINGS: There are multiple slightly distended small bowel loops in the mid abdomen. The colon and stomach are not distended.  Extensive aortic atherosclerosis. Cholecystectomy. Stents in the left iliac artery. Occlusion coils in the left internal iliac artery.  No significant bone abnormality.  IMPRESSION: Findings consistent with  small bowel obstruction.   Electronically Signed By: Francene Boyers M.D. On: 10/24/2019 14:50  No results found for this or any previous visit.  No results found for this or any previous visit.  No results found for this or any previous visit.  Results for orders placed during the hospital encounter of 06/28/23  Ultrasound renal complete  Narrative : PROCEDURE: US RENAL  HISTORY: Patient is a 78 y/o  M with renal cysts.  COMPARISON: U/S renal 06/30/2022, 06/26/2021, CT abdomen/pelvis 10/24/2019.  TECHNIQUE: Two-dimensional grayscale and color Doppler ultrasound of the kidneys was performed.  FINDINGS: The urinary bladder demonstrates normal anechoic echogenicity. The left ureteral jet is visualized, right is not seen.  The right kidney measures 10.3 cm. Renal cortical echotexture is mildly increased. There is no hydronephrosis. There are no stones. There is a 0.9 cm anechoic cyst within the lateral cortex.  The left kidney measures 11.9 cm. Renal cortical echotexture is mildly increased. There is no hydronephrosis. There are no stones. There are multiple anechoic cysts, largest measuring 7.1 cm.  IMPRESSION: 1. Simple appearing bilateral renal cysts. Mild increase in echogenicity of the kidneys, consistent with medical renal disease.  Thank you for allowing [place] to assist in the care of this patient.   Electronically Signed By: Lestine Box M.D. On: 06/29/2023 08:03  No results found for this or any previous visit.  No results found for this or any previous visit.  No results found for this or any previous  visit.   Assessment & Plan:    1. Left renal mass (Primary) Followup 1 year with renal US - Urinalysis, Routine w reflex microscopic  2. Nocturia -resolved   No follow-ups on file.  Brett Aye, MD  [facility] [place]

## 2023-08-31 NOTE — Patient Instructions (Signed)

## 2023-09-06 ENCOUNTER — Ambulatory Visit (INDEPENDENT_AMBULATORY_CARE_PROVIDER_SITE_OTHER): Payer: Medicare HMO

## 2023-09-06 ENCOUNTER — Other Ambulatory Visit: Payer: Self-pay | Admitting: *Deleted

## 2023-09-06 ENCOUNTER — Ambulatory Visit: Payer: Medicare HMO

## 2023-09-06 DIAGNOSIS — I723 Aneurysm of iliac artery: Secondary | ICD-10-CM

## 2023-09-06 DIAGNOSIS — Z95828 Presence of other vascular implants and grafts: Secondary | ICD-10-CM

## 2023-09-06 LAB — VAS US ABI WITH/WO TBI
Left ABI: 0.99
Right ABI: 1.05

## 2023-09-13 ENCOUNTER — Ambulatory Visit: Payer: Medicare HMO

## 2023-10-11 ENCOUNTER — Ambulatory Visit (INDEPENDENT_AMBULATORY_CARE_PROVIDER_SITE_OTHER): Payer: Medicare HMO | Admitting: Physician Assistant

## 2023-10-11 VITALS — BP 139/79 | HR 80 | Ht 68.0 in | Wt 147.0 lb

## 2023-10-11 DIAGNOSIS — Z95828 Presence of other vascular implants and grafts: Secondary | ICD-10-CM

## 2023-10-11 DIAGNOSIS — Z8679 Personal history of other diseases of the circulatory system: Secondary | ICD-10-CM

## 2023-10-11 DIAGNOSIS — Z9889 Other specified postprocedural states: Secondary | ICD-10-CM | POA: Diagnosis not present

## 2023-10-11 NOTE — Progress Notes (Signed)
 Office Note     CC:  follow up Requesting Provider:  Benita Stabile, MD  HPI: Brett Russell is a 78 y.o. (17-May-1946) male who presents for surveillance of open repair of AAA.  This was performed with a 2 graft by Dr. Hart Rochester in 2007.  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 Arbie Cookey involving coiling of the hypogastric artery.  He denies new or changing abdominal or back pain.  He is ambulatory without claudication.  He is also without rest pain or tissue loss.  He is on a daily statin.  He is an everyday smoker.   Past Medical History:  Diagnosis Date   AAA (abdominal aortic aneurysm) (HCC)    Allergy    takes Allegra daily   Anxiety    takes Xanax nightly   Aortic aneurysm (HCC) 2007   Arthritis    COPD (chronic obstructive pulmonary disease) (HCC)    Enlarged prostate    slightly   GERD (gastroesophageal reflux disease)    takes Omeprazole daily   Gout    takes Allopurinol nightly   History of bronchitis    many yrs ago   History of colon polyps    benign   History of kidney stones    hx of   Hypertension    takes Amlodipine daily   Insomnia    takes Melatonin nightly   Macular degeneration    wet-right eye .Injection of AvAStin every 10 wks   Parkinson disease (HCC) 2006   takes Sinemet daily   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    Past Surgical History:  Procedure Laterality Date   ABDOMINAL AORTIC ANEURYSM REPAIR  2007   ABDOMINAL AORTIC ENDOVASCULAR STENT GRAFT Left 12/03/2016   Procedure: INSERTION OF LEFT COMMON  ILIAC STENT-LEFT INTERNAL ILIAC  ARTERY COILING;  Surgeon: Larina Earthly, MD;  Location: [facility];  Service: Vascular;  Laterality: Left;   APPENDECTOMY     CHOLECYSTECTOMY     COLONOSCOPY N/A 11/29/2012   Procedure: COLONOSCOPY;  Surgeon: Malissa Hippo, MD;  Location: [facility];  Service: Endoscopy;  Laterality: N/A;  1200-moved to 930  Ann notified pt    COLONOSCOPY N/A 09/20/2018   Procedure: COLONOSCOPY;  Surgeon: Malissa Hippo, MD;  [facility]: [facility];  Service: Endoscopy;  Laterality: N/A;  2:25   COLONOSCOPY WITH PROPOFOL N/A 10/13/2021   Procedure: COLONOSCOPY WITH PROPOFOL;  Surgeon: Dolores Frame, MD;  [facility]: [facility];  Service: Gastroenterology;  Laterality: N/A;  115   CYST EXCISION N/A 01/07/2020   Procedure: excision of scrotal sebaceous cyst;  Surgeon: Malen Gauze, MD;  Location: [facility];  Service: Urology;  Laterality: N/A;   KIDNEY SURGERY  2007   Tumor removed   POLYPECTOMY  09/20/2018   Procedure: POLYPECTOMY;  Surgeon: Malissa Hippo, MD;  [facility];  Service: Endoscopy;;  colon   POLYPECTOMY  10/13/2021   Procedure: POLYPECTOMY INTESTINAL;  Surgeon: Dolores Frame, MD;  [facility];  Service: Gastroenterology;;   Right knee     Cartilage removed at age 36    Social History   Socioeconomic History   Marital status: Divorced    Spouse name: Not on file   Number of children: Not on file   Years of education: Not on file   Highest education level: Not on  file  Occupational History   Not on file  Tobacco Use   Smoking status: Every Day    Current packs/day: 1.00    Average packs/day: 1 pack/day for 54.2 years (54.2 ttl pk-yrs)    Types: Cigarettes    Start date: 08/11/1969    Passive exposure: Never   Smokeless tobacco: Never  Vaping Use   Vaping status: Never Used  Substance and Sexual Activity   Alcohol use: No    Alcohol/week: 0.0 standard drinks of alcohol   Drug use: No   Sexual activity: Not Currently  Other Topics Concern   Not on file  Social History Narrative   Not on file   Social Drivers of Health   Financial Resource Strain: Not on file  Food Insecurity: Not on file  Transportation Needs: Not on file  Physical Activity: Not on file  Stress: Not on file  Social Connections: Not on file  Intimate Partner Violence: Not on  file    Family History  Problem Relation Age of Onset   Kidney disease Father    Heart disease Father    AAA (abdominal aortic aneurysm) Father    Healthy Brother    Healthy Daughter    Constipation Daughter    Healthy Daughter     Current Outpatient Medications  Medication Sig Dispense Refill   acetaminophen (TYLENOL) 500 MG tablet Take 500-1,000 mg by mouth every 6 (six) hours as needed for moderate pain.      allopurinol (ZYLOPRIM) 300 MG tablet Take 300 mg by mouth at bedtime.      ALPRAZolam (XANAX) 1 MG tablet Take 0.5 mg by mouth 2 (two) times daily.      amLODipine (NORVASC) 10 MG tablet Take 10 mg by mouth at bedtime.      atorvastatin (LIPITOR) 10 MG tablet Take 10 mg by mouth daily.     Bevacizumab (AVASTIN) 100 MG/4ML SOLN Inject 100 mg into the vein as directed. Getting every 24 weeks     carbidopa-levodopa (SINEMET IR) 25-100 MG per tablet Take 1 tablet by mouth in the morning and at bedtime.      fexofenadine (ALLEGRA) 180 MG tablet Take 180 mg by mouth daily as needed for allergies.      melatonin 5 MG TABS Take 5 mg by mouth at bedtime as needed (sleep).      montelukast (SINGULAIR) 10 MG tablet Take 10 mg by mouth daily.     omeprazole (PRILOSEC) 20 MG capsule Take 20 mg by mouth daily before breakfast.      ondansetron (ZOFRAN-ODT) 4 MG disintegrating tablet Take 4 mg by mouth 4 (four) times daily as needed for nausea or vomiting.     polyethylene glycol (MIRALAX / GLYCOLAX) 17 g packet Take 17 g by mouth daily as needed for mild constipation.     senna (SENOKOT) 8.6 MG TABS tablet Take 2 tablets by mouth daily as needed for mild constipation.     sodium chloride (OCEAN) 0.65 % SOLN nasal spray Place 1 spray into both nostrils daily as needed for congestion.     No current facility-administered medications for this visit.    Allergies  Allergen Reactions   Asa [Aspirin] Other (See Comments)    Severe nosebleeds in the past.    Morphine And Codeine      Hallucinations/attempt to elope from the hospital   Influenza Virus Vaccine     Rash      REVIEW OF SYSTEMS:   [X]  denotes positive  finding, [ ]  denotes negative finding Cardiac  Comments:  Chest pain or chest pressure:    Shortness of breath upon exertion:    Short of breath when lying flat:    Irregular heart rhythm:        Vascular    Pain in calf, thigh, or hip brought on by ambulation:    Pain in feet at night that wakes you up from your sleep:     Blood clot in your veins:    Leg swelling:         Pulmonary    Oxygen at home:    Productive cough:     Wheezing:         Neurologic    Sudden weakness in arms or legs:     Sudden numbness in arms or legs:     Sudden onset of difficulty speaking or slurred speech:    Temporary loss of vision in one eye:     Problems with dizziness:         Gastrointestinal    Blood in stool:     Vomited blood:         Genitourinary    Burning when urinating:     Blood in urine:        Psychiatric    Major depression:         Hematologic    Bleeding problems:    Problems with blood clotting too easily:        Skin    Rashes or ulcers:        Constitutional    Fever or chills:      PHYSICAL EXAMINATION:  Vitals:   10/11/23 1318  BP: 139/79  Pulse: 80  TempSrc: Temporal  SpO2: 98%  Weight: 147 lb (66.7 kg)  Height: 5\' 8"  (1.727 m)    General:  WDWN in NAD; vital signs documented above Gait: Not observed HENT: WNL, normocephalic Pulmonary: normal non-labored breathing , without Rales, rhonchi,  wheezing Cardiac: regular HR Abdomen: soft, NT, no masses Skin: without rashes Vascular Exam/Pulses: Symmetrical 2+ DP pulses Extremities: without ischemic changes, without Gangrene , without cellulitis; without open wounds;  Musculoskeletal: no muscle wasting or atrophy  Neurologic: A&O X 3 Psychiatric:  The pt has Normal affect.   Non-Invasive Vascular Imaging:   Widely patent abdominal aorta Patent left common  iliac artery stent graft; stable but low flow velocity through left iliac artery  ABI/TBIToday's ABIToday's TBIPrevious ABIPrevious TBI  +-------+-----------+-----------+------------+------------+  Right 1.05       0.92       1           0.34          +-------+-----------+-----------+------------+------------+  Left  0.99       0.92       0.89        0.47         ASSESSMENT/PLAN:: 78 y.o. 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 Hawker is doing well and does not have any claudication symptoms.  He is also without rest pain or tissue loss.  On exam he has symmetrical DP pulses.  Duplex demonstrates widely patent tube graft as well as patent left iliac stent graft.  He has low flow velocity through his stent graft however this has been stable over the past several years.  On exam he has a strong 2+  left femoral pulse.  We will repeat aortoiliac duplex and ABIs in 1 year.  He will continue his statin daily.  Encouraged smoking cessation however currently has no interest in quitting.   Emilie Rutter, PA-C [facility] [phone]

## 2023-10-27 DIAGNOSIS — H43393 Other vitreous opacities, bilateral: Secondary | ICD-10-CM | POA: Diagnosis not present

## 2023-10-27 DIAGNOSIS — H353211 Exudative age-related macular degeneration, right eye, with active choroidal neovascularization: Secondary | ICD-10-CM | POA: Diagnosis not present

## 2023-10-27 DIAGNOSIS — H35372 Puckering of macula, left eye: Secondary | ICD-10-CM | POA: Diagnosis not present

## 2023-10-27 DIAGNOSIS — H35033 Hypertensive retinopathy, bilateral: Secondary | ICD-10-CM | POA: Diagnosis not present

## 2023-10-27 DIAGNOSIS — H43813 Vitreous degeneration, bilateral: Secondary | ICD-10-CM | POA: Diagnosis not present

## 2023-10-27 DIAGNOSIS — H353122 Nonexudative age-related macular degeneration, left eye, intermediate dry stage: Secondary | ICD-10-CM | POA: Diagnosis not present

## 2023-10-27 DIAGNOSIS — H26493 Other secondary cataract, bilateral: Secondary | ICD-10-CM | POA: Diagnosis not present

## 2023-11-30 DIAGNOSIS — M109 Gout, unspecified: Secondary | ICD-10-CM | POA: Diagnosis not present

## 2023-11-30 DIAGNOSIS — E782 Mixed hyperlipidemia: Secondary | ICD-10-CM | POA: Diagnosis not present

## 2023-11-30 DIAGNOSIS — Z85528 Personal history of other malignant neoplasm of kidney: Secondary | ICD-10-CM | POA: Diagnosis not present

## 2023-11-30 DIAGNOSIS — R7301 Impaired fasting glucose: Secondary | ICD-10-CM | POA: Diagnosis not present

## 2023-12-15 DIAGNOSIS — I10 Essential (primary) hypertension: Secondary | ICD-10-CM | POA: Diagnosis not present

## 2023-12-15 DIAGNOSIS — Z23 Encounter for immunization: Secondary | ICD-10-CM | POA: Diagnosis not present

## 2023-12-15 DIAGNOSIS — I700 Atherosclerosis of aorta: Secondary | ICD-10-CM | POA: Diagnosis not present

## 2023-12-15 DIAGNOSIS — E782 Mixed hyperlipidemia: Secondary | ICD-10-CM | POA: Diagnosis not present

## 2023-12-15 DIAGNOSIS — G20A1 Parkinson's disease without dyskinesia, without mention of fluctuations: Secondary | ICD-10-CM | POA: Diagnosis not present

## 2023-12-15 DIAGNOSIS — M109 Gout, unspecified: Secondary | ICD-10-CM | POA: Diagnosis not present

## 2023-12-15 DIAGNOSIS — R7301 Impaired fasting glucose: Secondary | ICD-10-CM | POA: Diagnosis not present

## 2023-12-15 DIAGNOSIS — K5669 Other partial intestinal obstruction: Secondary | ICD-10-CM | POA: Diagnosis not present

## 2023-12-15 DIAGNOSIS — Z0000 Encounter for general adult medical examination without abnormal findings: Secondary | ICD-10-CM | POA: Diagnosis not present

## 2023-12-29 ENCOUNTER — Other Ambulatory Visit (HOSPITAL_COMMUNITY): Payer: Self-pay | Admitting: Family Medicine

## 2023-12-29 ENCOUNTER — Ambulatory Visit (HOSPITAL_COMMUNITY)
Admission: RE | Admit: 2023-12-29 | Discharge: 2023-12-29 | Disposition: A | Discharge status: Home / Self Care | Source: Ambulatory Visit | Attending: Family Medicine | Admitting: Family Medicine

## 2023-12-29 DIAGNOSIS — I723 Aneurysm of iliac artery: Secondary | ICD-10-CM | POA: Diagnosis not present

## 2023-12-29 DIAGNOSIS — F419 Anxiety disorder, unspecified: Secondary | ICD-10-CM | POA: Diagnosis present

## 2023-12-29 DIAGNOSIS — Z841 Family history of disorders of kidney and ureter: Secondary | ICD-10-CM | POA: Diagnosis not present

## 2023-12-29 DIAGNOSIS — I700 Atherosclerosis of aorta: Secondary | ICD-10-CM | POA: Diagnosis not present

## 2023-12-29 DIAGNOSIS — Z720 Tobacco use: Secondary | ICD-10-CM | POA: Diagnosis not present

## 2023-12-29 DIAGNOSIS — E785 Hyperlipidemia, unspecified: Secondary | ICD-10-CM | POA: Diagnosis present

## 2023-12-29 DIAGNOSIS — I5031 Acute diastolic (congestive) heart failure: Secondary | ICD-10-CM | POA: Diagnosis not present

## 2023-12-29 DIAGNOSIS — N281 Cyst of kidney, acquired: Secondary | ICD-10-CM | POA: Diagnosis not present

## 2023-12-29 DIAGNOSIS — J441 Chronic obstructive pulmonary disease with (acute) exacerbation: Secondary | ICD-10-CM | POA: Diagnosis not present

## 2023-12-29 DIAGNOSIS — I7143 Infrarenal abdominal aortic aneurysm, without rupture: Secondary | ICD-10-CM | POA: Diagnosis not present

## 2023-12-29 DIAGNOSIS — K5909 Other constipation: Secondary | ICD-10-CM

## 2023-12-29 DIAGNOSIS — Z8679 Personal history of other diseases of the circulatory system: Secondary | ICD-10-CM | POA: Diagnosis not present

## 2023-12-29 DIAGNOSIS — R000 Tachycardia, unspecified: Secondary | ICD-10-CM | POA: Diagnosis not present

## 2023-12-29 DIAGNOSIS — Z887 Allergy status to serum and vaccine status: Secondary | ICD-10-CM | POA: Diagnosis not present

## 2023-12-29 DIAGNOSIS — Z716 Tobacco abuse counseling: Secondary | ICD-10-CM | POA: Diagnosis not present

## 2023-12-29 DIAGNOSIS — R0602 Shortness of breath: Secondary | ICD-10-CM | POA: Diagnosis not present

## 2023-12-29 DIAGNOSIS — F17200 Nicotine dependence, unspecified, uncomplicated: Secondary | ICD-10-CM | POA: Diagnosis present

## 2023-12-29 DIAGNOSIS — N400 Enlarged prostate without lower urinary tract symptoms: Secondary | ICD-10-CM | POA: Diagnosis present

## 2023-12-29 DIAGNOSIS — J449 Chronic obstructive pulmonary disease, unspecified: Secondary | ICD-10-CM | POA: Diagnosis not present

## 2023-12-29 DIAGNOSIS — Z885 Allergy status to narcotic agent status: Secondary | ICD-10-CM | POA: Diagnosis not present

## 2023-12-29 DIAGNOSIS — M109 Gout, unspecified: Secondary | ICD-10-CM | POA: Diagnosis present

## 2023-12-29 DIAGNOSIS — I10 Essential (primary) hypertension: Secondary | ICD-10-CM | POA: Diagnosis present

## 2023-12-29 DIAGNOSIS — K219 Gastro-esophageal reflux disease without esophagitis: Secondary | ICD-10-CM | POA: Diagnosis present

## 2023-12-29 DIAGNOSIS — Z8249 Family history of ischemic heart disease and other diseases of the circulatory system: Secondary | ICD-10-CM | POA: Diagnosis not present

## 2023-12-29 DIAGNOSIS — R1084 Generalized abdominal pain: Secondary | ICD-10-CM | POA: Diagnosis not present

## 2023-12-29 DIAGNOSIS — R638 Other symptoms and signs concerning food and fluid intake: Secondary | ICD-10-CM | POA: Diagnosis not present

## 2023-12-29 DIAGNOSIS — H353210 Exudative age-related macular degeneration, right eye, stage unspecified: Secondary | ICD-10-CM | POA: Diagnosis present

## 2023-12-29 DIAGNOSIS — G4700 Insomnia, unspecified: Secondary | ICD-10-CM | POA: Diagnosis present

## 2023-12-29 DIAGNOSIS — J9601 Acute respiratory failure with hypoxia: Secondary | ICD-10-CM | POA: Diagnosis present

## 2023-12-29 DIAGNOSIS — G20A1 Parkinson's disease without dyskinesia, without mention of fluctuations: Secondary | ICD-10-CM | POA: Diagnosis present

## 2023-12-29 DIAGNOSIS — K5730 Diverticulosis of large intestine without perforation or abscess without bleeding: Secondary | ICD-10-CM | POA: Diagnosis not present

## 2023-12-29 DIAGNOSIS — Z79899 Other long term (current) drug therapy: Secondary | ICD-10-CM | POA: Diagnosis not present

## 2023-12-29 MED ORDER — IOHEXOL 300 MG/ML  SOLN
100.0000 mL | Freq: Once | INTRAMUSCULAR | Status: AC | PRN
  Administered 2023-12-29: 100 mL via INTRAVENOUS

## 2023-12-29 MED ORDER — IOHEXOL 9 MG/ML PO SOLN
ORAL | Status: AC
  Filled 2023-12-29: qty 1000

## 2023-12-29 MED ORDER — IOHEXOL 9 MG/ML PO SOLN: 500.0000 mL | ORAL | Status: AC

## 2023-12-31 ENCOUNTER — Inpatient Hospital Stay (HOSPITAL_COMMUNITY)
Admission: EM | Admit: 2023-12-31 | Discharge: 2024-01-03 | DRG: 190 | Disposition: A | Discharge status: Home / Self Care | Attending: Family Medicine | Admitting: Family Medicine

## 2023-12-31 ENCOUNTER — Encounter (HOSPITAL_COMMUNITY): Payer: Self-pay

## 2023-12-31 ENCOUNTER — Emergency Department (HOSPITAL_COMMUNITY)

## 2023-12-31 ENCOUNTER — Other Ambulatory Visit: Payer: Self-pay

## 2023-12-31 DIAGNOSIS — K219 Gastro-esophageal reflux disease without esophagitis: Secondary | ICD-10-CM | POA: Diagnosis present

## 2023-12-31 DIAGNOSIS — I10 Essential (primary) hypertension: Secondary | ICD-10-CM | POA: Diagnosis present

## 2023-12-31 DIAGNOSIS — Z716 Tobacco abuse counseling: Secondary | ICD-10-CM | POA: Diagnosis not present

## 2023-12-31 DIAGNOSIS — Z8679 Personal history of other diseases of the circulatory system: Secondary | ICD-10-CM

## 2023-12-31 DIAGNOSIS — F419 Anxiety disorder, unspecified: Secondary | ICD-10-CM | POA: Diagnosis present

## 2023-12-31 DIAGNOSIS — Z841 Family history of disorders of kidney and ureter: Secondary | ICD-10-CM | POA: Diagnosis not present

## 2023-12-31 DIAGNOSIS — G4700 Insomnia, unspecified: Secondary | ICD-10-CM | POA: Diagnosis present

## 2023-12-31 DIAGNOSIS — Z885 Allergy status to narcotic agent status: Secondary | ICD-10-CM

## 2023-12-31 DIAGNOSIS — Z79899 Other long term (current) drug therapy: Secondary | ICD-10-CM | POA: Diagnosis not present

## 2023-12-31 DIAGNOSIS — G20A1 Parkinson's disease without dyskinesia, without mention of fluctuations: Secondary | ICD-10-CM | POA: Diagnosis present

## 2023-12-31 DIAGNOSIS — F17200 Nicotine dependence, unspecified, uncomplicated: Secondary | ICD-10-CM | POA: Diagnosis present

## 2023-12-31 DIAGNOSIS — Z8249 Family history of ischemic heart disease and other diseases of the circulatory system: Secondary | ICD-10-CM | POA: Diagnosis not present

## 2023-12-31 DIAGNOSIS — J441 Chronic obstructive pulmonary disease with (acute) exacerbation: Principal | ICD-10-CM

## 2023-12-31 DIAGNOSIS — H353210 Exudative age-related macular degeneration, right eye, stage unspecified: Secondary | ICD-10-CM | POA: Diagnosis present

## 2023-12-31 DIAGNOSIS — M109 Gout, unspecified: Secondary | ICD-10-CM | POA: Diagnosis present

## 2023-12-31 DIAGNOSIS — Z887 Allergy status to serum and vaccine status: Secondary | ICD-10-CM

## 2023-12-31 DIAGNOSIS — R29898 Other symptoms and signs involving the musculoskeletal system: Secondary | ICD-10-CM | POA: Insufficient documentation

## 2023-12-31 DIAGNOSIS — K5909 Other constipation: Secondary | ICD-10-CM | POA: Diagnosis present

## 2023-12-31 DIAGNOSIS — J9601 Acute respiratory failure with hypoxia: Secondary | ICD-10-CM | POA: Diagnosis present

## 2023-12-31 DIAGNOSIS — N400 Enlarged prostate without lower urinary tract symptoms: Secondary | ICD-10-CM | POA: Diagnosis present

## 2023-12-31 DIAGNOSIS — Z720 Tobacco use: Secondary | ICD-10-CM | POA: Diagnosis not present

## 2023-12-31 DIAGNOSIS — I5031 Acute diastolic (congestive) heart failure: Secondary | ICD-10-CM | POA: Diagnosis not present

## 2023-12-31 DIAGNOSIS — E785 Hyperlipidemia, unspecified: Secondary | ICD-10-CM | POA: Diagnosis present

## 2023-12-31 DIAGNOSIS — N2889 Other specified disorders of kidney and ureter: Secondary | ICD-10-CM

## 2023-12-31 LAB — CBC WITH DIFFERENTIAL/PLATELET
Abs Immature Granulocytes: 0.01 10*3/uL (ref 0.00–0.07)
Basophils Absolute: 0 10*3/uL (ref 0.0–0.1)
Basophils Relative: 1 %
Eosinophils Absolute: 0 10*3/uL (ref 0.0–0.5)
Eosinophils Relative: 1 %
HCT: 47.5 % (ref 39.0–52.0)
Hemoglobin: 16 g/dL (ref 13.0–17.0)
Immature Granulocytes: 0 %
Lymphocytes Relative: 13 %
Lymphs Abs: 0.8 10*3/uL (ref 0.7–4.0)
MCH: 31.9 pg (ref 26.0–34.0)
MCHC: 33.7 g/dL (ref 30.0–36.0)
MCV: 94.8 fL (ref 80.0–100.0)
Monocytes Absolute: 0.6 10*3/uL (ref 0.1–1.0)
Monocytes Relative: 10 %
Neutro Abs: 4.5 10*3/uL (ref 1.7–7.7)
Neutrophils Relative %: 75 %
Platelets: 162 10*3/uL (ref 150–400)
RBC: 5.01 MIL/uL (ref 4.22–5.81)
RDW: 13.2 % (ref 11.5–15.5)
WBC: 6 10*3/uL (ref 4.0–10.5)
nRBC: 0 % (ref 0.0–0.2)

## 2023-12-31 LAB — COMPREHENSIVE METABOLIC PANEL WITH GFR
ALT: 24 U/L (ref 0–44)
AST: 20 U/L (ref 15–41)
Albumin: 3.5 g/dL (ref 3.5–5.0)
Alkaline Phosphatase: 80 U/L (ref 38–126)
Anion gap: 11 (ref 5–15)
BUN: 10 mg/dL (ref 8–23)
CO2: 32 mmol/L (ref 22–32)
Calcium: 8.8 mg/dL — ABNORMAL LOW (ref 8.9–10.3)
Chloride: 95 mmol/L — ABNORMAL LOW (ref 98–111)
Creatinine, Ser: 0.71 mg/dL (ref 0.61–1.24)
GFR, Estimated: >60 mL/min (ref >60)
Glucose, Bld: 121 mg/dL — ABNORMAL HIGH (ref 70–99)
Potassium: 3.8 mmol/L (ref 3.5–5.1)
Sodium: 138 mmol/L (ref 135–145)
Total Bilirubin: 1 mg/dL (ref 0.0–1.2)
Total Protein: 6.8 g/dL (ref 6.5–8.1)

## 2023-12-31 LAB — BLOOD GAS, VENOUS
Acid-Base Excess: 11.9 mmol/L — ABNORMAL HIGH (ref 0.0–2.0)
Bicarbonate: 39.6 mmol/L — ABNORMAL HIGH (ref 20.0–28.0)
Drawn by: 67355
O2 Saturation: 87.9 %
Patient temperature: 36.8
pCO2, Ven: 60 mmHg (ref 44–60)
pH, Ven: 7.42 (ref 7.25–7.43)
pO2, Ven: 55 mmHg — ABNORMAL HIGH (ref 32–45)

## 2023-12-31 LAB — D-DIMER, QUANTITATIVE: D-Dimer, Quant: 0.57 ug{FEU}/mL — ABNORMAL HIGH (ref 0.00–0.50)

## 2023-12-31 LAB — BRAIN NATRIURETIC PEPTIDE: B Natriuretic Peptide: 49 pg/mL (ref 0.0–100.0)

## 2023-12-31 MED ORDER — ENOXAPARIN SODIUM 40 MG/0.4ML IJ SOSY
40.0000 mg | PREFILLED_SYRINGE | INTRAMUSCULAR | Status: DC
  Administered 2023-12-31 – 2024-01-01 (×2): 40 mg via SUBCUTANEOUS
  Filled 2023-12-31 (×3): qty 0.4

## 2023-12-31 MED ORDER — ALPRAZOLAM 0.5 MG PO TABS
0.5000 mg | ORAL_TABLET | Freq: Every day | ORAL | Status: DC
  Administered 2023-12-31 – 2024-01-01 (×2): 0.5 mg via ORAL
  Filled 2023-12-31 (×3): qty 1

## 2023-12-31 MED ORDER — MELATONIN 3 MG PO TABS
6.0000 mg | ORAL_TABLET | Freq: Every evening | ORAL | Status: DC | PRN
  Administered 2024-01-01: 6 mg via ORAL
  Filled 2023-12-31: qty 2

## 2023-12-31 MED ORDER — NICOTINE 21 MG/24HR TD PT24
21.0000 mg | MEDICATED_PATCH | Freq: Every day | TRANSDERMAL | Status: DC
  Administered 2024-01-01 – 2024-01-03 (×2): 21 mg via TRANSDERMAL
  Filled 2023-12-31 (×4): qty 1

## 2023-12-31 MED ORDER — ONDANSETRON HCL 4 MG/2ML IJ SOLN: 4.0000 mg | Freq: Four times a day (QID) | INTRAMUSCULAR | Status: DC | PRN

## 2023-12-31 MED ORDER — ALLOPURINOL 300 MG PO TABS
300.0000 mg | ORAL_TABLET | Freq: Every day | ORAL | Status: DC
  Administered 2023-12-31 – 2024-01-01 (×2): 300 mg via ORAL
  Filled 2023-12-31 (×3): qty 1

## 2023-12-31 MED ORDER — SENNA 8.6 MG PO TABS
2.0000 | ORAL_TABLET | Freq: Two times a day (BID) | ORAL | Status: DC
  Administered 2023-12-31 – 2024-01-01 (×2): 17.2 mg via ORAL
  Filled 2023-12-31 (×5): qty 2

## 2023-12-31 MED ORDER — SODIUM CHLORIDE 0.9 % IV SOLN
1.0000 g | INTRAVENOUS | Status: DC
  Administered 2023-12-31 – 2024-01-01 (×2): 1 g via INTRAVENOUS
  Filled 2023-12-31 (×3): qty 10

## 2023-12-31 MED ORDER — ONDANSETRON HCL 4 MG PO TABS: 4.0000 mg | ORAL_TABLET | Freq: Four times a day (QID) | ORAL | Status: DC | PRN

## 2023-12-31 MED ORDER — POLYETHYLENE GLYCOL 3350 17 G PO PACK
17.0000 g | PACK | Freq: Two times a day (BID) | ORAL | Status: DC
  Administered 2023-12-31 – 2024-01-01 (×2): 17 g via ORAL
  Filled 2023-12-31 (×5): qty 1

## 2023-12-31 MED ORDER — ALBUTEROL SULFATE (2.5 MG/3ML) 0.083% IN NEBU
2.5000 mg | INHALATION_SOLUTION | Freq: Once | RESPIRATORY_TRACT | Status: AC
  Administered 2023-12-31: 2.5 mg via RESPIRATORY_TRACT
  Filled 2023-12-31: qty 3

## 2023-12-31 MED ORDER — METHYLPREDNISOLONE SODIUM SUCC 125 MG IJ SOLR
60.0000 mg | Freq: Two times a day (BID) | INTRAMUSCULAR | Status: DC
  Administered 2023-12-31 – 2024-01-03 (×4): 60 mg via INTRAVENOUS
  Filled 2023-12-31 (×6): qty 2

## 2023-12-31 MED ORDER — ATORVASTATIN CALCIUM 10 MG PO TABS
10.0000 mg | ORAL_TABLET | Freq: Every day | ORAL | Status: DC
  Administered 2024-01-01 – 2024-01-03 (×2): 10 mg via ORAL
  Filled 2023-12-31 (×3): qty 1

## 2023-12-31 MED ORDER — CARBIDOPA-LEVODOPA 25-100 MG PO TABS
1.0000 | ORAL_TABLET | Freq: Three times a day (TID) | ORAL | Status: DC
  Administered 2023-12-31 – 2024-01-03 (×5): 1 via ORAL
  Filled 2023-12-31 (×8): qty 1

## 2023-12-31 MED ORDER — ACETAMINOPHEN 500 MG PO TABS: 500.0000 mg | ORAL_TABLET | Freq: Four times a day (QID) | ORAL | Status: DC | PRN

## 2023-12-31 MED ORDER — ACETAMINOPHEN 650 MG RE SUPP: 650.0000 mg | Freq: Four times a day (QID) | RECTAL | Status: DC | PRN

## 2023-12-31 MED ORDER — MAGNESIUM SULFATE 2 GM/50ML IV SOLN
2.0000 g | Freq: Once | INTRAVENOUS | Status: AC
  Administered 2023-12-31: 2 g via INTRAVENOUS
  Filled 2023-12-31: qty 50

## 2023-12-31 MED ORDER — GUAIFENESIN ER 600 MG PO TB12
600.0000 mg | ORAL_TABLET | Freq: Two times a day (BID) | ORAL | Status: DC
  Administered 2023-12-31 – 2024-01-03 (×3): 600 mg via ORAL
  Filled 2023-12-31 (×5): qty 1

## 2023-12-31 MED ORDER — LACTULOSE 10 GM/15ML PO SOLN
20.0000 g | Freq: Two times a day (BID) | ORAL | Status: AC
  Administered 2023-12-31 – 2024-01-01 (×3): 20 g via ORAL
  Filled 2023-12-31 (×4): qty 30

## 2023-12-31 MED ORDER — AMLODIPINE BESYLATE 5 MG PO TABS
10.0000 mg | ORAL_TABLET | Freq: Every day | ORAL | Status: DC
  Administered 2023-12-31 – 2024-01-01 (×2): 10 mg via ORAL
  Filled 2023-12-31 (×3): qty 2

## 2023-12-31 MED ORDER — IPRATROPIUM-ALBUTEROL 0.5-2.5 (3) MG/3ML IN SOLN
3.0000 mL | Freq: Once | RESPIRATORY_TRACT | Status: AC
  Administered 2023-12-31: 3 mL via RESPIRATORY_TRACT
  Filled 2023-12-31: qty 3

## 2023-12-31 MED ORDER — PANTOPRAZOLE SODIUM 40 MG PO TBEC
40.0000 mg | DELAYED_RELEASE_TABLET | Freq: Every day | ORAL | Status: DC
  Administered 2024-01-01 – 2024-01-03 (×2): 40 mg via ORAL
  Filled 2023-12-31 (×3): qty 1

## 2023-12-31 MED ORDER — ACETAMINOPHEN 325 MG PO TABS
650.0000 mg | ORAL_TABLET | Freq: Four times a day (QID) | ORAL | Status: DC | PRN
  Administered 2023-12-31: 650 mg via ORAL
  Filled 2023-12-31: qty 2

## 2023-12-31 MED ORDER — ONDANSETRON 4 MG PO TBDP: 4.0000 mg | ORAL_TABLET | Freq: Four times a day (QID) | ORAL | Status: DC | PRN

## 2023-12-31 MED ORDER — METHYLPREDNISOLONE SODIUM SUCC 125 MG IJ SOLR
125.0000 mg | Freq: Once | INTRAMUSCULAR | Status: AC
  Administered 2023-12-31: 125 mg via INTRAVENOUS
  Filled 2023-12-31: qty 2

## 2023-12-31 MED ORDER — IPRATROPIUM-ALBUTEROL 0.5-2.5 (3) MG/3ML IN SOLN
3.0000 mL | Freq: Four times a day (QID) | RESPIRATORY_TRACT | Status: DC
  Administered 2023-12-31 – 2024-01-01 (×2): 3 mL via RESPIRATORY_TRACT
  Filled 2023-12-31 (×2): qty 3

## 2023-12-31 MED ORDER — SALINE SPRAY 0.65 % NA SOLN: 1.0000 | Freq: Every day | NASAL | Status: DC | PRN

## 2023-12-31 NOTE — H&P (Signed)
 History and Physical    WAH SABIC OZH:[id number] DOB: 09/22/1945 DOA: 12/31/2023  Referring MD/NP/PA: EDP PCP:  Patient coming from: Home  Chief Complaint: Multiple complaints  HPI: Brett Russell is a 77/M with history of COPD, ongoing heavy tobacco abuse, Parkinson's disease, hypertension, dyslipidemia, chronic constipation, aortic aneurysm repair presents to the ER with multiple complaints, has 3 daughters at bedside today. - Patient reports that he is chronically constipated and has 1 bowel movement in 7 to 10 days usually, ongoing for decades but has been progressively getting worse, sent by his PCP for CT on 6/12 which noted no acute findings, moderate stool burden.  Family also reported ongoing dyspnea for few days, worsening of his chronic cough and dyspnea for the last 2 days,, he has COPD and has a chronic cough and some dyspnea at baseline.  In addition family also reports that his legs have been weaker for the last 4 to 6 weeks, patient reports that they have been weaker since last year when he got COVID. ED Course: Tachycardic, hypoxic, placed on 2 L O2, labs largely unremarkable, chest x-ray with no acute findings  Review of Systems: As per HPI otherwise 14 point review of systems negative.   Past Medical History:  Diagnosis Date   AAA (abdominal aortic aneurysm) (HCC)    Allergy    takes Allegra daily   Anxiety    takes Xanax  nightly   Aortic aneurysm (HCC) 2007   Arthritis    COPD (chronic obstructive pulmonary disease) (HCC)    Enlarged prostate    slightly   GERD (gastroesophageal reflux disease)    takes Omeprazole daily   Gout    takes Allopurinol  nightly   History of bronchitis    many yrs ago   History of colon polyps    benign   History of kidney stones    hx of   Hypertension    takes Amlodipine  daily   Insomnia    takes Melatonin nightly   Macular degeneration    wet-right eye .Injection of AvAStin  every 10 wks   Parkinson disease (HCC) 2006    takes Sinemet  daily   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    Past Surgical History:  Procedure Laterality Date   ABDOMINAL AORTIC ANEURYSM REPAIR  2007   ABDOMINAL AORTIC ENDOVASCULAR STENT GRAFT Left 12/03/2016   Procedure: INSERTION OF LEFT COMMON  ILIAC STENT-LEFT INTERNAL ILIAC  ARTERY COILING;  Surgeon: Mayo Speck, MD;  Location: [facility];  Service: Vascular;  Laterality: Left;   APPENDECTOMY     CHOLECYSTECTOMY     COLONOSCOPY N/A 11/29/2012   Procedure: COLONOSCOPY;  Surgeon: Ruby Corporal, MD;  [facility];  Service: Endoscopy;  Laterality: N/A;  1200-moved to 930  Ann notified pt   COLONOSCOPY N/A 09/20/2018   Procedure: COLONOSCOPY;  Surgeon: Ruby Corporal, MD;  [facility];  Service: Endoscopy;  Laterality: N/A;  2:25   COLONOSCOPY WITH PROPOFOL  N/A 10/13/2021   Procedure: COLONOSCOPY WITH PROPOFOL ;  Surgeon: Urban Garden, MD;  [facility];  Service: Gastroenterology;  Laterality: N/A;  115   CYST EXCISION N/A 01/07/2020   Procedure: excision of scrotal sebaceous cyst;  Surgeon: Marco Severs, MD;  Location: [facility];  Service: Urology;  Laterality: N/A;   KIDNEY SURGERY  2007   Tumor removed   POLYPECTOMY  09/20/2018   Procedure: POLYPECTOMY;  Surgeon: Angelica Kemp  U, MD;  [facility];  Service: Endoscopy;;  colon   POLYPECTOMY  10/13/2021   Procedure: POLYPECTOMY INTESTINAL;  Surgeon: Urban Garden, MD;  [facility];  Service: Gastroenterology;;   Right knee     Cartilage removed at age 67     reports that he has been smoking cigarettes. He started smoking about 54 years ago. He has a 54.4 pack-year smoking history. He has never been exposed to tobacco smoke. He has never used smokeless tobacco. He reports that he does not drink alcohol and does not use drugs.  Allergies  Allergen Reactions   Asa [Aspirin] Other (See Comments)    Severe nosebleeds in the past.     Morphine And Codeine Other (See Comments)    Hallucinations/attempt to elope from the hospital   Influenza Virus Vaccine Rash    Family History  Problem Relation Age of Onset   Kidney disease Father    Heart disease Father    AAA (abdominal aortic aneurysm) Father    Healthy Brother    Healthy Daughter    Constipation Daughter    Healthy Daughter      Prior to Admission medications   Medication Sig Start Date End Date Taking? Authorizing Provider  acetaminophen  (TYLENOL ) 500 MG tablet Take 500-1,000 mg by mouth every 6 (six) hours as needed for moderate pain.    Yes [provider]  allopurinol  (ZYLOPRIM ) 300 MG tablet Take 300 mg by mouth at bedtime.    Yes [provider]  ALPRAZolam  (XANAX ) 1 MG tablet Take 0.5 mg by mouth at bedtime.   Yes [provider]  amLODipine  (NORVASC ) 10 MG tablet Take 10 mg by mouth at bedtime.    Yes [provider]  atorvastatin (LIPITOR) 10 MG tablet Take 10 mg by mouth daily. 06/02/22  Yes [provider]  Bevacizumab  (AVASTIN ) 100 MG/4ML SOLN 100 mg as directed. Getting every 24 weeks Eye injection   Yes [provider]  carbidopa -levodopa  (SINEMET  IR) 25-100 MG per tablet Take 1 tablet by mouth in the morning and at bedtime.    Yes [provider]  lubiprostone (AMITIZA) 8 MCG capsule Take 8 mcg by mouth 2 (two) times daily with a meal. 12/29/23  Yes [provider]  melatonin 5 MG TABS Take 5 mg by mouth at bedtime as needed (sleep).    Yes [provider]  omeprazole (PRILOSEC) 20 MG capsule Take 20 mg by mouth daily before breakfast.    Yes [provider]  ondansetron  (ZOFRAN -ODT) 4 MG disintegrating tablet Take 4 mg by mouth 4 (four) times daily as needed for nausea or vomiting. 07/10/18  Yes [provider]  polyethylene glycol (MIRALAX  / GLYCOLAX ) 17 g packet Take 17 g by mouth every other day.   Yes [provider]  senna (SENOKOT)  8.6 MG TABS tablet Take 2 tablets by mouth every other day.   Yes [provider]  sodium chloride  (OCEAN) 0.65 % SOLN nasal spray Place 1 spray into both nostrils daily as needed for congestion.   Yes [provider]    Physical Exam: Vitals:   12/31/23 1759 12/31/23 1805 12/31/23 1817 12/31/23 1822  BP:   126/73   Pulse: (!) 127 (!) 109 (!) 104 (!) 107  Resp: (!) 26 (!) 40 (!) 29 (!) 22  Temp:      TempSrc:      SpO2: (!) 74% 94% 96% 96%  Weight:  Height:          Constitutional: NAD, calm, comfortable HEENT:no JVD Respiratory: Poor air movement, few expiratory wheezes Cardiovascular: S1S2, tachycardic Abdomen: soft, non tender, Bowel sounds positive.  Musculoskeletal: No joint deformity upper and lower extremities. Ext: no edema Skin: no rashes Neurologic: No obvious tremors, gross motor strength largely 4 out of 5, sensations intact Psychiatric: Normal judgment and insight. Alert and oriented x 3. Normal mood.   Labs on Admission: I have personally reviewed following labs and imaging studies  CBC: Recent Labs  Lab 12/31/23 1634  WBC 6.0  NEUTROABS 4.5  HGB 16.0  HCT 47.5  MCV 94.8  PLT 162   Basic Metabolic Panel: Recent Labs  Lab 12/31/23 1634  NA 138  K 3.8  CL 95*  CO2 32  GLUCOSE 121*  BUN 10  CREATININE 0.71  CALCIUM 8.8*   GFR: Estimated Creatinine Clearance: 75.1 mL/min (by C-G formula based on SCr of 0.71 mg/dL). Liver Function Tests: Recent Labs  Lab 12/31/23 1634  AST 20  ALT 24  ALKPHOS 80  BILITOT 1.0  PROT 6.8  ALBUMIN 3.5   No results for input(s): LIPASE, AMYLASE in the last 168 hours. No results for input(s): AMMONIA in the last 168 hours. Coagulation Profile: No results for input(s): INR, PROTIME in the last 168 hours. Cardiac Enzymes: No results for input(s): CKTOTAL, CKMB, CKMBINDEX, TROPONINI in the last 168 hours. BNP (last 3 results) No results for input(s): PROBNP in the  last 8760 hours. HbA1C: No results for input(s): HGBA1C in the last 72 hours. CBG: No results for input(s): GLUCAP in the last 168 hours. Lipid Profile: No results for input(s): CHOL, HDL, LDLCALC, TRIG, CHOLHDL, LDLDIRECT in the last 72 hours. Thyroid  Function Tests: No results for input(s): TSH, T4TOTAL, FREET4, T3FREE, THYROIDAB in the last 72 hours. Anemia Panel: No results for input(s): VITAMINB12, FOLATE, FERRITIN, TIBC, IRON, RETICCTPCT in the last 72 hours. Urine analysis:    Component Value Date/Time   COLORURINE YELLOW 10/24/2019 2033   APPEARANCEUR Clear 08/31/2023 1315   LABSPEC >1.046 (H) 10/24/2019 2033   PHURINE 5.0 10/24/2019 2033   GLUCOSEU Negative 08/31/2023 1315   HGBUR SMALL (A) 10/24/2019 2033   BILIRUBINUR Negative 08/31/2023 1315   KETONESUR NEGATIVE 10/24/2019 2033   PROTEINUR Trace 08/31/2023 1315   PROTEINUR 30 (A) 10/24/2019 2033   NITRITE Negative 08/31/2023 1315   NITRITE NEGATIVE 10/24/2019 2033   LEUKOCYTESUR Negative 08/31/2023 1315   LEUKOCYTESUR NEGATIVE 10/24/2019 2033   Sepsis Labs: @LABRCNTIP (procalcitonin:4,lacticidven:4) )No results found for this or any previous visit (from the past 240 hours).   Radiological Exams on Admission: DG Chest Port 1 View Result Date: 12/31/2023 CLINICAL DATA:  sob EXAM: PORTABLE CHEST 1 VIEW COMPARISON:  Chest x-ray 10/24/2019 FINDINGS: The heart and mediastinal contours are unchanged. Atherosclerotic plaque. No focal consolidation. No pulmonary edema. Nonspecific blunting of bilateral costophrenic angles. No significant pleural effusion. No pneumothorax. No acute osseous abnormality. IMPRESSION: 1. No active disease. 2.  Aortic Atherosclerosis (ICD10-I70.0). Electronically Signed   By: Morgane  Naveau M.D.   On: 12/31/2023 16:14    EKG: Independently reviewed.  Sinus tachycardia with APCs  Assessment/Plan     COPD exacerbation (HCC) Acute hypoxic respiratory  failure - Admit to telemetry, add IV Solu-Medrol , DuoNebs, ceftriaxone Mucinex  - Also check respiratory virus panel  Chronic constipation - For over a decade, allegedly told by vascular surgeon after aneurysm repair that he would likely have constipation after this - History of colonoscopy within  the last 5 years, recent CT largely unremarkable - Educated on more fiber and fluids - Continue Senokot and MiraLAX , add lactulose - Recommended follow-up with gastroenterology  Irregular heart rhythm -EKG appears to be sinus tachycardia with APCs at this time,  - Monitor on telemetry for A-fib, check TSH and echo as well  Parkinson's disease Bilateral leg weakness -Check B12, TSH -PT OT eval -Needs follow-up with neurology  Tobacco abuse -Counsel   DVT prophylaxis: lovenox Code Status: Full Code Family Communication: Daughters at bedside Disposition Plan: Inpatient ` Deforest Fast MD [facility]   12/31/2023, 7:13 PM

## 2023-12-31 NOTE — ED Triage Notes (Signed)
 Pt BIB family due to having low O2 readings. Pt stated that he has not been feeling well lately but all covid and flu tests have been negative. Pt has Hx of COPD and is a current smoker. Does not wear O2 at home. O2 75 in triage

## 2023-12-31 NOTE — ED Notes (Signed)
 Attempted report x1, waiting for call back.

## 2023-12-31 NOTE — ED Notes (Addendum)
 Pt O2 sat dropped down to 74% on room air after standing up at the bedside. Placed pt back on 3L O2, upped to 5L O2 with provider in room, sat at 94% on 5L

## 2023-12-31 NOTE — ED Provider Notes (Signed)
 [place] EMERGENCY DEPARTMENT AT [facility] Provider Note   CSN: [id number] Arrival date & time: 12/31/23  1521     Patient presents with: Low O2   Brett Russell is a 78 y.o. male.  {Add pertinent medical, surgical, social history, OB history to XBJ:[id number]} Patient has a history of COPD.  She complains of shortness of breath and mild cough with clear sputum production.   Shortness of Breath      Prior to Admission medications   Medication Sig Start Date End Date Taking? Authorizing Provider  acetaminophen  (TYLENOL ) 500 MG tablet Take 500-1,000 mg by mouth every 6 (six) hours as needed for moderate pain.     [provider]  allopurinol  (ZYLOPRIM ) 300 MG tablet Take 300 mg by mouth at bedtime.     [provider]  ALPRAZolam  (XANAX ) 1 MG tablet Take 0.5 mg by mouth 2 (two) times daily.     [provider]  amLODipine  (NORVASC ) 10 MG tablet Take 10 mg by mouth at bedtime.     [provider]  atorvastatin (LIPITOR) 10 MG tablet Take 10 mg by mouth daily. 06/02/22   [provider]  Bevacizumab  (AVASTIN ) 100 MG/4ML SOLN Inject 100 mg into the vein as directed. Getting every 24 weeks    [provider]  carbidopa -levodopa  (SINEMET  IR) 25-100 MG per tablet Take 1 tablet by mouth in the morning and at bedtime.     [provider]  fexofenadine (ALLEGRA) 180 MG tablet Take 180 mg by mouth daily as needed for allergies.     [provider]  lubiprostone (AMITIZA) 8 MCG capsule Take 8 mcg by mouth 2 (two) times daily with a meal. 12/29/23   [provider]  melatonin 5 MG TABS Take 5 mg by mouth at bedtime as needed (sleep).     [provider]  montelukast (SINGULAIR) 10 MG tablet Take 10 mg by mouth daily. 06/16/20   [provider]  omeprazole (PRILOSEC) 20 MG capsule Take 20 mg by mouth daily before breakfast.     [provider]  ondansetron  (ZOFRAN -ODT) 4 MG  disintegrating tablet Take 4 mg by mouth 4 (four) times daily as needed for nausea or vomiting. 07/10/18   [provider]  polyethylene glycol (MIRALAX  / GLYCOLAX ) 17 g packet Take 17 g by mouth daily as needed for mild constipation.    [provider]  senna (SENOKOT) 8.6 MG TABS tablet Take 2 tablets by mouth daily as needed for mild constipation.    [provider]  sodium chloride  (OCEAN) 0.65 % SOLN nasal spray Place 1 spray into both nostrils daily as needed for congestion.    [provider]    Allergies: Asa [aspirin], Morphine and codeine, and Influenza virus vaccine    Review of Systems  Respiratory:  Positive for shortness of breath.     Updated Vital Signs BP 134/67   Pulse (!) 109   Temp 98.2 F (36.8 C) (Oral)   Resp (!) 40   Ht 5' 9 (1.753 m)   Wt 67.6 kg   SpO2 94%   BMI 22.00 kg/m   Physical Exam  (all labs ordered are listed, but only abnormal results are displayed) Labs Reviewed  BLOOD GAS, VENOUS - Abnormal; Notable for the following components:      Result Value   pO2, Ven 55 (*)    Bicarbonate 39.6 (*)    Acid-Base Excess 11.9 (*)    All  other components within normal limits  COMPREHENSIVE METABOLIC PANEL WITH GFR - Abnormal; Notable for the following components:   Chloride 95 (*)    Glucose, Bld 121 (*)    Calcium 8.8 (*)    All other components within normal limits  D-DIMER, QUANTITATIVE (NOT AT [facility]) - Abnormal; Notable for the following components:   D-Dimer, Quant 0.57 (*)    All other components within normal limits  EXPECTORATED SPUTUM ASSESSMENT W GRAM STAIN, RFLX TO RESP C  CULTURE, RESPIRATORY W GRAM STAIN  BRAIN NATRIURETIC PEPTIDE  CBC WITH DIFFERENTIAL/PLATELET  CBC WITH DIFFERENTIAL/PLATELET    EKG: None  Radiology: DG Chest Port 1 View Result Date: 12/31/2023 CLINICAL DATA:  sob EXAM: PORTABLE CHEST 1 VIEW COMPARISON:  Chest x-ray 10/24/2019 FINDINGS: The heart and mediastinal contours are  unchanged. Atherosclerotic plaque. No focal consolidation. No pulmonary edema. Nonspecific blunting of bilateral costophrenic angles. No significant pleural effusion. No pneumothorax. No acute osseous abnormality. IMPRESSION: 1. No active disease. 2.  Aortic Atherosclerosis (ICD10-I70.0). Electronically Signed   By: Morgane  Naveau M.D.   On: 12/31/2023 16:14    {Document cardiac monitor, telemetry assessment procedure when appropriate:32947} Procedures   Medications Ordered in the ED  magnesium  sulfate IVPB 2 g 50 mL (has no administration in time range)  methylPREDNISolone  sodium succinate (SOLU-MEDROL ) 125 mg/2 mL injection 125 mg (125 mg Intravenous Given 12/31/23 1615)  ipratropium-albuterol  (DUONEB) 0.5-2.5 (3) MG/3ML nebulizer solution 3 mL (3 mLs Nebulization Given 12/31/23 1616)  albuterol  (PROVENTIL ) (2.5 MG/3ML) 0.083% nebulizer solution 2.5 mg (2.5 mg Nebulization Given 12/31/23 1616)      {Click here for ABCD2, HEART and other calculators REFRESH Note before signing:1}                              Medical Decision Making Amount and/or Complexity of Data Reviewed Labs: ordered. Radiology: ordered. ECG/medicine tests: ordered.  Risk Prescription drug management. Decision regarding hospitalization.   Patient with COPD exacerbation and hypoxia.  She will be admitted to medicine  {Document critical care time when appropriate  Document review of labs and clinical decision tools ie CHADS2VASC2, etc  Document your independent review of radiology images and any outside records  Document your discussion with family members, caretakers and with consultants  Document social determinants of health affecting pt's care  Document your decision making why or why not admission, treatments were needed:32947:::1}   Final diagnoses:  COPD exacerbation [facility])    ED Discharge Orders     None

## 2023-12-31 NOTE — ED Notes (Signed)
 ED TO INPATIENT HANDOFF REPORT  ED Nurse Name and Phone #: William Harris, RN  S Name/Age/Gender Brett Russell 78 y.o. male Room/Bed: APA11/APA11  Code Status   Code Status: Full Code  Home/SNF/Other Home Patient oriented to: self, place, time, and situation Is this baseline? Yes   Triage Complete: Triage complete  Chief Complaint COPD exacerbation (HCC) [J44.1]  Triage Note Pt BIB family due to having low O2 readings. Pt stated that he has not been feeling well lately but all covid and flu tests have been negative. Pt has Hx of COPD and is a current smoker. Does not wear O2 at home. O2 75 in triage    Allergies Allergies  Allergen Reactions   Asa [Aspirin] Other (See Comments)    Severe nosebleeds in the past.    Morphine And Codeine Other (See Comments)    Hallucinations/attempt to elope from the hospital   Influenza Virus Vaccine Rash    Level of Care/Admitting Diagnosis ED Disposition     ED Disposition  Admit   Condition  --   Comment  Hospital Area: [facility] [[id number]]  Level of Care: Telemetry [5]  Covid Evaluation: Asymptomatic - no recent exposure (last 10 days) testing not required  Diagnosis: COPD exacerbation [facility]) [161096]  Admitting Physician: Deforest Fast [[id number]]  Attending Physician: Deforest Fast [[id number]]  Certification:: I certify this patient will need inpatient services for at least 2 midnights  Expected Medical Readiness: 01/02/2024          B Medical/Surgery History Past Medical History:  Diagnosis Date   AAA (abdominal aortic aneurysm) (HCC)    Allergy    takes Allegra daily   Anxiety    takes Xanax  nightly   Aortic aneurysm (HCC) 2007   Arthritis    COPD (chronic obstructive pulmonary disease) (HCC)    Enlarged prostate    slightly   GERD (gastroesophageal reflux disease)    takes Omeprazole daily   Gout    takes Allopurinol  nightly   History of bronchitis    many yrs ago   History of colon polyps     benign   History of kidney stones    hx of   Hypertension    takes Amlodipine  daily   Insomnia    takes Melatonin nightly   Macular degeneration    wet-right eye .Injection of AvAStin  every 10 wks   Parkinson disease (HCC) 2006   takes Sinemet  daily   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   Past Surgical History:  Procedure Laterality Date   ABDOMINAL AORTIC ANEURYSM REPAIR  2007   ABDOMINAL AORTIC ENDOVASCULAR STENT GRAFT Left 12/03/2016   Procedure: INSERTION OF LEFT COMMON  ILIAC STENT-LEFT INTERNAL ILIAC  ARTERY COILING;  Surgeon: Mayo Speck, MD;  Location: [facility] OR;  Service: Vascular;  Laterality: Left;   APPENDECTOMY     CHOLECYSTECTOMY     COLONOSCOPY N/A 11/29/2012   Procedure: COLONOSCOPY;  Surgeon: Ruby Corporal, MD;  Location: [facility] ENDO SUITE;  Service: Endoscopy;  Laterality: N/A;  1200-moved to 930  Ann notified pt   COLONOSCOPY N/A 09/20/2018   Procedure: COLONOSCOPY;  Surgeon: Ruby Corporal, MD;  Location: [facility] ENDO SUITE;  Service: Endoscopy;  Laterality: N/A;  2:25   COLONOSCOPY WITH PROPOFOL  N/A 10/13/2021   Procedure: COLONOSCOPY WITH PROPOFOL ;  Surgeon: Urban Garden, MD;  Location: [facility] END[facility]ITE;  Service: Gastroenterology;  Laterality: N/A;  115   CYST  EXCISION N/A 01/07/2020   Procedure: excision of scrotal sebaceous cyst;  Surgeon: Marco Severs, MD;  Location: [facility] ORS;  Service: Urology;  Laterality: N/A;   KIDNEY SURGERY  2007   Tumor removed   POLYPECTOMY  09/20/2018   Procedure: POLYPECTOMY;  Surgeon: Ruby Corporal, MD;  Location: [facility];  Service: Endoscopy;;  colon   POLYPECTOMY  10/13/2021   Procedure: POLYPECTOMY INTESTINAL;  Surgeon: Urban Garden, MD;  Location: [facility];  Service: Gastroenterology;;   Right knee     Cartilage removed at age 54     A IV Location/Drains/Wounds Patient Lines/Drains/Airways Status     Active Line/Drains/Airways     Name Placement date Placement time Site  Days   Peripheral IV 12/31/23 20 G Right Antecubital 12/31/23  1610  Antecubital  less than 1   Peripheral IV 12/31/23 18 G Posterior;Right Forearm 12/31/23  1625  Forearm  less than 1            Intake/Output Last 24 hours No intake or output data in the 24 hours ending 12/31/23 1932  Labs/Imaging Results for orders placed or performed during the hospital encounter of 12/31/23 (from the past 48 hours)  Blood gas, venous (at [facility] and [facility])     Status: Abnormal   Collection Time: 12/31/23  4:34 PM  Result Value Ref Range   pH, Ven 7.42 7.25 - 7.43   pCO2, Ven 60 44 - 60 mmHg   pO2, Ven 55 (H) 32 - 45 mmHg   Bicarbonate 39.6 (H) 20.0 - 28.0 mmol/L   Acid-Base Excess 11.9 (H) 0.0 - 2.0 mmol/L   O2 Saturation 87.9 %   Patient temperature 36.8    Collection site BLOOD RIGHT WRIST    Drawn by [id number]     Comment: Performed at [facility], [address], [place], [place] [address]  Brain natriuretic peptide     Status: None   Collection Time: 12/31/23  4:34 PM  Result Value Ref Range   B Natriuretic Peptide 49.0 0.0 - 100.0 pg/mL    Comment: Performed at [facility], [address], [place], [place] [address]  CBC with Differential/Platelet     Status: None   Collection Time: 12/31/23  4:34 PM  Result Value Ref Range   WBC 6.0 4.0 - 10.5 K/uL   RBC 5.01 4.22 - 5.81 MIL/uL   Hemoglobin 16.0 13.0 - 17.0 g/dL   HCT 91.4 78.2 - 95.6 %   MCV 94.8 80.0 - 100.0 fL   MCH 31.9 26.0 - 34.0 pg   MCHC 33.7 30.0 - 36.0 g/dL   RDW 21.3 08.6 - 57.8 %   Platelets 162 150 - 400 K/uL   nRBC 0.0 0.0 - 0.2 %   Neutrophils Relative % 75 %   Neutro Abs 4.5 1.7 - 7.7 K/uL   Lymphocytes Relative 13 %   Lymphs Abs 0.8 0.7 - 4.0 K/uL   Monocytes Relative 10 %   Monocytes Absolute 0.6 0.1 - 1.0 K/uL   Eosinophils Relative 1 %   Eosinophils Absolute 0.0 0.0 - 0.5 K/uL   Basophils Relative 1 %   Basophils Absolute 0.0 0.0 - 0.1 K/uL   Immature Granulocytes 0 %   Abs Immature Granulocytes 0.01 0.00 -  0.07 K/uL    Comment: Performed at [facility], [address] [place], [place] [address]  Comprehensive metabolic panel with GFR     Status: Abnormal   Collection Time: 12/31/23  4:34 PM  Result Value Ref Range   Sodium 138 135 - 145 mmol/L   Potassium 3.8 3.5 - 5.1 mmol/L   Chloride 95 (L) 98 - 111 mmol/L   CO2 32 22 - 32 mmol/L   Glucose, Bld 121 (H) 70 - 99 mg/dL    Comment: Glucose reference range applies only to samples taken after fasting for at least 8 hours.   BUN 10 8 - 23 mg/dL   Creatinine, Ser 1.61 0.61 - 1.24 mg/dL   Calcium 8.8 (L) 8.9 - 10.3 mg/dL   Total Protein 6.8 6.5 - 8.1 g/dL   Albumin 3.5 3.5 - 5.0 g/dL   AST 20 15 - 41 U/L   ALT 24 0 - 44 U/L   Alkaline Phosphatase 80 38 - 126 U/L   Total Bilirubin 1.0 0.0 - 1.2 mg/dL   GFR, Estimated >09 >60 mL/min    Comment: (NOTE) Calculated using the CKD-EPI Creatinine Equation (2021)    Anion gap 11 5 - 15    Comment: Performed at [facility], [address] [place], [place] [address]  D-dimer, quantitative     Status: Abnormal   Collection Time: 12/31/23  4:34 PM  Result Value Ref Range   D-Dimer, Quant 0.57 (H) 0.00 - 0.50 ug/mL-FEU    Comment: (NOTE) At the manufacturer cut-off value of 0.5 g/mL FEU, this assay has a negative predictive value of 95-100%.This assay is intended for use in conjunction with a clinical pretest probability (PTP) assessment model to exclude pulmonary embolism (PE) and deep venous thrombosis (DVT) in outpatients suspected of PE or DVT. Results should be correlated with clinical presentation. Performed at [facility], [address] [place], [place] [address]    DG Chest Port 1 View Result Date: 12/31/2023 CLINICAL DATA:  sob EXAM: PORTABLE CHEST 1 VIEW COMPARISON:  Chest x-ray 10/24/2019 FINDINGS: The heart and mediastinal contours are unchanged. Atherosclerotic plaque. No focal consolidation. No pulmonary edema. Nonspecific blunting of bilateral costophrenic angles. No  significant pleural effusion. No pneumothorax. No acute osseous abnormality. IMPRESSION: 1. No active disease. 2.  Aortic Atherosclerosis (ICD10-I70.0). Electronically Signed   By: Morgane  Naveau M.D.   On: 12/31/2023 16:14    Pending Labs Unresulted Labs (From admission, onward)     Start     Ordered   12/31/23 1654  Culture, Respiratory w Gram Stain  Once,   R        12/31/23 1654   12/31/23 1650  Expectorated Sputum Assessment w Gram Stain, Rflx to Resp Cult  Once,   URGENT        12/31/23 1649   12/31/23 1551  CBC with Differential  Once,   STAT        12/31/23 1551   Signed and Held  CBC  (enoxaparin (LOVENOX)    CrCl >/= 30 ml/min)  Once,   R       Comments: Baseline for enoxaparin therapy IF NOT ALREADY DRAWN.  Notify MD if PLT < 100 K.    Signed and Held   Signed and Held  Creatinine, serum  (enoxaparin (LOVENOX)    CrCl >/= 30 ml/min)  Once,   R       Comments: Baseline for enoxaparin therapy IF NOT ALREADY DRAWN.    Signed and Held   Signed and Held  Creatinine, serum  (enoxaparin (LOVENOX)    CrCl >/= 30 ml/min)  Weekly,   R     Comments: while on enoxaparin therapy  Signed and Held   Signed and Held  TSH  Tomorrow morning,   R        Signed and Held   Signed and Held  CBC  Tomorrow morning,   R        Signed and Held   Signed and Held  Comprehensive metabolic panel  Tomorrow morning,   R        Signed and Held   Signed and Held  Vitamin B12  Tomorrow morning,   R        Signed and Held            Vitals/Pain Today's Vitals   12/31/23 1817 12/31/23 1822 12/31/23 1830 12/31/23 1845  BP: 126/73  124/68 124/77  Pulse: (!) 104 (!) 107 (!) 113 (!) 104  Resp: (!) 29 (!) 22 (!) 22   Temp:   98 F (36.7 C)   TempSrc:   Oral   SpO2: 96% 96% 93% 93%  Weight:      Height:      PainSc:        Isolation Precautions No active isolations  Medications Medications  methylPREDNISolone  sodium succinate (SOLU-MEDROL ) 125 mg/2 mL injection 125 mg (125 mg  Intravenous Given 12/31/23 1615)  ipratropium-albuterol  (DUONEB) 0.5-2.5 (3) MG/3ML nebulizer solution 3 mL (3 mLs Nebulization Given 12/31/23 1616)  albuterol  (PROVENTIL ) (2.5 MG/3ML) 0.083% nebulizer solution 2.5 mg (2.5 mg Nebulization Given 12/31/23 1616)  magnesium  sulfate IVPB 2 g 50 mL (0 g Intravenous Stopped 12/31/23 1927)    Mobility walks     Focused Assessments See Chart   R Recommendations: See Admitting Provider Note  Report given to:   Additional Notes: See Chart

## 2024-01-01 ENCOUNTER — Inpatient Hospital Stay (HOSPITAL_COMMUNITY)

## 2024-01-01 DIAGNOSIS — J441 Chronic obstructive pulmonary disease with (acute) exacerbation: Secondary | ICD-10-CM

## 2024-01-01 DIAGNOSIS — Z720 Tobacco use: Secondary | ICD-10-CM

## 2024-01-01 DIAGNOSIS — I5031 Acute diastolic (congestive) heart failure: Secondary | ICD-10-CM

## 2024-01-01 LAB — COMPREHENSIVE METABOLIC PANEL WITH GFR
ALT: 17 U/L (ref 0–44)
AST: 28 U/L (ref 15–41)
Albumin: 3.4 g/dL — ABNORMAL LOW (ref 3.5–5.0)
Alkaline Phosphatase: 83 U/L (ref 38–126)
Anion gap: 11 (ref 5–15)
BUN: 10 mg/dL (ref 8–23)
CO2: 32 mmol/L (ref 22–32)
Calcium: 8.7 mg/dL — ABNORMAL LOW (ref 8.9–10.3)
Chloride: 94 mmol/L — ABNORMAL LOW (ref 98–111)
Creatinine, Ser: 0.66 mg/dL (ref 0.61–1.24)
GFR, Estimated: >60 mL/min (ref >60)
Glucose, Bld: 155 mg/dL — ABNORMAL HIGH (ref 70–99)
Potassium: 3.8 mmol/L (ref 3.5–5.1)
Sodium: 137 mmol/L (ref 135–145)
Total Bilirubin: 0.9 mg/dL (ref 0.0–1.2)
Total Protein: 6.7 g/dL (ref 6.5–8.1)

## 2024-01-01 LAB — CBC
HCT: 48 % (ref 39.0–52.0)
Hemoglobin: 15.8 g/dL (ref 13.0–17.0)
MCH: 31.3 pg (ref 26.0–34.0)
MCHC: 32.9 g/dL (ref 30.0–36.0)
MCV: 95.2 fL (ref 80.0–100.0)
Platelets: 172 10*3/uL (ref 150–400)
RBC: 5.04 MIL/uL (ref 4.22–5.81)
RDW: 13.2 % (ref 11.5–15.5)
WBC: 3.6 10*3/uL — ABNORMAL LOW (ref 4.0–10.5)
nRBC: 0 % (ref 0.0–0.2)

## 2024-01-01 LAB — ECHOCARDIOGRAM COMPLETE
Height: 69 in
P 1/2 time: 292 ms
S' Lateral: 4 cm
Weight: 2384 [oz_av]

## 2024-01-01 LAB — TSH: TSH: 0.323 u[IU]/mL — ABNORMAL LOW (ref 0.350–4.500)

## 2024-01-01 LAB — VITAMIN B12: Vitamin B-12: 474 pg/mL (ref 180–914)

## 2024-01-01 MED ORDER — IPRATROPIUM-ALBUTEROL 0.5-2.5 (3) MG/3ML IN SOLN
3.0000 mL | Freq: Three times a day (TID) | RESPIRATORY_TRACT | Status: DC
  Administered 2024-01-01 – 2024-01-03 (×4): 3 mL via RESPIRATORY_TRACT
  Filled 2024-01-01 (×7): qty 3

## 2024-01-01 NOTE — Progress Notes (Signed)
  Echocardiogram 2D Echocardiogram has been performed.  Farley Honer, RDCS 01/01/2024, 11:15 AM

## 2024-01-01 NOTE — TOC CM/SW Note (Signed)
 Transition of Care [facility]) - Inpatient Brief Assessment   Patient Details  Name: Brett Russell MRN: [id number] Date of Birth: 1946-04-09  Transition of Care [facility]) CM/SW Contact:    Grandville Lax, LCSWA Phone Number: 01/01/2024, 9:16 AM   Clinical Narrative: Transition of Care Department [facility]) has reviewed patient and no TOC needs have been identified at this time. We will continue to monitor patient advancement through interdiciplinary progression rounds. If new patient transition needs arise, please place a TOC consult.  Transition of Care Asessment: Insurance and Status: Insurance coverage has been reviewed Patient has primary care physician: Yes Home environment has been reviewed: From home Prior level of function:: Independent Prior/Current Home Services: No current home services Social Drivers of Health Review: SDOH reviewed no interventions necessary Readmission risk has been reviewed: Yes Transition of care needs: no transition of care needs at this time

## 2024-01-01 NOTE — Plan of Care (Signed)

## 2024-01-01 NOTE — Progress Notes (Addendum)
 PROGRESS NOTE  Brett Russell, is a 78 y.o. male, DOB - 12-27-1945, ZOX:[id number]  Admit date - 12/31/2023   Admitting Physician Deforest Fast, MD  Outpatient Primary MD for the patient is Brett Bickers, MD  LOS - 1  Chief Complaint  Patient presents with   Low O2      Brief Narrative:  77/M with history of COPD, ongoing heavy tobacco abuse, Parkinson's disease, hypertension, dyslipidemia, chronic constipation, aortic aneurysm repair admitted on 12/31/2023 with acute COPD exacerbation   -Assessment and Plan: 1)Acute COPD exacerbation in setting of ongoing tobacco abuse -c/n IV Solu-Medrol , DuoNebs, ceftriaxone and Mucinex  -cough and dyspnea persist Hypoxia persist   2)Acute Hypoxic Resp Failure-----due to # 1 Currently on 2L/min  3)Irregular heart rhythm --keep on [place] -TSH 0.323, check free T 4 and Free T3  -Echo with EF of 50 to 55%, w/o aortic stenosis  4)Parkinson's Disease--- c/n sinemet   5)GERD --c/n Protonix   6)HTN---c/n Amlodipine  Iv hydralazine  prn   7)Tobaco Abuse--- Smoking cessation counseling for 4 minutes today,  Give Nicotine patch I have discussed tobacco cessation with the patient.  I have counseled the patient regarding the negative impacts of continued tobacco use including but not limited to lung cancer, COPD, and cardiovascular disease.  I have discussed alternatives to tobacco and modalities that may help facilitate tobacco cessation including but not limited to biofeedback, hypnosis, and medications.  Total time spent with tobacco counseling was 4 minutes.  8)Chronic Constipation - For over a decade, allegedly told by vascular surgeon after aneurysm repair that he would likely have constipation after this - History of colonoscopy within the last 5 years, recent CT largely unremarkable -- Continue Senokot and MiraLAX , added lactulose - Recommended outpatient follow-up with gastroenterology  9)Bilateral leg weakness - -TSH 0.323, check free T  4 and Free T3  B12 474 -PT OT eval -Needs follow-up with neurology as outpatient  Status is: Inpatient   Disposition: The patient is from: Home              Anticipated d/c is to: TBD              Anticipated d/c date is: 1 day              Patient currently is not medically stable to d/c. Barriers: Not Clinically Stable-   Code Status :  -  Code Status: Full Code   Family Communication:    NA (patient is alert, awake and coherent)   DVT Prophylaxis  :   - SCDs  enoxaparin (LOVENOX) injection 40 mg Start: 12/31/23 2115   Lab Results  Component Value Date   PLT 172 01/01/2024   Inpatient Medications  Scheduled Meds:  allopurinol   300 mg Oral QHS   ALPRAZolam   0.5 mg Oral QHS   amLODipine   10 mg Oral QHS   atorvastatin  10 mg Oral Daily   carbidopa -levodopa   1 tablet Oral TID   enoxaparin (LOVENOX) injection  40 mg Subcutaneous Q24H   guaiFENesin   600 mg Oral BID   ipratropium-albuterol   3 mL Nebulization TID   lactulose  20 g Oral BID   methylPREDNISolone  (SOLU-MEDROL ) injection  60 mg Intravenous Q12H   nicotine  21 mg Transdermal Daily   pantoprazole   40 mg Oral Daily   polyethylene glycol  17 g Oral BID   senna  2 tablet Oral BID   Continuous Infusions:  cefTRIAXone (ROCEPHIN)  IV 1 g (12/31/23 2135)   PRN Meds:.acetaminophen  **OR** acetaminophen ,  melatonin, ondansetron  **OR** ondansetron  (ZOFRAN ) IV, sodium chloride    Anti-infectives (From admission, onward)    Start     Dose/Rate Route Frequency Ordered Stop   12/31/23 2115  cefTRIAXone (ROCEPHIN) 1 g in sodium chloride  0.9 % 100 mL IVPB        1 g 200 mL/hr over 30 Minutes Intravenous Every 24 hours 12/31/23 2015        Subjective: [name] today has no fevers, no emesis,  No chest pain,   -cough and dyspnea persist -hypoxia persist  Objective: Vitals:   01/01/24 0400 01/01/24 0836 01/01/24 1309 01/01/24 1450  BP: 108/63  (!) 143/77   Pulse: 92  (!) 107   Resp: 20     Temp: 97.8 F (36.6  C)  97.8 F (36.6 C)   TempSrc: Oral  Oral   SpO2: 92% (!) 89% 92% 92%  Weight:      Height:        Intake/Output Summary (Last 24 hours) at 01/01/2024 1707 Last data filed at 01/01/2024 1500 Gross per 24 hour  Intake 240 ml  Output --  Net 240 ml   Filed Weights   12/31/23 1532  Weight: 67.6 kg    Physical Exam  Gen:- Awake Alert, no conversational dyspnea, has dyspnea on exertion HEENT:- [place].AT, No sclera icterus Nose--[place] 2 L/min Neck-Supple Neck,No JVD,.  Lungs-  fair symmetrical air movement, No wheezing CV- S1, S2 normal, regular  Abd-  +ve B.Sounds, Abd Soft, No tenderness,    Extremity/Skin:- No  edema, pedal pulses present  Psych-affect is appropriate, oriented x3 Neuro-no new focal deficits, +ve parkinsonian tremors  Data Reviewed: I have personally reviewed following labs and imaging studies  CBC: Recent Labs  Lab 12/31/23 1634 01/01/24 0231  WBC 6.0 3.6*  NEUTROABS 4.5  --   HGB 16.0 15.8  HCT 47.5 48.0  MCV 94.8 95.2  PLT 162 172   Basic Metabolic Panel: Recent Labs  Lab 12/31/23 1634 01/01/24 0231  NA 138 137  K 3.8 3.8  CL 95* 94*  CO2 32 32  GLUCOSE 121* 155*  BUN 10 10  CREATININE 0.71 0.66  CALCIUM 8.8* 8.7*   GFR: Estimated Creatinine Clearance: 75.1 mL/min (by C-G formula based on SCr of 0.66 mg/dL). Liver Function Tests: Recent Labs  Lab 12/31/23 1634 01/01/24 0231  AST 20 28  ALT 24 17  ALKPHOS 80 83  BILITOT 1.0 0.9  PROT 6.8 6.7  ALBUMIN 3.5 3.4*   Recent Results (from the past 240 hours)  Culture, Respiratory w Gram Stain     Status: None (Preliminary result)   Collection Time: 12/31/23  4:54 PM   Specimen: SPU  Result Value Ref Range Status   Specimen Description   Final    SPUTUM Performed at [facility], [address] [place], [place] [address]    Special Requests   Final    NONE Performed at [facility], [address], [place], [place] [address]    Gram Stain   Final    ABUNDANT WBC PRESENT,  PREDOMINANTLY PMN MODERATE SQUAMOUS EPITHELIAL CELLS PRESENT ABUNDANT GRAM POSITIVE COCCI IN CHAINS MODERATE GRAM POSITIVE COCCI IN CLUSTERS RARE GRAM POSITIVE RODS Performed at [facility], [address] [place], [place] [address]    Culture PENDING  Incomplete   Report Status PENDING  Incomplete    Radiology Studies: ECHOCARDIOGRAM COMPLETE Result Date: 01/01/2024    ECHOCARDIOGRAM REPORT   Patient Name:   PEARSON PICOU Macon  Date of Exam: 01/01/2024 Medical Rec #:  [id number]       Height:       69.0 in Accession #:    [id number]      Weight:       149.0 lb Date of Birth:  [birth year]       BSA:          1.823 m Patient Age:    77 years        BP:           108/63 mmHg Patient Gender: M               HR:           105 bpm. Exam Location:  [facility] Procedure: 2D Echo, 3D Echo, Cardiac Doppler, Color Doppler and Strain Analysis            (Both Spectral and Color Flow Doppler were utilized during            procedure). Indications:     CHF- Acute Diastolic I50.31  History:         Patient has no prior history of Echocardiogram examinations.                  COPD, Signs/Symptoms:Shortness of Breath; Risk                  Factors:Hypertension.  Sonographer:     Kip Peon RDCS Referring Phys:  [id number] Deforest Fast Diagnosing Phys: Carson Clara MD  Sonographer Comments: Global longitudinal strain was attempted. IMPRESSIONS  1. Left ventricular ejection fraction, by estimation, is 50 to 55%. Left ventricular ejection fraction by 3D volume is 53 %. The left ventricle has low normal function. The left ventricle has no regional wall motion abnormalities. Left ventricular diastolic parameters are indeterminate.  2. Right ventricular systolic function is normal. The right ventricular size is normal. There is normal pulmonary artery systolic pressure. The estimated right ventricular systolic pressure is 26.4 mmHg.  3. The mitral valve is normal in structure. Trivial mitral valve regurgitation.  4.  The aortic valve is tricuspid. Aortic valve regurgitation is moderate. No aortic stenosis is present.  5. The inferior vena cava is normal in size with greater than 50% respiratory variability, suggesting right atrial pressure of 3 mmHg. FINDINGS  Left Ventricle: Left ventricular ejection fraction, by estimation, is 50 to 55%. Left ventricular ejection fraction by 3D volume is 53 %. The left ventricle has low normal function. The left ventricle has no regional wall motion abnormalities. The left ventricular internal cavity size was normal in size. There is no left ventricular hypertrophy. Left ventricular diastolic parameters are indeterminate. Right Ventricle: The right ventricular size is normal. No increase in right ventricular wall thickness. Right ventricular systolic function is normal. There is normal pulmonary artery systolic pressure. The tricuspid regurgitant velocity is 2.42 m/s, and  with an assumed right atrial pressure of 3 mmHg, the estimated right ventricular systolic pressure is 26.4 mmHg. Left Atrium: Left atrial size was normal in size. Right Atrium: Right atrial size was normal in size. Prominent Chiari network. Pericardium: There is no evidence of pericardial effusion. Mitral Valve: The mitral valve is normal in structure. Trivial mitral valve regurgitation. Tricuspid Valve: The tricuspid valve is normal in structure. Tricuspid valve regurgitation is trivial. Aortic Valve: The aortic valve is tricuspid. Aortic valve regurgitation is moderate. Aortic regurgitation PHT measures 292 msec. No aortic stenosis is present. Pulmonic Valve: The pulmonic valve was grossly  normal. Pulmonic valve regurgitation is trivial. Aorta: The aortic root is normal in size and structure. Venous: The inferior vena cava is normal in size with greater than 50% respiratory variability, suggesting right atrial pressure of 3 mmHg. IAS/Shunts: The interatrial septum was not well visualized. Additional Comments: 3D was  performed not requiring image post processing on an independent workstation and was normal.  LEFT VENTRICLE PLAX 2D LVIDd:         5.50 cm         Diastology LVIDs:         4.00 cm         LV e' medial:  6.20 cm/s LV PW:         0.90 cm         LV e' lateral: 8.16 cm/s LV IVS:        0.80 cm LVOT diam:     2.00 cm LV SV:         60              3D Volume EF LV SV Index:   33              LV 3D EF:    Left LVOT Area:     3.14 cm                     ventricul                                             ar                                             ejection                                             fraction                                             by 3D                                             volume is                                             53 %.                                 3D Volume EF:                                3D EF:        53 %  LV EDV:       130 ml                                LV ESV:       62 ml                                LV SV:        69 ml RIGHT VENTRICLE             IVC RV Basal diam:  3.40 cm     IVC diam: 1.70 cm RV S prime:     17.00 cm/s TAPSE (M-mode): 2.0 cm LEFT ATRIUM             Index        RIGHT ATRIUM           Index LA diam:        3.20 cm 1.76 cm/m   RA Area:     14.40 cm LA Vol (A2C):   40.0 ml 21.94 ml/m  RA Volume:   37.10 ml  20.35 ml/m LA Vol (A4C):   32.5 ml 17.83 ml/m LA Biplane Vol: 36.8 ml 20.19 ml/m  AORTIC VALVE LVOT Vmax:   115.00 cm/s LVOT Vmean:  67.600 cm/s LVOT VTI:    0.192 m AI PHT:      292 msec  AORTA Ao Root diam: 3.80 cm Ao Asc diam:  3.80 cm TRICUSPID VALVE TR Peak grad:   23.4 mmHg TR Vmax:        242.00 cm/s  SHUNTS Systemic VTI:  0.19 m Systemic Diam: 2.00 cm Carson Clara MD Electronically signed by Carson Clara MD Signature Date/Time: 01/01/2024/11:28:30 AM    Final (Updated)    DG Chest Port 1 View Result Date: 12/31/2023 CLINICAL DATA:  sob EXAM: PORTABLE CHEST 1 VIEW COMPARISON:   Chest x-ray 10/24/2019 FINDINGS: The heart and mediastinal contours are unchanged. Atherosclerotic plaque. No focal consolidation. No pulmonary edema. Nonspecific blunting of bilateral costophrenic angles. No significant pleural effusion. No pneumothorax. No acute osseous abnormality. IMPRESSION: 1. No active disease. 2.  Aortic Atherosclerosis (ICD10-I70.0). Electronically Signed   By: Morgane  Naveau M.D.   On: 12/31/2023 16:14   Scheduled Meds:  allopurinol   300 mg Oral QHS   ALPRAZolam   0.5 mg Oral QHS   amLODipine   10 mg Oral QHS   atorvastatin  10 mg Oral Daily   carbidopa -levodopa   1 tablet Oral TID   enoxaparin (LOVENOX) injection  40 mg Subcutaneous Q24H   guaiFENesin   600 mg Oral BID   ipratropium-albuterol   3 mL Nebulization TID   lactulose  20 g Oral BID   methylPREDNISolone  (SOLU-MEDROL ) injection  60 mg Intravenous Q12H   nicotine  21 mg Transdermal Daily   pantoprazole   40 mg Oral Daily   polyethylene glycol  17 g Oral BID   senna  2 tablet Oral BID   Continuous Infusions:  cefTRIAXone (ROCEPHIN)  IV 1 g (12/31/23 2135)    LOS: 1 day   Colin Dawley M.D on 01/01/2024 at 5:07 PM  Go to [email or web address] - for contact info  [facility] - Office  [phone]  If 7PM-7AM, please contact night-coverage [email or web address] 01/01/2024, 5:07 PM

## 2024-01-02 DIAGNOSIS — Z720 Tobacco use: Secondary | ICD-10-CM | POA: Diagnosis present

## 2024-01-02 DIAGNOSIS — J441 Chronic obstructive pulmonary disease with (acute) exacerbation: Secondary | ICD-10-CM | POA: Diagnosis not present

## 2024-01-02 LAB — POCT I-STAT CREATININE: Creatinine, Ser: 0.9 mg/dL (ref 0.61–1.24)

## 2024-01-02 LAB — T4, FREE: Free T4: 0.81 ng/dL (ref 0.61–1.12)

## 2024-01-02 LAB — FOLATE: Folate: 4.5 ng/mL — ABNORMAL LOW (ref >5.9)

## 2024-01-02 MED ORDER — VITAMIN B-12 1000 MCG PO TABS
1000.0000 ug | ORAL_TABLET | Freq: Every day | ORAL | Status: DC
  Administered 2024-01-03: 1000 ug via ORAL
  Filled 2024-01-02 (×2): qty 1

## 2024-01-02 MED ORDER — LACTULOSE 10 GM/15ML PO SOLN
30.0000 g | Freq: Once | ORAL | Status: AC
  Filled 2024-01-02: qty 60

## 2024-01-02 MED ORDER — FOLIC ACID 1 MG PO TABS
1.0000 mg | ORAL_TABLET | Freq: Every day | ORAL | Status: DC
  Administered 2024-01-03: 1 mg via ORAL
  Filled 2024-01-02 (×2): qty 1

## 2024-01-02 MED ORDER — ALPRAZOLAM 0.5 MG PO TABS
0.5000 mg | ORAL_TABLET | Freq: Two times a day (BID) | ORAL | Status: DC | PRN
  Administered 2024-01-03: 0.5 mg via ORAL
  Filled 2024-01-02 (×2): qty 1

## 2024-01-02 NOTE — Progress Notes (Signed)
 Mobility Specialist Progress Note:    01/02/24 1011  Mobility  Activity Ambulated independently in hallway  Level of Assistance Independent  Assistive Device None  Distance Ambulated (ft) 125 ft  Range of Motion/Exercises Active;All extremities  Activity Response Tolerated well  Mobility Referral Yes  Mobility visit 1 Mobility  Mobility Specialist Start Time (ACUTE ONLY) 0935  Mobility Specialist Stop Time (ACUTE ONLY) [id number]  Mobility Specialist Time Calculation (min) (ACUTE ONLY) 23 min   Pt received sitting EOB, agreeable to mobility. Independently able to stand and ambulate with no AD. Tolerated well, see O2 sats below. Returned to room, all needs met.  SpO2 97% on 2L at rest SpO2 88% on 2L during ambulation SpO2 93% on 3L during ambulation   Glinda Lapping Mobility Specialist Please contact via SecureChat or  Rehab office at [phone]

## 2024-01-02 NOTE — Progress Notes (Signed)
 PT Cancellation Note  Patient Details Name: Brett Russell MRN: [id number] DOB: 03-29-1946   Cancelled Treatment:    Reason Eval/Treat Not Completed: PT screened, no needs identified, will sign off.  Patient ambulating independently in room and hallways.   1:45 PM, 01/02/24 Walton Guppy, MPT Physical Therapist with [facility] : [facility] 336 [phone] office [phone] mobile phone

## 2024-01-02 NOTE — Progress Notes (Signed)
 PROGRESS NOTE  Brett Russell, is a 78 y.o. male, DOB - 1946-04-06, ZOX:[id number]  Admit date - 12/31/2023   Admitting Physician Brett Fast, MD  Outpatient Primary MD for the patient is Brett Bickers, MD  LOS - 2  Chief Complaint  Patient presents with   Low O2      Brief Narrative:  77/M with history of COPD, ongoing heavy tobacco abuse, Parkinson's disease, hypertension, dyslipidemia, chronic constipation, aortic aneurysm repair admitted on 12/31/2023 with acute COPD exacerbation   -Assessment and Plan: 1)Acute COPD exacerbation in setting of ongoing tobacco abuse -c/n IV Solu-Medrol , DuoNebs, ceftriaxone and Mucinex  01/02/24 -cough and dyspnea persist Hypoxia persist--- O2 sats 83% on room air SpO2 97% on 2L at rest SpO2 88% on 2L during ambulation SpO2 93% on 3L during ambulation    2)Acute Hypoxic Resp Failure-----due to # 1 Currently on 2L/min  3)Irregular heart rhythm --keep on [place] -TSH 0.323, check free T 4 and Free T3  -Echo with EF of 50 to 55%, w/o aortic stenosis -- Outpatient follow-up with PCP for management of hyperparathyroidism - May need thyroid  ultrasound  4)Parkinson's Disease--- c/n sinemet  - Outpatient follow-up with neurology advised  5)GERD --c/n Protonix   6)HTN---c/n Amlodipine  Iv hydralazine  prn   7)Tobaco Abuse--- Smoking cessation advised - Continue as prescribed  8)Chronic Constipation - For over a decade, allegedly told by vascular surgeon after aneurysm repair that he would likely have constipation after this - History of colonoscopy within the last 5 years, recent CT largely unremarkable -- Continue Senokot and MiraLAX  - Recommended outpatient follow-up with gastroenterology - ok To give lactulose x 1 dose  9)Bilateral leg weakness and ambulatory concerns - -TSH 0.323, check free T 4 and Free T3  B12 474 (low Normal) -Serum folate is low at 4.5 -Give multivitamin with folate and B12 -Screened by PT with no further  needs identified   Status is: Inpatient   Disposition: The patient is from: Home              Anticipated d/c is to: TBD              Anticipated d/c date is: 1 day              Patient currently is not medically stable to d/c. Barriers: Not Clinically Stable-   Code Status :  -  Code Status: Full Code   Family Communication:    (patient is alert, awake and coherent)  - Discussed with patient's daughters Brett Russell and Brett Russell  DVT Prophylaxis  :   - SCDs  enoxaparin (LOVENOX) injection 40 mg Start: 12/31/23 2115   Lab Results  Component Value Date   PLT 172 01/01/2024   Inpatient Medications  Scheduled Meds:  allopurinol   300 mg Oral QHS   ALPRAZolam   0.5 mg Oral QHS   amLODipine   10 mg Oral QHS   atorvastatin  10 mg Oral Daily   carbidopa -levodopa   1 tablet Oral TID   enoxaparin (LOVENOX) injection  40 mg Subcutaneous Q24H   guaiFENesin   600 mg Oral BID   ipratropium-albuterol   3 mL Nebulization TID   methylPREDNISolone  (SOLU-MEDROL ) injection  60 mg Intravenous Q12H   nicotine  21 mg Transdermal Daily   pantoprazole   40 mg Oral Daily   polyethylene glycol  17 g Oral BID   senna  2 tablet Oral BID   Continuous Infusions:  cefTRIAXone (ROCEPHIN)  IV 1 g (01/01/24 2119)   PRN Meds:.acetaminophen  **OR** acetaminophen , melatonin, ondansetron  **OR**  ondansetron  (ZOFRAN ) IV, sodium chloride    Anti-infectives (From admission, onward)    Start     Dose/Rate Route Frequency Ordered Stop   12/31/23 2115  cefTRIAXone (ROCEPHIN) 1 g in sodium chloride  0.9 % 100 mL IVPB        1 g 200 mL/hr over 30 Minutes Intravenous Every 24 hours 12/31/23 2015        Subjective: Brett Russell today has no fevers, no emesis,  No chest pain,   -cough and dyspnea persist -hypoxia persist - Patient's daughter Brett Russell at bedside - Patient ordered Brett Russell on the speaker phone--- questions answered  Objective: Vitals:   01/02/24 1257 01/02/24 1310 01/02/24 1405 01/02/24 1434  BP: (!) 124/56 (!)  120/58  (!) 124/56  Pulse: (!) 115 (!) 113  (!) 115  Resp:      Temp: 98.3 F (36.8 C) (!) 97.5 F (36.4 C)  98.3 F (36.8 C)  TempSrc: Oral Oral  Oral  SpO2: 93% 92% 94% 93%  Weight:      Height:        Intake/Output Summary (Last 24 hours) at 01/02/2024 1610 Last data filed at 01/02/2024 1610 Gross per 24 hour  Intake 440 ml  Output --  Net 440 ml   Filed Weights   12/31/23 1532  Weight: 67.6 kg    Physical Exam  Gen:- Awake Alert, no conversational dyspnea, has dyspnea on exertion HEENT:- [place].AT, No sclera icterus Nose--[place] 2 L/min Neck-Supple Neck,No JVD,.  Lungs-  fair symmetrical air movement, No wheezing CV- S1, S2 normal, regular  Abd-  +ve B.Sounds, Abd Soft, No tenderness,    Extremity/Skin:- No  edema, pedal pulses present  Psych-affect is appropriate, oriented x3 Neuro-no new focal deficits, +ve parkinsonian tremors  Data Reviewed: I have personally reviewed following labs and imaging studies  CBC: Recent Labs  Lab 12/31/23 1634 01/01/24 0231  WBC 6.0 3.6*  NEUTROABS 4.5  --   HGB 16.0 15.8  HCT 47.5 48.0  MCV 94.8 95.2  PLT 162 172   Basic Metabolic Panel: Recent Labs  Lab 12/29/23 1324 12/31/23 1634 01/01/24 0231  NA  --  138 137  K  --  3.8 3.8  CL  --  95* 94*  CO2  --  32 32  GLUCOSE  --  121* 155*  BUN  --  10 10  CREATININE 0.90 0.71 0.66  CALCIUM  --  8.8* 8.7*   GFR: Estimated Creatinine Clearance: 75.1 mL/min (by C-G formula based on SCr of 0.66 mg/dL). Liver Function Tests: Recent Labs  Lab 12/31/23 1634 01/01/24 0231  AST 20 28  ALT 24 17  ALKPHOS 80 83  BILITOT 1.0 0.9  PROT 6.8 6.7  ALBUMIN 3.5 3.4*   Recent Results (from the past 240 hours)  Culture, Respiratory w Gram Stain     Status: None (Preliminary result)   Collection Time: 12/31/23  4:54 PM   Specimen: SPU  Result Value Ref Range Status   Specimen Description   Final    SPUTUM Performed at [facility], [address], [place], [place] [address]     Special Requests   Final    NONE Performed at [facility], [address], [place], [place] [address]    Gram Stain   Final    ABUNDANT WBC PRESENT, PREDOMINANTLY PMN MODERATE SQUAMOUS EPITHELIAL CELLS PRESENT ABUNDANT GRAM POSITIVE COCCI IN CHAINS MODERATE GRAM POSITIVE COCCI IN CLUSTERS RARE GRAM POSITIVE RODS    Culture   Final  CULTURE REINCUBATED FOR BETTER GROWTH Performed at [facility], [address], [place], [place] [address]    Report Status PENDING  Incomplete    Radiology Studies: ECHOCARDIOGRAM COMPLETE Result Date: 01/01/2024    ECHOCARDIOGRAM REPORT   Patient Name:   JOSEJUAN HOAGLIN Rita Date of Exam: 01/01/2024 Medical Rec #:  [id number]       Height:       69.0 in Accession #:    [id number]      Weight:       149.0 lb Date of Birth:  [birth year]       BSA:          1.823 m Patient Age:    77 years        BP:           108/63 mmHg Patient Gender: M               HR:           105 bpm. Exam Location:  [facility] Procedure: 2D Echo, 3D Echo, Cardiac Doppler, Color Doppler and Strain Analysis            (Both Spectral and Color Flow Doppler were utilized during            procedure). Indications:     CHF- Acute Diastolic I50.31  History:         Patient has no prior history of Echocardiogram examinations.                  COPD, Signs/Symptoms:Shortness of Breath; Risk                  Factors:Hypertension.  Sonographer:     Kip Peon RDCS Referring Phys:  [id number] Brett Russell Diagnosing Phys: Carson Clara MD  Sonographer Comments: Global longitudinal strain was attempted. IMPRESSIONS  1. Left ventricular ejection fraction, by estimation, is 50 to 55%. Left ventricular ejection fraction by 3D volume is 53 %. The left ventricle has low normal function. The left ventricle has no regional wall motion abnormalities. Left ventricular diastolic parameters are indeterminate.  2. Right ventricular systolic function is normal. The right ventricular size is normal. There is normal  pulmonary artery systolic pressure. The estimated right ventricular systolic pressure is 26.4 mmHg.  3. The mitral valve is normal in structure. Trivial mitral valve regurgitation.  4. The aortic valve is tricuspid. Aortic valve regurgitation is moderate. No aortic stenosis is present.  5. The inferior vena cava is normal in size with greater than 50% respiratory variability, suggesting right atrial pressure of 3 mmHg. FINDINGS  Left Ventricle: Left ventricular ejection fraction, by estimation, is 50 to 55%. Left ventricular ejection fraction by 3D volume is 53 %. The left ventricle has low normal function. The left ventricle has no regional wall motion abnormalities. The left ventricular internal cavity size was normal in size. There is no left ventricular hypertrophy. Left ventricular diastolic parameters are indeterminate. Right Ventricle: The right ventricular size is normal. No increase in right ventricular wall thickness. Right ventricular systolic function is normal. There is normal pulmonary artery systolic pressure. The tricuspid regurgitant velocity is 2.42 m/s, and  with an assumed right atrial pressure of 3 mmHg, the estimated right ventricular systolic pressure is 26.4 mmHg. Left Atrium: Left atrial size was normal in size. Right Atrium: Right atrial size was normal in size. Prominent Chiari network. Pericardium: There is no evidence of pericardial effusion. Mitral Valve: The mitral valve is normal  in structure. Trivial mitral valve regurgitation. Tricuspid Valve: The tricuspid valve is normal in structure. Tricuspid valve regurgitation is trivial. Aortic Valve: The aortic valve is tricuspid. Aortic valve regurgitation is moderate. Aortic regurgitation PHT measures 292 msec. No aortic stenosis is present. Pulmonic Valve: The pulmonic valve was grossly normal. Pulmonic valve regurgitation is trivial. Aorta: The aortic root is normal in size and structure. Venous: The inferior vena cava is normal in size  with greater than 50% respiratory variability, suggesting right atrial pressure of 3 mmHg. IAS/Shunts: The interatrial septum was not well visualized. Additional Comments: 3D was performed not requiring image post processing on an independent workstation and was normal.  LEFT VENTRICLE PLAX 2D LVIDd:         5.50 cm         Diastology LVIDs:         4.00 cm         LV e' medial:  6.20 cm/s LV PW:         0.90 cm         LV e' lateral: 8.16 cm/s LV IVS:        0.80 cm LVOT diam:     2.00 cm LV SV:         60              3D Volume EF LV SV Index:   33              LV 3D EF:    Left LVOT Area:     3.14 cm                     ventricul                                             ar                                             ejection                                             fraction                                             by 3D                                             volume is                                             53 %.                                 3D Volume EF:  3D EF:        53 %                                LV EDV:       130 ml                                LV ESV:       62 ml                                LV SV:        69 ml RIGHT VENTRICLE             IVC RV Basal diam:  3.40 cm     IVC diam: 1.70 cm RV S prime:     17.00 cm/s TAPSE (M-mode): 2.0 cm LEFT ATRIUM             Index        RIGHT ATRIUM           Index LA diam:        3.20 cm 1.76 cm/m   RA Area:     14.40 cm LA Vol (A2C):   40.0 ml 21.94 ml/m  RA Volume:   37.10 ml  20.35 ml/m LA Vol (A4C):   32.5 ml 17.83 ml/m LA Biplane Vol: 36.8 ml 20.19 ml/m  AORTIC VALVE LVOT Vmax:   115.00 cm/s LVOT Vmean:  67.600 cm/s LVOT VTI:    0.192 m AI PHT:      292 msec  AORTA Ao Root diam: 3.80 cm Ao Asc diam:  3.80 cm TRICUSPID VALVE TR Peak grad:   23.4 mmHg TR Vmax:        242.00 cm/s  SHUNTS Systemic VTI:  0.19 m Systemic Diam: 2.00 cm Carson Clara MD Electronically signed by Carson Clara  MD Signature Date/Time: 01/01/2024/11:28:30 AM    Final (Updated)    Scheduled Meds:  allopurinol   300 mg Oral QHS   ALPRAZolam   0.5 mg Oral QHS   amLODipine   10 mg Oral QHS   atorvastatin  10 mg Oral Daily   carbidopa -levodopa   1 tablet Oral TID   enoxaparin (LOVENOX) injection  40 mg Subcutaneous Q24H   guaiFENesin   600 mg Oral BID   ipratropium-albuterol   3 mL Nebulization TID   methylPREDNISolone  (SOLU-MEDROL ) injection  60 mg Intravenous Q12H   nicotine  21 mg Transdermal Daily   pantoprazole   40 mg Oral Daily   polyethylene glycol  17 g Oral BID   senna  2 tablet Oral BID   Continuous Infusions:  cefTRIAXone (ROCEPHIN)  IV 1 g (01/01/24 2119)    LOS: 2 days   Colin Dawley M.D on 01/02/2024 at 4:10 PM  Go to [email or web address] - for contact info  [facility] - Office  [phone]  If 7PM-7AM, please contact night-coverage [email or web address] 01/02/2024, 4:10 PM

## 2024-01-02 NOTE — Plan of Care (Signed)
   Problem: Education: Goal: Knowledge of General Education information will improve Description Including pain rating scale, medication(s)/side effects and non-pharmacologic comfort measures Outcome: Progressing   Problem: Education: Goal: Knowledge of General Education information will improve Description Including pain rating scale, medication(s)/side effects and non-pharmacologic comfort measures Outcome: Progressing

## 2024-01-02 NOTE — Progress Notes (Signed)
 Nurse at bedside,patient alert and oriented times four this am.No c/o pain noted. Patient was sitting on bedside,oxygen  was 83 percent room air, patient did stated  I felt a little short of breathOxygen was applied 2 liters via nasal canula. Dr Josiah Nigh notified.Plan of care on going.

## 2024-01-03 LAB — CULTURE, RESPIRATORY W GRAM STAIN: Culture: NORMAL

## 2024-01-03 LAB — T3, FREE: T3, Free: 2 pg/mL (ref 2.0–4.4)

## 2024-01-03 MED ORDER — COMPRESSOR/NEBULIZER MISC: 1.0000 [IU] | Freq: Every day | 0 refills | Status: AC | PRN

## 2024-01-03 MED ORDER — POLYETHYLENE GLYCOL 3350 17 G PO PACK: 17.0000 g | PACK | Freq: Every day | ORAL | 4 refills | Status: AC

## 2024-01-03 MED ORDER — CYANOCOBALAMIN 1000 MCG PO TABS: 1000.0000 ug | ORAL_TABLET | Freq: Every day | ORAL | 11 refills | Status: AC

## 2024-01-03 MED ORDER — GUAIFENESIN ER 600 MG PO TB12: 600.0000 mg | ORAL_TABLET | Freq: Two times a day (BID) | ORAL | 0 refills | Status: AC

## 2024-01-03 MED ORDER — AMLODIPINE BESYLATE 10 MG PO TABS: 5.0000 mg | ORAL_TABLET | Freq: Every day | ORAL | 2 refills | Status: AC

## 2024-01-03 MED ORDER — FOLIC ACID 1 MG PO TABS: 1.0000 mg | ORAL_TABLET | Freq: Every day | ORAL | 11 refills | Status: AC

## 2024-01-03 MED ORDER — AZITHROMYCIN 500 MG PO TABS: 500.0000 mg | ORAL_TABLET | Freq: Every day | ORAL | 0 refills | Status: AC

## 2024-01-03 MED ORDER — NICOTINE 21 MG/24HR TD PT24
21.0000 mg | MEDICATED_PATCH | Freq: Every day | TRANSDERMAL | 0 refills | Status: AC
Start: 2024-01-04 — End: ?

## 2024-01-03 MED ORDER — B COMPLEX VITAMINS PO CAPS: 1.0000 | ORAL_CAPSULE | Freq: Every day | ORAL | Status: AC

## 2024-01-03 MED ORDER — SENNA 8.6 MG PO TABS: 2.0000 | ORAL_TABLET | Freq: Every day | ORAL | 11 refills | Status: AC

## 2024-01-03 MED ORDER — UMECLIDINIUM-VILANTEROL 62.5-25 MCG/ACT IN AEPB: 1.0000 | INHALATION_SPRAY | Freq: Every day | RESPIRATORY_TRACT | 2 refills | Status: AC

## 2024-01-03 MED ORDER — ACETAMINOPHEN 325 MG PO TABS: 650.0000 mg | ORAL_TABLET | Freq: Four times a day (QID) | ORAL | Status: AC | PRN

## 2024-01-03 MED ORDER — LACTULOSE 10 GM/15ML PO SOLN: 20.0000 g | Freq: Every day | ORAL | 1 refills | Status: AC | PRN

## 2024-01-03 MED ORDER — ALBUTEROL SULFATE HFA 108 (90 BASE) MCG/ACT IN AERS: 2.0000 | INHALATION_SPRAY | Freq: Four times a day (QID) | RESPIRATORY_TRACT | 2 refills | Status: AC | PRN

## 2024-01-03 MED ORDER — ALBUTEROL SULFATE (2.5 MG/3ML) 0.083% IN NEBU: 2.5000 mg | INHALATION_SOLUTION | Freq: Four times a day (QID) | RESPIRATORY_TRACT | 12 refills | Status: AC | PRN

## 2024-01-03 MED ORDER — PREDNISONE 20 MG PO TABS: 40.0000 mg | ORAL_TABLET | Freq: Every day | ORAL | 0 refills | Status: AC

## 2024-01-03 NOTE — Discharge Summary (Signed)
 Brett Russell, is a 78 y.o. male  DOB [birth year]  MRN [id number].  Admission date:  12/31/2023  Admitting Physician  Deforest Fast, MD  Discharge Date:  01/03/2024   Primary MD  Omie Bickers, MD  Recommendations for primary care physician for things to follow:  1) complete abstinence from tobacco strongly advised--okay to use over-the-counter nicotine patch to help you quit smoking 2) please take medications as prescribed--- please note that there has been some changes to your medications 3) follow-up with primary care physician in 1 to 2 weeks for recheck and reevaluation  Admission Diagnosis  COPD exacerbation (HCC) [J44.1]   Discharge Diagnosis  COPD exacerbation (HCC) [J44.1]    Principal Problem:   COPD exacerbation (HCC) Active Problems:   COPD with acute exacerbation (HCC)   Parkinson disease (HCC)   HTN (hypertension)   Other constipation   GERD (gastroesophageal reflux disease)   Tobacco abuse   Leg weakness, bilateral      Past Medical History:  Diagnosis Date   AAA (abdominal aortic aneurysm) (HCC)    Allergy    takes Allegra daily   Anxiety    takes Xanax  nightly   Aortic aneurysm (HCC) 2007   Arthritis    COPD (chronic obstructive pulmonary disease) (HCC)    Enlarged prostate    slightly   GERD (gastroesophageal reflux disease)    takes Omeprazole daily   Gout    takes Allopurinol  nightly   History of bronchitis    many yrs ago   History of colon polyps    benign   History of kidney stones    hx of   Hypertension    takes Amlodipine  daily   Insomnia    takes Melatonin nightly   Macular degeneration    wet-right eye .Injection of AvAStin  every 10 wks   Parkinson disease (HCC) 2006   takes Sinemet  daily   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    Past Surgical History:  Procedure Laterality Date   ABDOMINAL AORTIC ANEURYSM REPAIR  2007    ABDOMINAL AORTIC ENDOVASCULAR STENT GRAFT Left 12/03/2016   Procedure: INSERTION OF LEFT COMMON  ILIAC STENT-LEFT INTERNAL ILIAC  ARTERY COILING;  Surgeon: Mayo Speck, MD;  Location: [facility];  Service: Vascular;  Laterality: Left;   APPENDECTOMY     CHOLECYSTECTOMY     COLONOSCOPY N/A 11/29/2012   Procedure: COLONOSCOPY;  Surgeon: Ruby Corporal, MD;  Location: [facility];  Service: Endoscopy;  Laterality: N/A;  1200-moved to 930  Ann notified pt   COLONOSCOPY N/A 09/20/2018   Procedure: COLONOSCOPY;  Surgeon: Ruby Corporal, MD;  Location: [facility];  Service: Endoscopy;  Laterality: N/A;  2:25   COLONOSCOPY WITH PROPOFOL  N/A 10/13/2021   Procedure: COLONOSCOPY WITH PROPOFOL ;  Surgeon: Urban Garden, MD;  Location: [facility];  Service: Gastroenterology;  Laterality: N/A;  115   CYST EXCISION N/A 01/07/2020   Procedure: excision of scrotal sebaceous cyst;  Surgeon: Marco Severs, MD;  Location: [facility];  Service: Urology;  Laterality: N/A;   KIDNEY SURGERY  2007   Tumor removed   POLYPECTOMY  09/20/2018   Procedure: POLYPECTOMY;  Surgeon: Ruby Corporal, MD;  Location: [facility];  Service: Endoscopy;;  colon   POLYPECTOMY  10/13/2021   Procedure: POLYPECTOMY INTESTINAL;  Surgeon: Umberto Ganong, Bearl Limes, MD;  Location: [facility];  Service: Gastroenterology;;   Right knee     Cartilage removed at age 89       HPI  from the history and physical done on the day of admission:   Chief Complaint: Multiple complaints   HPI: [name] is a 77/M with history of COPD, ongoing heavy tobacco abuse, Parkinson's disease, hypertension, dyslipidemia, chronic constipation, aortic aneurysm repair presents to the ER with multiple complaints, has 3 daughters at bedside today. - Patient reports that he is chronically constipated and has 1 bowel movement in 7 to 10 days usually, ongoing for decades but has been progressively getting worse, sent by his PCP for CT on  6/12 which noted no acute findings, moderate stool burden.  Family also reported ongoing dyspnea for few days, worsening of his chronic cough and dyspnea for the last 2 days,, he has COPD and has a chronic cough and some dyspnea at baseline.  In addition family also reports that his legs have been weaker for the last 4 to 6 weeks, patient reports that they have been weaker since last year when he got COVID. ED Course: Tachycardic, hypoxic, placed on 2 L O2, labs largely unremarkable, chest x-ray with no acute findings   Review of Systems: As per HPI otherwise 14 point review of systems negative.    Hospital Course:   Brief Narrative:  77/M with history of COPD, ongoing heavy tobacco abuse, Parkinson's disease, hypertension, dyslipidemia, chronic constipation, aortic aneurysm repair admitted on 12/31/2023 with acute COPD exacerbation   -Assessment and Plan: 1)Acute COPD exacerbation in setting of ongoing tobacco abuse -c/n IV Solu-Medrol , DuoNebs, ceftriaxone and Mucinex  01/02/24 -cough and dyspnea persist Hypoxia persist--- O2 sats 83% on room air SpO2 97% on 2L at rest SpO2 88% on 2L during ambulation SpO2 93% on 3L during ambulation    2)Acute Hypoxic Resp Failure-----due to # 1 Currently on 2L/min   3)Irregular heart rhythm --keep on [place] -TSH 0.323, check free T 4 and Free T3  -Echo with EF of 50 to 55%, w/o aortic stenosis -- Outpatient follow-up with PCP for management of hyperparathyroidism - May need thyroid  ultrasound   4)Parkinson's Disease--- c/n sinemet  - Outpatient follow-up with neurology advised   5)GERD --c/n Protonix    6)HTN---c/n Amlodipine  Iv hydralazine  prn    7)Tobaco Abuse--- Smoking cessation advised - Continue as prescribed   8)Chronic Constipation - For over a decade, allegedly told by vascular surgeon after aneurysm repair that he would likely have constipation after this - History of colonoscopy within the last 5 years, recent CT largely  unremarkable -- Continue Senokot and MiraLAX  - Recommended outpatient follow-up with gastroenterology - ok To give lactulose x 1 dose   9)Bilateral leg weakness and ambulatory concerns - -TSH 0.323, check free T 4 and Free T3  B12 474 (low Normal) -Serum folate is low at 4.5 -Give multivitamin with folate and B12 -Screened by PT with no further needs identified     Status is: Inpatient    Disposition: The patient is from: Home  Anticipated d/c is to: TBD      Discharge Condition: ***  Follow UP   Follow-up Information     Omie Bickers, MD. Schedule an appointment as soon as possible for a visit in 1 week(s).   Specialty: Internal Medicine Contact information: [address] [place] [address] [phone]                  Consults obtained - ***  Diet and Activity recommendation:  As advised  Discharge Instructions    **** Discharge Instructions     Call MD for:  difficulty breathing, headache or visual disturbances   Complete by: As directed    Call MD for:  persistant dizziness or light-headedness   Complete by: As directed    Call MD for:  persistant nausea and vomiting   Complete by: As directed    Call MD for:  temperature >100.4   Complete by: As directed    Diet - low sodium heart healthy   Complete by: As directed    Discharge instructions   Complete by: As directed    1) complete abstinence from tobacco strongly advised--okay to use over-the-counter nicotine patch to help you quit smoking 2) please take medications as prescribed--- please note that there has been some changes to your medications 3) follow-up with primary care physician in 1 to 2 weeks for recheck and reevaluation   For home use only DME Nebulizer machine   Complete by: As directed    Patient needs a nebulizer to treat with the following condition: COPD (chronic obstructive pulmonary disease) (HCC)   Length of Need: Lifetime   Additional equipment  included:  Administration kit Filter     Increase activity slowly   Complete by: As directed          Discharge Medications     Allergies as of 01/03/2024       Reactions   Asa [aspirin] Other (See Comments)   Severe nosebleeds in the past.    Morphine And Codeine Other (See Comments)   Hallucinations/attempt to elope from the hospital   Influenza Virus Vaccine Rash        Medication List     TAKE these medications    acetaminophen  325 MG tablet Commonly known as: TYLENOL  Take 2 tablets (650 mg total) by mouth every 6 (six) hours as needed for mild pain (pain score 1-3) or fever (or Fever >/= 101). What changed:  medication strength how much to take reasons to take this   albuterol  108 (90 Base) MCG/ACT inhaler Commonly known as: VENTOLIN  HFA Inhale 2 puffs into the lungs every 6 (six) hours as needed for wheezing or shortness of breath.   albuterol  (2.5 MG/3ML) 0.083% nebulizer solution Commonly known as: PROVENTIL  Take 3 mLs (2.5 mg total) by nebulization every 6 (six) hours as needed for wheezing or shortness of breath.   allopurinol  300 MG tablet Commonly known as: ZYLOPRIM  Take 300 mg by mouth at bedtime.   ALPRAZolam  1 MG tablet Commonly known as: XANAX  Take 0.5 mg by mouth at bedtime.   amLODipine  10 MG tablet Commonly known as: NORVASC  Take 0.5 tablets (5 mg total) by mouth daily. What changed:  how much to take when to take this   atorvastatin 10 MG tablet Commonly known as: LIPITOR Take 10 mg by mouth daily.   Avastin  100 MG/4ML Soln Generic drug: Bevacizumab  100 mg as directed. Getting every 24 weeks Eye injection   azithromycin 500  MG tablet Commonly known as: ZITHROMAX Take 1 tablet (500 mg total) by mouth daily for 3 days.   carbidopa -levodopa  25-100 MG tablet Commonly known as: SINEMET  IR Take 1 tablet by mouth in the morning and at bedtime.   Compressor/Nebulizer Misc 1 Units by Does not apply route daily as needed.    cyanocobalamin 1000 MCG tablet Take 1 tablet (1,000 mcg total) by mouth daily. Start taking on: January 04, 2024   folic acid 1 MG tablet Commonly known as: FOLVITE Take 1 tablet (1 mg total) by mouth daily. Start taking on: January 04, 2024   guaiFENesin  600 MG 12 hr tablet Commonly known as: MUCINEX  Take 1 tablet (600 mg total) by mouth 2 (two) times daily.   lactulose 10 GM/15ML solution Commonly known as: CHRONULAC Take 30 mLs (20 g total) by mouth daily as needed for mild constipation or moderate constipation.   lubiprostone 8 MCG capsule Commonly known as: AMITIZA Take 8 mcg by mouth 2 (two) times daily with a meal.   melatonin 5 MG Tabs Take 5 mg by mouth at bedtime as needed (sleep).   nicotine 21 mg/24hr patch Commonly known as: NICODERM CQ - dosed in mg/24 hours Place 1 patch (21 mg total) onto the skin daily. Start taking on: January 04, 2024   omeprazole 20 MG capsule Commonly known as: PRILOSEC Take 20 mg by mouth daily before breakfast.   ondansetron  4 MG disintegrating tablet Commonly known as: ZOFRAN -ODT Take 4 mg by mouth 4 (four) times daily as needed for nausea or vomiting.   polyethylene glycol 17 g packet Commonly known as: MIRALAX  / GLYCOLAX  Take 17 g by mouth daily. What changed: when to take this   predniSONE 20 MG tablet Commonly known as: DELTASONE Take 2 tablets (40 mg total) by mouth daily with breakfast for 5 days.   senna 8.6 MG Tabs tablet Commonly known as: SENOKOT Take 2 tablets (17.2 mg total) by mouth at bedtime. What changed: when to take this   sodium chloride  0.65 % Soln nasal spray Commonly known as: OCEAN Place 1 spray into both nostrils daily as needed for congestion.               Durable Medical Equipment  (From admission, onward)           Start     Ordered   01/03/24 0000  For home use only DME Nebulizer machine       Question Answer Comment  Patient needs a nebulizer to treat with the following condition  COPD (chronic obstructive pulmonary disease) (HCC)   Length of Need Lifetime   Additional equipment included Administration kit   Additional equipment included Filter      01/03/24 1414            Major procedures and Radiology Reports - PLEASE review detailed and final reports for all details, in brief -   ***  ECHOCARDIOGRAM COMPLETE Result Date: 01/01/2024    ECHOCARDIOGRAM REPORT   Patient Name:   KEIDRICK MURTY Montella Date of Exam: 01/01/2024 Medical Rec #:  [id number]       Height:       69.0 in Accession #:    [id number]      Weight:       149.0 lb Date of Birth:  29-Jan-1946       BSA:          1.823 m Patient Age:    60 years  BP:           108/63 mmHg Patient Gender: M               HR:           105 bpm. Exam Location:  [facility] Procedure: 2D Echo, 3D Echo, Cardiac Doppler, Color Doppler and Strain Analysis            (Both Spectral and Color Flow Doppler were utilized during            procedure). Indications:     CHF- Acute Diastolic I50.31  History:         Patient has no prior history of Echocardiogram examinations.                  COPD, Signs/Symptoms:Shortness of Breath; Risk                  Factors:Hypertension.  Sonographer:     Kip Peon RDCS Referring Phys:  [id number] Fast Diagnosing Phys: Carson Clara MD  Sonographer Comments: Global longitudinal strain was attempted. IMPRESSIONS  1. Left ventricular ejection fraction, by estimation, is 50 to 55%. Left ventricular ejection fraction by 3D volume is 53 %. The left ventricle has low normal function. The left ventricle has no regional wall motion abnormalities. Left ventricular diastolic parameters are indeterminate.  2. Right ventricular systolic function is normal. The right ventricular size is normal. There is normal pulmonary artery systolic pressure. The estimated right ventricular systolic pressure is 26.4 mmHg.  3. The mitral valve is normal in structure. Trivial mitral valve regurgitation.  4. The aortic  valve is tricuspid. Aortic valve regurgitation is moderate. No aortic stenosis is present.  5. The inferior vena cava is normal in size with greater than 50% respiratory variability, suggesting right atrial pressure of 3 mmHg. FINDINGS  Left Ventricle: Left ventricular ejection fraction, by estimation, is 50 to 55%. Left ventricular ejection fraction by 3D volume is 53 %. The left ventricle has low normal function. The left ventricle has no regional wall motion abnormalities. The left ventricular internal cavity size was normal in size. There is no left ventricular hypertrophy. Left ventricular diastolic parameters are indeterminate. Right Ventricle: The right ventricular size is normal. No increase in right ventricular wall thickness. Right ventricular systolic function is normal. There is normal pulmonary artery systolic pressure. The tricuspid regurgitant velocity is 2.42 m/s, and  with an assumed right atrial pressure of 3 mmHg, the estimated right ventricular systolic pressure is 26.4 mmHg. Left Atrium: Left atrial size was normal in size. Right Atrium: Right atrial size was normal in size. Prominent Chiari network. Pericardium: There is no evidence of pericardial effusion. Mitral Valve: The mitral valve is normal in structure. Trivial mitral valve regurgitation. Tricuspid Valve: The tricuspid valve is normal in structure. Tricuspid valve regurgitation is trivial. Aortic Valve: The aortic valve is tricuspid. Aortic valve regurgitation is moderate. Aortic regurgitation PHT measures 292 msec. No aortic stenosis is present. Pulmonic Valve: The pulmonic valve was grossly normal. Pulmonic valve regurgitation is trivial. Aorta: The aortic root is normal in size and structure. Venous: The inferior vena cava is normal in size with greater than 50% respiratory variability, suggesting right atrial pressure of 3 mmHg. IAS/Shunts: The interatrial septum was not well visualized. Additional Comments: 3D was performed not  requiring image post processing on an independent workstation and was normal.  LEFT VENTRICLE PLAX 2D LVIDd:         5.50 cm  Diastology LVIDs:         4.00 cm         LV e' medial:  6.20 cm/s LV PW:         0.90 cm         LV e' lateral: 8.16 cm/s LV IVS:        0.80 cm LVOT diam:     2.00 cm LV SV:         60              3D Volume EF LV SV Index:   33              LV 3D EF:    Left LVOT Area:     3.14 cm                     ventricul                                             ar                                             ejection                                             fraction                                             by 3D                                             volume is                                             53 %.                                 3D Volume EF:                                3D EF:        53 %                                LV EDV:       130 ml                                LV ESV:       62 ml  LV SV:        69 ml RIGHT VENTRICLE             IVC RV Basal diam:  3.40 cm     IVC diam: 1.70 cm RV S prime:     17.00 cm/s TAPSE (M-mode): 2.0 cm LEFT ATRIUM             Index        RIGHT ATRIUM           Index LA diam:        3.20 cm 1.76 cm/m   RA Area:     14.40 cm LA Vol (A2C):   40.0 ml 21.94 ml/m  RA Volume:   37.10 ml  20.35 ml/m LA Vol (A4C):   32.5 ml 17.83 ml/m LA Biplane Vol: 36.8 ml 20.19 ml/m  AORTIC VALVE LVOT Vmax:   115.00 cm/s LVOT Vmean:  67.600 cm/s LVOT VTI:    0.192 m AI PHT:      292 msec  AORTA Ao Root diam: 3.80 cm Ao Asc diam:  3.80 cm TRICUSPID VALVE TR Peak grad:   23.4 mmHg TR Vmax:        242.00 cm/s  SHUNTS Systemic VTI:  0.19 m Systemic Diam: 2.00 cm Carson Clara MD Electronically signed by Carson Clara MD Signature Date/Time: 01/01/2024/11:28:30 AM    Final (Updated)    DG Chest Port 1 View Result Date: 12/31/2023 CLINICAL DATA:  sob EXAM: PORTABLE CHEST 1 VIEW COMPARISON:  Chest x-ray  10/24/2019 FINDINGS: The heart and mediastinal contours are unchanged. Atherosclerotic plaque. No focal consolidation. No pulmonary edema. Nonspecific blunting of bilateral costophrenic angles. No significant pleural effusion. No pneumothorax. No acute osseous abnormality. IMPRESSION: 1. No active disease. 2.  Aortic Atherosclerosis (ICD10-I70.0). Electronically Signed   By: Morgane  Naveau M.D.   On: 12/31/2023 16:14   CT ABDOMEN PELVIS W CONTRAST Result Date: 12/29/2023 CLINICAL DATA:  Chronic constipation. EXAM: CT ABDOMEN AND PELVIS WITH CONTRAST TECHNIQUE: Multidetector CT imaging of the abdomen and pelvis was performed using the standard protocol following bolus administration of intravenous contrast. RADIATION DOSE REDUCTION: This exam was performed according to the departmental dose-optimization program which includes automated exposure control, adjustment of the mA and/or kV according to patient size and/or use of iterative reconstruction technique. CONTRAST:  OMNIPAQUE  IOHEXOL  300 MG/ML  SOLN COMPARISON:  CT abdomen pelvis dated 10/24/2019. FINDINGS: Lower chest: The visualized lung bases are clear. There is coronary vascular calcification. No intra-abdominal free air or free fluid. Hepatobiliary: Subcentimeter hepatic hypodense lesions are too small to characterize. No biliary dilatation. Cholecystectomy. Pancreas: Unremarkable. No pancreatic ductal dilatation or surrounding inflammatory changes. Spleen: Normal in size without focal abnormality. Adrenals/Urinary Tract: Bilateral adrenal thickening may represent hyperplasia. Multiple left renal cysts. There is no hydronephrosis on either side. There is symmetric enhancement and excretion of contrast by both kidneys. The visualized ureters and urinary bladder appear unremarkable. Stomach/Bowel: There is sigmoid diverticulosis. There is no bowel obstruction or active inflammation. There is moderate stool throughout the colon. Appendectomy.  Vascular/Lymphatic: Moderate aortoiliac atherosclerotic disease. There is a 3.4 cm infrarenal abdominal aortic aneurysm. 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 A stent graft noted in the left iliac artery. The iliac arteries are patent. The IVC is unremarkable. No portal venous gas. There is no adenopathy. Reproductive: The prostate and seminal vesicles are grossly remarkable. Other: None Musculoskeletal: Degenerative changes of the spine. No acute osseous pathology. IMPRESSION:  1. No acute intra-abdominal or pelvic pathology. 2. Sigmoid diverticulosis. No bowel obstruction. Moderate stool throughout the colon. 3. A 3.4 cm infrarenal abdominal aortic aneurysm. Recommend follow-up ultrasound every 3 years. (Ref.: J Vasc Surg. 2018; 67:2-77 and J Am Coll Radiol 2013;10(10):789-794.) 4.  Aortic Atherosclerosis (ICD10-I70.0). Electronically Signed   By: Angus Bark M.D.   On: 12/29/2023 14:36    Micro Results   *** Recent Results (from the past 240 hours)  Culture, Respiratory w Gram Stain     Status: None   Collection Time: 12/31/23  4:54 PM   Specimen: SPU  Result Value Ref Range Status   Specimen Description   Final    SPUTUM Performed at [facility], [address] [place], [place] [address]    Special Requests   Final    NONE Performed at [facility], [address] [place], [place] [address]    Gram Stain   Final    ABUNDANT WBC PRESENT, PREDOMINANTLY PMN MODERATE SQUAMOUS EPITHELIAL CELLS PRESENT ABUNDANT GRAM POSITIVE COCCI IN CHAINS MODERATE GRAM POSITIVE COCCI IN CLUSTERS RARE GRAM POSITIVE RODS    Culture   Final    Normal respiratory flora-no Staph aureus or Pseudomonas seen Performed at [facility], [address], [place], [place] [address]    Report Status 01/03/2024 FINAL  Final    Today   Subjective    Arkel Cartwright today has no ***          Patient has been seen and examined prior to discharge   Objective    Blood pressure 107/77, pulse 98, temperature 98.1 F (36.7 C), temperature source Oral, resp. rate (!) 22, height 5' 9 (1.753 m), weight 67.6 kg, SpO2 94%.   Intake/Output Summary (Last 24 hours) at 01/03/2024 1415 Last data filed at 01/03/2024 0510 Gross per 24 hour  Intake 240 ml  Output 300 ml  Net -60 ml    Exam Gen:- Awake Alert, no acute distress *** HEENT:- [place].AT, No sclera icterus Neck-Supple Neck,No JVD,.  Lungs-  CTAB , good air movement bilaterally CV- S1, S2 normal, regular Abd-  +ve B.Sounds, Abd Soft, No tenderness,    Extremity/Skin:- No  edema,   good pulses Psych-affect is appropriate, oriented x3 Neuro-no new focal deficits, no tremors ***   Data Review   CBC w Diff:  Lab Results  Component Value Date   WBC 3.6 (L) 01/01/2024   HGB 15.8 01/01/2024   HCT 48.0 01/01/2024   PLT 172 01/01/2024   LYMPHOPCT 13 12/31/2023   MONOPCT 10 12/31/2023   EOSPCT 1 12/31/2023   BASOPCT 1 12/31/2023    CMP:  Lab Results  Component Value Date   NA 137 01/01/2024   K 3.8 01/01/2024   CL 94 (L) 01/01/2024   CO2 32 01/01/2024   BUN 10 01/01/2024   CREATININE 0.66 01/01/2024   CREATININE 0.77 03/21/2015   PROT 6.7 01/01/2024   ALBUMIN 3.4 (L) 01/01/2024   BILITOT 0.9 01/01/2024   ALKPHOS 83 01/01/2024   AST 28 01/01/2024   ALT 17 01/01/2024  .  Total Discharge time is about 33 minutes  Colin Dawley M.D on 01/03/2024 at 2:15 PM  Go to [email or web address] -  for contact info  [facility] - Office  [phone]

## 2024-01-03 NOTE — Progress Notes (Signed)
 Mobility Specialist Progress Note:    01/03/24 0901  Mobility  Activity Ambulated with assistance in hallway  Level of Assistance Independent  Assistive Device None  Distance Ambulated (ft) 100 ft  Range of Motion/Exercises Active;All extremities  Activity Response Tolerated well  Mobility Referral Yes  Mobility visit 1 Mobility  Mobility Specialist Start Time (ACUTE ONLY) [id number]  Mobility Specialist Stop Time (ACUTE ONLY) 0901  Mobility Specialist Time Calculation (min) (ACUTE ONLY) 19 min   Pt received in be,d agreeable to mobility. Independently able to stand and ambulate with no AD. Tolerated well, SpO2 90-94% on RA throughout. Returned to room, all needs met.   Glinda Lapping Mobility Specialist Please contact via Special educational needs teacher or  Rehab office at [phone]

## 2024-01-03 NOTE — Plan of Care (Signed)

## 2024-01-03 NOTE — Discharge Instructions (Addendum)
 1) complete abstinence from tobacco strongly advised--okay to use over-the-counter nicotine patch to help you quit smoking 2) please take medications as prescribed--- please note that there has been some changes to your medications 3) follow-up with primary care physician in 1 to 2 weeks for recheck and reevaluation 4)Repeat thyroid  function test including TSH and free T3-T4 with the primary care physician in 4 to 6 weeks advised

## 2024-01-03 NOTE — Plan of Care (Signed)
  Problem: Education: Goal: Knowledge of General Education information will improve Description: Including pain rating scale, medication(s)/side effects and non-pharmacologic comfort measures 01/03/2024 1301 by Goldie Later, RN Outcome: Adequate for Discharge 01/03/2024 1043 by Goldie Later, RN Outcome: Progressing   Problem: Health Behavior/Discharge Planning: Goal: Ability to manage health-related needs will improve 01/03/2024 1301 by Goldie Later, RN Outcome: Adequate for Discharge 01/03/2024 1043 by Goldie Later, RN Outcome: Progressing   Problem: Clinical Measurements: Goal: Ability to maintain clinical measurements within normal limits will improve 01/03/2024 1301 by Goldie Later, RN Outcome: Adequate for Discharge 01/03/2024 1043 by Goldie Later, RN Outcome: Progressing Goal: Will remain free from infection 01/03/2024 1301 by Goldie Later, RN Outcome: Adequate for Discharge 01/03/2024 1043 by Goldie Later, RN Outcome: Progressing Goal: Diagnostic test results will improve 01/03/2024 1301 by Goldie Later, RN Outcome: Adequate for Discharge 01/03/2024 1043 by Goldie Later, RN Outcome: Progressing Goal: Respiratory complications will improve 01/03/2024 1301 by Goldie Later, RN Outcome: Adequate for Discharge 01/03/2024 1043 by Goldie Later, RN Outcome: Progressing Goal: Cardiovascular complication will be avoided 01/03/2024 1301 by Goldie Later, RN Outcome: Adequate for Discharge 01/03/2024 1043 by Goldie Later, RN Outcome: Progressing   Problem: Activity: Goal: Risk for activity intolerance will decrease 01/03/2024 1301 by Goldie Later, RN Outcome: Adequate for Discharge 01/03/2024 1043 by Goldie Later, RN Outcome: Progressing   Problem: Nutrition: Goal: Adequate nutrition will be maintained 01/03/2024 1301 by Goldie Later, RN Outcome: Adequate for Discharge 01/03/2024 1043 by Goldie Later, RN Outcome: Progressing   Problem: Coping: Goal: Level of anxiety  will decrease 01/03/2024 1301 by Goldie Later, RN Outcome: Adequate for Discharge 01/03/2024 1043 by Goldie Later, RN Outcome: Progressing   Problem: Elimination: Goal: Will not experience complications related to bowel motility 01/03/2024 1301 by Goldie Later, RN Outcome: Adequate for Discharge 01/03/2024 1043 by Goldie Later, RN Outcome: Progressing Goal: Will not experience complications related to urinary retention 01/03/2024 1301 by Goldie Later, RN Outcome: Adequate for Discharge 01/03/2024 1043 by Goldie Later, RN Outcome: Progressing   Problem: Pain Managment: Goal: General experience of comfort will improve and/or be controlled 01/03/2024 1301 by Goldie Later, RN Outcome: Adequate for Discharge 01/03/2024 1043 by Goldie Later, RN Outcome: Progressing   Problem: Safety: Goal: Ability to remain free from injury will improve 01/03/2024 1301 by Goldie Later, RN Outcome: Adequate for Discharge 01/03/2024 1043 by Goldie Later, RN Outcome: Progressing   Problem: Skin Integrity: Goal: Risk for impaired skin integrity will decrease 01/03/2024 1301 by Goldie Later, RN Outcome: Adequate for Discharge 01/03/2024 1043 by Goldie Later, RN Outcome: Progressing

## 2024-01-03 NOTE — Care Management Important Message (Signed)
 Important Message  Patient Details  Name: Brett Russell MRN: [id number] Date of Birth: Mar 11, 1946   Important Message Given:  Yes - Medicare IM     Aniya Jolicoeur L Oluwatobi Ruppe 01/03/2024, 2:46 PM

## 2024-01-04 ENCOUNTER — Telehealth: Payer: Self-pay

## 2024-01-04 NOTE — Transitions of Care (Post Inpatient/ED Visit) (Signed)
   01/04/2024  Name: Brett Russell MRN: [id number] DOB: 02-Aug-1945  Today's TOC FU Call Status: Today's TOC FU Call Status:: Unsuccessful Call (1st Attempt) Unsuccessful Call (1st Attempt) Date: 01/04/24  Attempted to reach the patient regarding the most recent Inpatient/ED visit.  Follow Up Plan: Additional outreach attempts will be made to reach the patient to complete the Transitions of Care (Post Inpatient/ED visit) call.   Nizhoni Parlow J. Chioke Noxon RN, MSN [facility], [facility] RN Care Manager Direct Dial: [phone]  Fax: [phone] Website: [email or web address]

## 2024-01-05 ENCOUNTER — Telehealth: Payer: Self-pay

## 2024-01-05 NOTE — Transitions of Care (Post Inpatient/ED Visit) (Addendum)
   01/05/2024  Name: Brett Russell MRN: [id number] DOB: 1946/01/02  Today's TOC FU Call Status: Today's TOC FU Call Status:: Unsuccessful Call (2nd Attempt) Unsuccessful Call (2nd Attempt) Date: 01/05/24  Attempted to reach the patient regarding the most recent Inpatient/ED visit.  Follow Up Plan: Additional outreach attempts will be made to reach the patient to complete the Transitions of Care (Post Inpatient/ED visit) call.   Lasharn Bufkin J. Rudi Knippenberg RN, MSN [facility], [facility] RN Care Manager Direct Dial: [phone]  Fax: [phone] Website: [email or web address]

## 2024-01-06 ENCOUNTER — Telehealth: Payer: Self-pay

## 2024-01-06 NOTE — Transitions of Care (Post Inpatient/ED Visit) (Signed)
   01/06/2024  Name: Brett Russell MRN: [id number] DOB: [birth year]  Today's TOC FU Call Status: Today's TOC FU Call Status:: Unsuccessful Call (3rd Attempt) Unsuccessful Call (3rd Attempt) Date: 01/06/24  Attempted to reach the patient regarding the most recent Inpatient/ED visit.  Follow Up Plan: No further outreach attempts will be made at this time. We have been unable to contact the patient.  Taylan Mayhan J. Marri Mcneff RN, MSN [facility], [facility] RN Care Manager Direct Dial: [phone]  Fax: [phone] Website: [email or web address]

## 2024-01-10 DIAGNOSIS — E538 Deficiency of other specified B group vitamins: Secondary | ICD-10-CM | POA: Diagnosis not present

## 2024-01-10 DIAGNOSIS — G20A1 Parkinson's disease without dyskinesia, without mention of fluctuations: Secondary | ICD-10-CM | POA: Diagnosis not present

## 2024-01-10 DIAGNOSIS — J441 Chronic obstructive pulmonary disease with (acute) exacerbation: Secondary | ICD-10-CM | POA: Diagnosis not present

## 2024-01-10 DIAGNOSIS — I499 Cardiac arrhythmia, unspecified: Secondary | ICD-10-CM | POA: Diagnosis not present

## 2024-01-16 DIAGNOSIS — H5213 Myopia, bilateral: Secondary | ICD-10-CM | POA: Diagnosis not present

## 2024-01-16 DIAGNOSIS — Z961 Presence of intraocular lens: Secondary | ICD-10-CM | POA: Diagnosis not present

## 2024-01-16 DIAGNOSIS — H26493 Other secondary cataract, bilateral: Secondary | ICD-10-CM | POA: Diagnosis not present

## 2024-01-16 DIAGNOSIS — H353122 Nonexudative age-related macular degeneration, left eye, intermediate dry stage: Secondary | ICD-10-CM | POA: Diagnosis not present

## 2024-01-16 DIAGNOSIS — H353211 Exudative age-related macular degeneration, right eye, with active choroidal neovascularization: Secondary | ICD-10-CM | POA: Diagnosis not present

## 2024-02-08 DIAGNOSIS — H26492 Other secondary cataract, left eye: Secondary | ICD-10-CM | POA: Diagnosis not present

## 2024-03-09 DIAGNOSIS — R600 Localized edema: Secondary | ICD-10-CM | POA: Diagnosis not present

## 2024-03-15 DIAGNOSIS — H35033 Hypertensive retinopathy, bilateral: Secondary | ICD-10-CM | POA: Diagnosis not present

## 2024-03-15 DIAGNOSIS — H26493 Other secondary cataract, bilateral: Secondary | ICD-10-CM | POA: Diagnosis not present

## 2024-03-15 DIAGNOSIS — H35372 Puckering of macula, left eye: Secondary | ICD-10-CM | POA: Diagnosis not present

## 2024-03-15 DIAGNOSIS — H353211 Exudative age-related macular degeneration, right eye, with active choroidal neovascularization: Secondary | ICD-10-CM | POA: Diagnosis not present

## 2024-03-15 DIAGNOSIS — H43393 Other vitreous opacities, bilateral: Secondary | ICD-10-CM | POA: Diagnosis not present

## 2024-03-15 DIAGNOSIS — H43813 Vitreous degeneration, bilateral: Secondary | ICD-10-CM | POA: Diagnosis not present

## 2024-03-15 DIAGNOSIS — H353122 Nonexudative age-related macular degeneration, left eye, intermediate dry stage: Secondary | ICD-10-CM | POA: Diagnosis not present

## 2024-04-22 DIAGNOSIS — H6692 Otitis media, unspecified, left ear: Secondary | ICD-10-CM | POA: Diagnosis not present

## 2024-04-22 DIAGNOSIS — H6092 Unspecified otitis externa, left ear: Secondary | ICD-10-CM | POA: Diagnosis not present

## 2024-05-14 ENCOUNTER — Ambulatory Visit: Admitting: Neurology

## 2024-05-14 ENCOUNTER — Encounter: Payer: Self-pay | Admitting: Neurology

## 2024-05-14 VITALS — BP 119/66 | HR 76 | Ht 68.0 in | Wt 155.4 lb

## 2024-05-14 DIAGNOSIS — Z789 Other specified health status: Secondary | ICD-10-CM

## 2024-05-14 DIAGNOSIS — I499 Cardiac arrhythmia, unspecified: Secondary | ICD-10-CM

## 2024-05-14 DIAGNOSIS — Z8669 Personal history of other diseases of the nervous system and sense organs: Secondary | ICD-10-CM | POA: Diagnosis not present

## 2024-05-14 DIAGNOSIS — R251 Tremor, unspecified: Secondary | ICD-10-CM | POA: Diagnosis not present

## 2024-05-14 DIAGNOSIS — Z9181 History of falling: Secondary | ICD-10-CM

## 2024-05-14 NOTE — Patient Instructions (Signed)
 You have a rather mild tremor of both hands.  As explained, I do not see any typical signs or symptoms of parkinson's like disease or what we call parkinsonism.  For your tremor, I would not recommend any new medications at this time.  For now, you can continue with the levodopa  1 pill twice daily. We will proceed with a so-called brain DaT scan: This is a specialized brain scan designed to help with diagnosis of tremor disorders. A radioactive marker gets injected and the uptake is measured in the brain and compared to normal controls and right side is compared to the left, a change in uptake can help with diagnosis of certain tremor disorders. A brain MRI on the other hand is a brain scan that helps look at the brain structure in more detail overall and look for age-related changes, blood vessel related changes and look for stroke and volume loss which we call atrophy.  Please remember, that any kind of tremor may be exacerbated by anxiety, anger, nervousness, excitement, dehydration, sleep deprivation, thyroid  dysfunction, by caffeine, and low blood sugar values or blood sugar fluctuations. Some medications can exacerbate tremors, this includes certain asthma or COPD medications and certain antidepressants.  Please reduce your caffeine consumption, stay well-hydrated with water . Please get in touch with your primary care today as you have had some electrolyte disturbances, including low calcium  and you have an irregular heartbeat today on my examination as well as irregular pulse.  I recommend you get a formal EKG through your primary care and additional blood work and consider seeing a cardiologist next if your PCP recommends it.

## 2024-05-14 NOTE — Progress Notes (Signed)
 Subjective:    Patient ID: Brett Russell is a 78 y.o. male.  HPI    True Mar, MD, PhD [facility] [address], [address] [address] [place], [place] [address]  Dear [name],  I saw your patient, Brett Russell, upon your kind request in my neurologic clinic today for evaluation of his parkinsonism.  The patient is accompanied by his middle daughter, [name], today.  As you know, Brett Russell is a 78 year old male with an underlying medical history of hyperlipidemia, gout, hypertension, aortic atherosclerosis, AAA with status post abdominal aortic aneurysm repair, History of polio, current smoking, kidney stones, macular degeneration, allergic rhinitis, COPD, reflux disease, chronic constipation, chronic low back pain, history of kidney cancer, and colonic polyps, status post multiple surgeries including kidney tumor removal, aortic stent placement, cholecystectomy, polypectomy, who reports a history of Parkinson's disease, diagnosed when he was in rehab for alcohol use disorder back in 2006.  He has not had any alcohol since.  He has been on levodopa  since.  He originally took 1 pill 3 times daily but after about a year he reduced it to 1 pill twice daily and has maintained it at this level since.  He has not noticed much in the way of progression of his symptoms but does feel that his balance has become worse over time.  He has fallen.  No family history of tremors, both parents did not have a tremor, he had a younger brother who died at age 12, he has another brother and 1 sister, neither with tremors.  His 3 daughters do not have any tremors. He was previously diagnosed with Parkinson's disease and had previously seen Dr. Milton, but per daughter, he saw him only once.  He has been on levodopa  therapy.  I reviewed your office note from 01/10/2024.  He is currently on multiple medications including Xanax  1 mg 3 times daily and carbidopa -levodopa  25-100 mg strength 1 pill  twice daily.  He has fallen.  He fell about 5 or 6 months ago.  He is cautious with walking but does not typically use a walking aid.  He does not always hydrate well with water , drinks about 2 L of send of or in soda with caffeine on an average day. He still smokes, about half a pack per day or less.  He is trying to quit.  He lives with his youngest daughter and her daughter who is 49.  [name] and her older sister live close by with their families.  He has had weight loss, he worked on it on his own, he lost from about 175 pounds to down to 140 over the course of a couple years and then now is back up to 155.  He had possible A-fib when he was in the hospital per daughter but was not advised to seek consultation with cardiology as understand.  I checked the discharge summary and any evidence for A-fib which was not mentioned in his discharge summary.  He had an echocardiogram on 01/01/2024 and irregular heart rhythm was not mentioned at the time.  His EF was 50-55 and he had moderate mitral regurgitation.  He has not progressed very much when it comes to his tremor.  He is not as good with his balance over time and his handwriting has changed over time.   Prior neurology records are not available for my review today.  He had a remote head CT without contrast through [facility] emergency room on 08/08/2010 and  I reviewed the results: IMPRESSION:  1. No acute intracranial findings. No evidence of calvarial fracture.   2. There is soft tissue injury to the frontal scalp which appears asymmetric to the right. Underlying right maxillary sinus disease is present with possible mucous retention cyst.  He was admitted to the hospital in June 2025 for COPD exacerbation.  I reviewed the emergency room as well as hospital records from his admission from 12/31/2023 through 01/03/2024.  He was needed with oxygen , IV Solu-Medrol , DuoNeb, ceftriaxone  and Mucinex .  Smoking cessation was advised and nicotine  patch was  prescribed.  He was advised to continue with Senokot, Amitiza, and lactulose  as well as MiraLAX  for chronic constipation.  He was evaluated by PT with no further needs identified at the time.  His Past Medical History Is Significant For: Past Medical History:  Diagnosis Date   AAA (abdominal aortic aneurysm)    Allergy    takes Allegra daily   Anxiety    takes Xanax  nightly   Aortic aneurysm 2007   Arthritis    COPD (chronic obstructive pulmonary disease) (HCC)    Enlarged prostate    slightly   GERD (gastroesophageal reflux disease)    takes Omeprazole daily   Gout    takes Allopurinol  nightly   History of bronchitis    many yrs ago   History of colon polyps    benign   History of kidney stones    hx of   Hypertension    takes Amlodipine  daily   Insomnia    takes Melatonin nightly   Macular degeneration    wet-right eye .Injection of AvAStin  every 10 wks   Parkinson disease (HCC) 2006   takes Sinemet  daily   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    His Past Surgical History Is Significant For: Past Surgical History:  Procedure Laterality Date   ABDOMINAL AORTIC ANEURYSM REPAIR  2007   ABDOMINAL AORTIC ENDOVASCULAR STENT GRAFT Left 12/03/2016   Procedure: INSERTION OF LEFT COMMON  ILIAC STENT-LEFT INTERNAL ILIAC  ARTERY COILING;  Surgeon: Oris Krystal FALCON, MD;  Location: [facility] OR;  Service: Vascular;  Laterality: Left;   APPENDECTOMY     CHOLECYSTECTOMY     COLONOSCOPY N/A 11/29/2012   Procedure: COLONOSCOPY;  Surgeon: Claudis RAYMOND Rivet, MD;  Location: [facility] ENDO SUITE;  Service: Endoscopy;  Laterality: N/A;  1200-moved to 930  Ann notified pt   COLONOSCOPY N/A 09/20/2018   Procedure: COLONOSCOPY;  Surgeon: Rivet Claudis RAYMOND, MD;  Location: [facility];  Service: Endoscopy;  Laterality: N/A;  2:25   COLONOSCOPY WITH PROPOFOL  N/A 10/13/2021   Procedure: COLONOSCOPY WITH PROPOFOL ;  Surgeon: Eartha Angelia Sieving, MD;  Location: [facility] END[facility];  Service:  Gastroenterology;  Laterality: N/A;  115   CYST EXCISION N/A 01/07/2020   Procedure: excision of scrotal sebaceous cyst;  Surgeon: Sherrilee Belvie CROME, MD;  Location: [facility] ORS;  Service: Urology;  Laterality: N/A;   KIDNEY SURGERY  2007   Tumor removed   POLYPECTOMY  09/20/2018   Procedure: POLYPECTOMY;  Surgeon: Rivet Claudis RAYMOND, MD;  Location: [facility];  Service: Endoscopy;;  colon   POLYPECTOMY  10/13/2021   Procedure: POLYPECTOMY INTESTINAL;  Surgeon: Eartha Angelia Sieving, MD;  Location: [facility];  Service: Gastroenterology;;   Right knee     Cartilage removed at age 67    His Family History Is Significant For: Family History  Problem Relation Age of Onset  Kidney disease Father    Heart disease Father    AAA (abdominal aortic aneurysm) Father    Healthy Brother    Healthy Daughter    Constipation Daughter    Healthy Daughter     His Social History Is Significant For: Social History   Socioeconomic History   Marital status: Divorced    Spouse name: Not on file   Number of children: Not on file   Years of education: Not on file   Highest education level: Not on file  Occupational History   Not on file  Tobacco Use   Smoking status: Every Day    Current packs/day: 0.50    Average packs/day: 1 pack/day for 54.8 years (54.3 ttl pk-yrs)    Types: Cigarettes    Start date: 08/11/1969    Passive exposure: Never   Smokeless tobacco: Never  Vaping Use   Vaping status: Never Used  Substance and Sexual Activity   Alcohol use: No    Alcohol/week: 0.0 standard drinks of alcohol   Drug use: No   Sexual activity: Not Currently  Other Topics Concern   Not on file  Social History Narrative   Pt lives with family    Retired    Chief Executive Officer Drivers of Corporate Investment Banker Strain: Not on file  Food Insecurity: No Food Insecurity (12/31/2023)   Hunger Vital Sign    Worried About Running Out of Food in the Last Year: Never true    Ran Out of Food in the Last  Year: Never true  Transportation Needs: No Transportation Needs (12/31/2023)   PRAPARE - Administrator, Civil Service (Medical): No    Lack of Transportation (Non-Medical): No  Physical Activity: Not on file  Stress: Not on file  Social Connections: Socially Isolated (12/31/2023)   Social Connection and Isolation Panel    Frequency of Communication with Friends and Family: More than three times a week    Frequency of Social Gatherings with Friends and Family: More than three times a week    Attends Religious Services: Never    Database Administrator or Organizations: No    Attends Banker Meetings: Never    Marital Status: Divorced    His Allergies Are:  Allergies  Allergen Reactions   Asa [Aspirin] Other (See Comments)    Severe nosebleeds in the past.    Morphine And Codeine Other (See Comments)    Hallucinations/attempt to elope from the hospital   Influenza Virus Vaccine Rash  :   His Current Medications Are:  Outpatient Encounter Medications as of 05/14/2024  Medication Sig   acetaminophen  (TYLENOL ) 325 MG tablet Take 2 tablets (650 mg total) by mouth every 6 (six) hours as needed for mild pain (pain score 1-3) or fever (or Fever >/= 101).   albuterol  (PROVENTIL ) (2.5 MG/3ML) 0.083% nebulizer solution Take 3 mLs (2.5 mg total) by nebulization every 6 (six) hours as needed for wheezing or shortness of breath.   albuterol  (VENTOLIN  HFA) 108 (90 Base) MCG/ACT inhaler Inhale 2 puffs into the lungs every 6 (six) hours as needed for wheezing or shortness of breath.   allopurinol  (ZYLOPRIM ) 300 MG tablet Take 300 mg by mouth at bedtime.    ALPRAZolam  (XANAX ) 1 MG tablet Take 0.5 mg by mouth at bedtime.   amLODipine  (NORVASC ) 10 MG tablet Take 0.5 tablets (5 mg total) by mouth daily.   atorvastatin  (LIPITOR) 10 MG tablet Take 10 mg by mouth daily.  b complex vitamins capsule Take 1 capsule by mouth daily.   Bevacizumab  (AVASTIN ) 100 MG/4ML SOLN 100 mg as  directed. Getting every 24 weeks Eye injection   carbidopa -levodopa  (SINEMET  IR) 25-100 MG per tablet Take 1 tablet by mouth in the morning and at bedtime.    cyanocobalamin  1000 MCG tablet Take 1 tablet (1,000 mcg total) by mouth daily.   folic acid  (FOLVITE ) 1 MG tablet Take 1 tablet (1 mg total) by mouth daily.   guaiFENesin  (MUCINEX ) 600 MG 12 hr tablet Take 1 tablet (600 mg total) by mouth 2 (two) times daily.   lactulose  (CHRONULAC ) 10 GM/15ML solution Take 30 mLs (20 g total) by mouth daily as needed for mild constipation or moderate constipation.   lubiprostone (AMITIZA) 8 MCG capsule Take 8 mcg by mouth 2 (two) times daily with a meal.   melatonin 5 MG TABS Take 5 mg by mouth at bedtime as needed (sleep).    Nebulizers (COMPRESSOR/NEBULIZER) MISC 1 Units by Does not apply route daily as needed.   nicotine  (NICODERM CQ  - DOSED IN MG/24 HOURS) 21 mg/24hr patch Place 1 patch (21 mg total) onto the skin daily.   nystatin  (MYCOSTATIN ) 100000 UNIT/ML suspension Take by mouth.   omeprazole (PRILOSEC) 20 MG capsule Take 20 mg by mouth daily before breakfast.    ondansetron  (ZOFRAN -ODT) 4 MG disintegrating tablet Take 4 mg by mouth 4 (four) times daily as needed for nausea or vomiting. (Patient taking differently: Take 4 mg by mouth as needed for nausea or vomiting.)   polyethylene glycol (MIRALAX  / GLYCOLAX ) 17 g packet Take 17 g by mouth daily.   senna (SENOKOT) 8.6 MG TABS tablet Take 2 tablets (17.2 mg total) by mouth at bedtime.   sodium chloride  (OCEAN) 0.65 % SOLN nasal spray Place 1 spray into both nostrils daily as needed for congestion.   umeclidinium-vilanterol (ANORO ELLIPTA ) 62.5-25 MCG/ACT AEPB Inhale 1 puff into the lungs daily.   No facility-administered encounter medications on file as of 05/14/2024.  :   Review of Systems:  Out of a complete 14 point review of systems, all are reviewed and negative with the exception of these symptoms as listed below:  Review of  Systems  Objective:  Neurological Exam  Physical Exam Physical Examination:   Vitals:   05/14/24 0758  BP: 119/66  Pulse: 76    General Examination: The patient is a very pleasant 78 y.o. male in no acute distress. He appears deconditioned, well-groomed.  Good spirits.   HEENT: Normocephalic, atraumatic, pupils are equal, round and reactive to light, extraocular tracking is good without limitation to gaze excursion or nystagmus noted. No photophobia. No corrective eye glasses in place. Hearing is grossly intact.  Face is symmetric with normal facial animation and no obvious decrease in eye blink rate.  No significant tremor in the face. Speech is raspy but without dysarthria. There is no hypophonia. There is no lip, neck/head, jaw or voice tremor. Neck is supple with full range of passive and active motion. There are no carotid bruits on auscultation.  Airway/Oropharynx exam reveals: Mouth dryness is noted.   Chest: Clear to auscultation without wheezing, rhonchi or crackles noted.  Heart: S1+S2+0, irregularly irregular without any obvious murmur noted.  Abdomen: Soft, non-tender and non-distended.  Extremities: There is no pitting edema in the distal lower extremities bilaterally.   Skin: Warm and dry without trophic changes noted.   Musculoskeletal: exam reveals abnormal upper body curvature, left shoulder higher compared to right, prominent  arthritic changes in both hands, missing fingertip right index finger from a remote childhood injury.     Neurologically:  Mental status: The patient is awake, alert and oriented in all 4 spheres. His immediate and remote memory, attention, language skills and fund of knowledge are appropriate. There is no evidence of aphasia, agnosia, apraxia or anomia. Speech is clear with normal prosody and enunciation. Thought process is linear. Mood is normal and affect is normal.  Cranial nerves II - XII are as described above under HEENT exam.   On  Archimedes spiral drawing he has insecurity and mild trembling with the left hand, minimal trembling with the right hand.  Handwriting with the right hand is tremulous slightly, legible, on the smaller side but fairly consistent in size, not micrographic.  Motor exam: Normal bulk, strength and tone is noted.  There is no cogwheeling.  No obvious resting tremor.  He has a mild action tremor in both upper extremities, slightly worse on the left and mild postural tremor in both upper extremities, slightly more noticeable on the left.  No lower extremity tremor.   Fine motor skills and coordination: Mildly impaired globally, no lateralization noted.  Cerebellar testing: No dysmetria or intention tremor. There is no truncal or gait ataxia.  Sensory exam: intact to light touch in the upper and lower extremities.  Gait, station and balance: He stands without difficulty, posture is age-appropriate but upper body tilt is noted to the left with left shoulder elevated, scoliosis is not fully excluded.  He walks with fairly good pace and stride length, slightly decreased arm swing on the left.  No telltale shuffling.  Assessment and Plan:  In summary, BENECIO KLUGER is a very pleasant 78 year old male with an underlying medical history of hyperlipidemia, gout, hypertension, aortic atherosclerosis, AAA with status post abdominal aortic aneurysm repair, History of polio, current smoking, kidney stones, macular degeneration, allergic rhinitis, COPD, reflux disease, chronic constipation, chronic low back pain, history of kidney cancer, and colonic polyps, status post multiple surgeries including kidney tumor removal, aortic stent placement, cholecystectomy, polypectomy, who presents for evaluation of his tremor disorder, prior diagnosis of Parkinson's disease or parkinsonism for nearly 20 years.  History, examination, and lack of progression over time speak against idiopathic Parkinson's disease.  On examination today he  has no telltale signs of parkinsonism.  I had a long discussion with the patient and his daughter about this today.  We talked about alternative conditions that can cause tremors including essential tremor, drug-induced tremor, excessive caffeine intake, for example.   I am not convinced he has Parkinson's disease.  I would like to proceed with a DaTscan to help with diagnostic clarification.  I explained the scan at length to the patient and his daughter.  She also spoke to me separately for a health-related concern that pertain to her and that she felt would be detrimental if he knew about it.  We talked about the importance of fall prevention, lifestyle modification, including eating more protein, monitoring his hypocalcemia, hydrating well with water , working on smoking cessation, limiting caffeine.  We talked about tremor triggers and alleviating factors.  He has been on levodopa  therapy, 1 pill twice daily for years.  For now, he is advised that he can continue with it.  He is agreeable to proceeding with a DaTscan.  We will plan a follow-up afterwards.  He is advised to make an appointment with you as soon as possible, and get in touch with your office today  for this to get a formal EKG.  He has an irregularly irregular heart rhythm today and there was some concern for A-fib when he was hospitalized back in June as I understand.  Daughter reports that on the telemetry they had seen episodes of A-fib.  He is not on a blood thinner.  He has not seen cardiology after his hospitalization either.  He may benefit from cardiology consultation.  For now, they are agreeable to getting in touch with your office today.    This was an extended visit of over 60 minutes with copious record review involved, considerable counseling and coordination of care, comprehensive exam and addressing multiple issues.    Below is a summary of my recommendations and our discussion points from today's visit, based on chart review,  history and examination. They were given these instructions verbally during the visit in detail and also in writing in the MyChart after visit summary (AVS), which they can access electronically. << You have a rather mild tremor of both hands.  As explained, I do not see any typical signs or symptoms of parkinson's like disease or what we call parkinsonism.  For your tremor, I would not recommend any new medications at this time.  For now, you can continue with the levodopa  1 pill twice daily. We will proceed with a so-called brain DaT scan: This is a specialized brain scan designed to help with diagnosis of tremor disorders. A radioactive marker gets injected and the uptake is measured in the brain and compared to normal controls and right side is compared to the left, a change in uptake can help with diagnosis of certain tremor disorders. A brain MRI on the other hand is a brain scan that helps look at the brain structure in more detail overall and look for age-related changes, blood vessel related changes and look for stroke and volume loss which we call atrophy.  Please remember, that any kind of tremor may be exacerbated by anxiety, anger, nervousness, excitement, dehydration, sleep deprivation, thyroid  dysfunction, by caffeine, and low blood sugar values or blood sugar fluctuations. Some medications can exacerbate tremors, this includes certain asthma or COPD medications and certain antidepressants.  Please reduce your caffeine consumption, stay well-hydrated with water . Please get in touch with your primary care today as you have had some electrolyte disturbances, including low calcium  and you have an irregular heartbeat today on my examination as well as irregular pulse.  I recommend you get a formal EKG through your primary care and additional blood work and consider seeing a cardiologist next if your PCP recommends it.  >>   Thank you very much for allowing me to participate in the care of this  nice patient. If I can be of any further assistance to you please do not hesitate to call me at [phone].  Sincerely,   True Mar, MD, PhD

## 2024-05-15 DIAGNOSIS — Z23 Encounter for immunization: Secondary | ICD-10-CM | POA: Diagnosis not present

## 2024-05-15 DIAGNOSIS — G4762 Sleep related leg cramps: Secondary | ICD-10-CM | POA: Diagnosis not present

## 2024-05-15 DIAGNOSIS — I499 Cardiac arrhythmia, unspecified: Secondary | ICD-10-CM | POA: Diagnosis not present

## 2024-05-25 ENCOUNTER — Telehealth: Payer: Self-pay | Admitting: Neurology

## 2024-05-25 NOTE — Telephone Encounter (Signed)
 Patient's daughter cancelled dat scan with [facility]ol[facility] and said they wanted to talk to Dr. Buck again before rescheduling.

## 2024-06-01 ENCOUNTER — Other Ambulatory Visit (HOSPITAL_COMMUNITY)

## 2024-06-01 ENCOUNTER — Encounter (HOSPITAL_COMMUNITY)

## 2024-06-07 DIAGNOSIS — E782 Mixed hyperlipidemia: Secondary | ICD-10-CM | POA: Diagnosis not present

## 2024-06-07 DIAGNOSIS — Z85528 Personal history of other malignant neoplasm of kidney: Secondary | ICD-10-CM | POA: Diagnosis not present

## 2024-06-07 DIAGNOSIS — M109 Gout, unspecified: Secondary | ICD-10-CM | POA: Diagnosis not present

## 2024-06-13 ENCOUNTER — Other Ambulatory Visit (HOSPITAL_COMMUNITY): Payer: Self-pay | Admitting: Nurse Practitioner

## 2024-06-13 ENCOUNTER — Encounter (HOSPITAL_COMMUNITY): Payer: Self-pay

## 2024-06-13 DIAGNOSIS — Z122 Encounter for screening for malignant neoplasm of respiratory organs: Secondary | ICD-10-CM

## 2024-06-22 ENCOUNTER — Encounter (INDEPENDENT_AMBULATORY_CARE_PROVIDER_SITE_OTHER): Payer: Self-pay

## 2024-07-03 ENCOUNTER — Ambulatory Visit (HOSPITAL_COMMUNITY)
Admission: RE | Admit: 2024-07-03 | Discharge: 2024-07-03 | Attending: Nurse Practitioner | Admitting: Nurse Practitioner

## 2024-07-03 DIAGNOSIS — I2510 Atherosclerotic heart disease of native coronary artery without angina pectoris: Secondary | ICD-10-CM | POA: Insufficient documentation

## 2024-07-03 DIAGNOSIS — I700 Atherosclerosis of aorta: Secondary | ICD-10-CM | POA: Diagnosis not present

## 2024-07-03 DIAGNOSIS — J439 Emphysema, unspecified: Secondary | ICD-10-CM | POA: Insufficient documentation

## 2024-07-03 DIAGNOSIS — Z122 Encounter for screening for malignant neoplasm of respiratory organs: Secondary | ICD-10-CM | POA: Diagnosis present

## 2024-07-03 DIAGNOSIS — F17210 Nicotine dependence, cigarettes, uncomplicated: Secondary | ICD-10-CM | POA: Insufficient documentation

## 2024-07-03 DIAGNOSIS — I7122 Aneurysm of the aortic arch, without rupture: Secondary | ICD-10-CM | POA: Diagnosis not present

## 2024-07-03 DIAGNOSIS — R918 Other nonspecific abnormal finding of lung field: Secondary | ICD-10-CM | POA: Insufficient documentation

## 2024-07-09 ENCOUNTER — Other Ambulatory Visit (HOSPITAL_COMMUNITY): Payer: Self-pay | Admitting: Nurse Practitioner

## 2024-07-09 DIAGNOSIS — R911 Solitary pulmonary nodule: Secondary | ICD-10-CM

## 2024-07-27 ENCOUNTER — Ambulatory Visit: Attending: Internal Medicine | Admitting: Internal Medicine

## 2024-07-27 ENCOUNTER — Encounter: Payer: Self-pay | Admitting: Internal Medicine

## 2024-07-27 ENCOUNTER — Ambulatory Visit: Attending: Internal Medicine

## 2024-07-27 ENCOUNTER — Encounter: Payer: Self-pay | Admitting: *Deleted

## 2024-07-27 ENCOUNTER — Telehealth: Payer: Self-pay | Admitting: Internal Medicine

## 2024-07-27 ENCOUNTER — Other Ambulatory Visit: Payer: Self-pay | Admitting: Internal Medicine

## 2024-07-27 VITALS — BP 138/80 | HR 90 | Ht 68.0 in | Wt 153.8 lb

## 2024-07-27 DIAGNOSIS — I351 Nonrheumatic aortic (valve) insufficiency: Secondary | ICD-10-CM | POA: Diagnosis not present

## 2024-07-27 DIAGNOSIS — E785 Hyperlipidemia, unspecified: Secondary | ICD-10-CM | POA: Insufficient documentation

## 2024-07-27 DIAGNOSIS — I491 Atrial premature depolarization: Secondary | ICD-10-CM

## 2024-07-27 DIAGNOSIS — I10 Essential (primary) hypertension: Secondary | ICD-10-CM | POA: Diagnosis not present

## 2024-07-27 DIAGNOSIS — I739 Peripheral vascular disease, unspecified: Secondary | ICD-10-CM | POA: Diagnosis not present

## 2024-07-27 DIAGNOSIS — E7849 Other hyperlipidemia: Secondary | ICD-10-CM

## 2024-07-27 NOTE — Progress Notes (Signed)
 "    Cardiology Office Note  Date: 07/27/2024   ID: Brett Russell, DOB [birth year], MRN [id number]  PCP:  Shona Norleen PEDLAR, MD  Cardiologist:  Diannah SHAUNNA Maywood, MD Electrophysiologist:  None   History of Present Illness: Brett Russell is a 79 y.o. male known to have moderate AR, PAD s/p open AAA repair in 2007, L CIA stent graft in 2018, history of polio, HTN, COPD, nicotine  abuse referred to cardiology clinic for evaluation of frequent PACs.  EKG today showed frequent PACs, occasional PVC.  He does not have any symptoms of palpitations or dizziness.  He has a history of COPD.  He was recently admitted to [facility] in 2025 for the management of COPD exacerbation.  He smokes cigarettes.  Previous used to smoke 1 pack/day.  Currently cut back to less than a pack per day.  Does not have any angina.  He has chronic stable DOE.  No recent worsening.  No leg swelling.  I reviewed and discussed echocardiogram findings with the patient today.  Echocardiogram from June 2025 showed low normal LVEF, 50 to 55%, normal RV function, normal pulmonary hypertension, moderate AR and CVP 3 mmHg.  I also reviewed and discussed CT chest lung cancer screening findings with the patient today.  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    Past Medical History:  Diagnosis Date   AAA (abdominal aortic aneurysm)    Allergy    takes Allegra daily   Anxiety    takes Xanax  nightly   Aortic aneurysm 2007   Arthritis    COPD (chronic obstructive pulmonary disease) (HCC)    Enlarged prostate    slightly   GERD (gastroesophageal reflux disease)    takes Omeprazole daily   Gout    takes Allopurinol  nightly   History of bronchitis    many yrs ago   History of colon  polyps    benign   History of kidney stones    hx of   Hypertension    takes Amlodipine  daily   Insomnia    takes Melatonin nightly   Macular degeneration    wet-right eye .Injection of AvAStin  every 10 wks   Parkinson disease (HCC) 2006   takes Sinemet  daily   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    Past Surgical History:  Procedure Laterality Date   ABDOMINAL AORTIC ANEURYSM REPAIR  2007   ABDOMINAL AORTIC ENDOVASCULAR STENT GRAFT Left 12/03/2016   Procedure: INSERTION OF LEFT COMMON  ILIAC STENT-LEFT INTERNAL ILIAC  ARTERY COILING;  Surgeon: Oris Krystal FALCON, MD;  Location: [facility] OR;  Service: Vascular;  Laterality: Left;   APPENDECTOMY     CHOLECYSTECTOMY     COLONOSCOPY N/A 11/29/2012   Procedure: COLONOSCOPY;  Surgeon: Claudis RAYMOND Rivet, MD;  Location: [facility];  Service: Endoscopy;  Laterality: N/A;  1200-moved to 930  Ann notified pt   COLONOSCOPY N/A 09/20/2018   Procedure: COLONOSCOPY;  Surgeon: Rivet Claudis RAYMOND, MD;  Location: [facility];  Service: Endoscopy;  Laterality: N/A;  2:25   COLONOSCOPY WITH PROPOFOL  N/A 10/13/2021   Procedure: COLONOSCOPY WITH PROPOFOL ;  Surgeon: Eartha Angelia Sieving, MD;  Location: [facility];  Service: Gastroenterology;  Laterality: N/A;  115  CYST EXCISION N/A 01/07/2020   Procedure: excision of scrotal sebaceous cyst;  Surgeon: Sherrilee Belvie CROME, MD;  Location: [facility] ORS;  Service: Urology;  Laterality: N/A;   KIDNEY SURGERY  2007   Tumor removed   POLYPECTOMY  09/20/2018   Procedure: POLYPECTOMY;  Surgeon: Golda Claudis PENNER, MD;  Location: [facility];  Service: Endoscopy;;  colon   POLYPECTOMY  10/13/2021   Procedure: POLYPECTOMY INTESTINAL;  Surgeon: Eartha Angelia Sieving, MD;  Location: [facility];  Service: Gastroenterology;;   Right knee     Cartilage removed at age 58    Current Outpatient Medications  Medication Sig Dispense Refill   acetaminophen  (TYLENOL ) 325 MG tablet Take 2 tablets (650 mg  total) by mouth every 6 (six) hours as needed for mild pain (pain score 1-3) or fever (or Fever >/= 101).     albuterol  (PROVENTIL ) (2.5 MG/3ML) 0.083% nebulizer solution Take 3 mLs (2.5 mg total) by nebulization every 6 (six) hours as needed for wheezing or shortness of breath. 84 each 12   albuterol  (VENTOLIN  HFA) 108 (90 Base) MCG/ACT inhaler Inhale 2 puffs into the lungs every 6 (six) hours as needed for wheezing or shortness of breath. 51 each 2   allopurinol  (ZYLOPRIM ) 300 MG tablet Take 300 mg by mouth at bedtime.      ALPRAZolam  (XANAX ) 1 MG tablet Take 0.5 mg by mouth at bedtime.     amLODipine  (NORVASC ) 10 MG tablet Take 0.5 tablets (5 mg total) by mouth daily. 45 tablet 2   atorvastatin  (LIPITOR) 10 MG tablet Take 10 mg by mouth daily.     b complex vitamins capsule Take 1 capsule by mouth daily.     Bevacizumab  (AVASTIN ) 100 MG/4ML SOLN 100 mg as directed. Getting every 24 weeks Eye injection     carbidopa -levodopa  (SINEMET  IR) 25-100 MG per tablet Take 1 tablet by mouth in the morning and at bedtime.      cyanocobalamin  1000 MCG tablet Take 1 tablet (1,000 mcg total) by mouth daily. 30 tablet 11   folic acid  (FOLVITE ) 1 MG tablet Take 1 tablet (1 mg total) by mouth daily. 30 tablet 11   guaiFENesin  (MUCINEX ) 600 MG 12 hr tablet Take 1 tablet (600 mg total) by mouth 2 (two) times daily. 60 each 0   lactulose  (CHRONULAC ) 10 GM/15ML solution Take 30 mLs (20 g total) by mouth daily as needed for mild constipation or moderate constipation. 236 mL 1   lubiprostone (AMITIZA) 8 MCG capsule Take 8 mcg by mouth 2 (two) times daily with a meal.     melatonin 5 MG TABS Take 5 mg by mouth at bedtime as needed (sleep).      Nebulizers (COMPRESSOR/NEBULIZER) MISC 1 Units by Does not apply route daily as needed. 1 each 0   nicotine  (NICODERM CQ  - DOSED IN MG/24 HOURS) 21 mg/24hr patch Place 1 patch (21 mg total) onto the skin daily. 28 patch 0   nystatin  (MYCOSTATIN ) 100000 UNIT/ML suspension Take by  mouth.     omeprazole (PRILOSEC) 20 MG capsule Take 20 mg by mouth daily before breakfast.      ondansetron  (ZOFRAN -ODT) 4 MG disintegrating tablet Take 4 mg by mouth 4 (four) times daily as needed for nausea or vomiting. (Patient taking differently: Take 4 mg by mouth as needed for nausea or vomiting.)     polyethylene glycol (MIRALAX  / GLYCOLAX ) 17 g packet Take 17 g by mouth daily. 90 each 4   senna (SENOKOT) 8.6  MG TABS tablet Take 2 tablets (17.2 mg total) by mouth at bedtime. 60 tablet 11   sodium chloride  (OCEAN) 0.65 % SOLN nasal spray Place 1 spray into both nostrils daily as needed for congestion.     umeclidinium-vilanterol (ANORO ELLIPTA ) 62.5-25 MCG/ACT AEPB Inhale 1 puff into the lungs daily. 60 each 2   No current facility-administered medications for this visit.   Allergies:  Asa [aspirin], Morphine and codeine, and Influenza virus vaccine   Social History: The patient  reports that he has been smoking cigarettes. He started smoking about 54 years ago. He has a 54.4 pack-year smoking history. He has never been exposed to tobacco smoke. He has never used smokeless tobacco. He reports that he does not drink alcohol and does not use drugs.   Family History: The patient's family history includes AAA (abdominal aortic aneurysm) in his father; Constipation in his daughter; Healthy in his brother, daughter, and daughter; Heart disease in his father; Kidney disease in his father.   ROS:  Please see the history of present illness. Otherwise, complete review of systems is positive for none  All other systems are reviewed and negative.   Physical Exam: VS:  Ht 5' 8 (1.727 m)   Wt 153 lb 12.8 oz (69.8 kg)   BMI 23.39 kg/m , BMI Body mass index is 23.39 kg/m.  Wt Readings from Last 3 Encounters:  07/27/24 153 lb 12.8 oz (69.8 kg)  05/14/24 155 lb 6.4 oz (70.5 kg)  12/31/23 149 lb (67.6 kg)    General: Patient appears comfortable at rest. HEENT: Conjunctiva and lids normal,  oropharynx clear with moist mucosa. Neck: Supple, no elevated JVP or carotid bruits, no thyromegaly. Lungs: Clear to auscultation, nonlabored breathing at rest. Cardiac: Regular rate and rhythm, no S3 or significant systolic murmur, no pericardial rub. Abdomen: Soft, nontender, no hepatomegaly, bowel sounds present, no guarding or rebound. Extremities: No pitting edema, distal pulses 2+. Skin: Warm and dry. Musculoskeletal: No kyphosis. Neuropsychiatric: Alert and oriented x3, affect grossly appropriate.  Recent Labwork: 12/31/2023: B Natriuretic Peptide 49.0 01/01/2024: ALT 17; AST 28; BUN 10; Creatinine, Ser 0.66; Hemoglobin 15.8; Platelets 172; Potassium 3.8; Sodium 137; TSH 0.323  No results found for: CHOL, TRIG, HDL, CHOLHDL, VLDL, LDLCALC, LDLDIRECT  Assessment and Plan:   Frequent PACs and PVCs: EKG today showed frequent PACs.  Occasional PVC.  Does not have any symptoms of dizziness or palpitations.  He has a history of COPD.  At risk for A-fib.  Smokes.  Obtain 2-week event monitor to r/o intermittent atrial arrhythmias.  Moderate AR by echo in 2025: Asymptomatic.  Repeat echocardiogram in 1 year.  PAD s/p open AAA repair in 2007 and s/p L CIA stent graft in 2018: Not on aspirin 81 mg once daily due to severe nosebleeds in the past.  Continue atorvastatin  10 mg nightly.  Follows up with vascular surgery.  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  Smoking cessation counseling provided.  HTN, controlled: Continue current antihypertensives, amlodipine  10 mg once daily.  HLD, at goal: Continue atorvastatin  10 mg nightly.  Lipid panel reviewed from 05/2024.  LDL 68, TG 77, both within normal limits.  Nicotine  abuse: Current smoker.  Previously used to smoke 1 pack/day, currently smoking less than a pack a day.  Counseling provided.  I spent 45 minutes in  reviewing prior medical records, reports, more than the labs, discussion and documentation.  Medication Adjustments/Labs  and Tests Ordered: Current medicines are reviewed at length with the patient today.  Concerns regarding medicines are outlined above.    Disposition:  Follow up 6 months  Signed Ammi Hutt Priya Shuronda Santino, MD, 07/27/2024 2:21 PM    [facility] [address] [place], [place], [place] [address]  "

## 2024-07-27 NOTE — Patient Instructions (Addendum)
 Medication Instructions:  Your physician recommends that you continue on your current medications as directed. Please refer to the Current Medication list given to you today.   Labwork: None  Testing/Procedures: Your physician has requested that you have an echocardiogram in one year. Echocardiography is a painless test that uses sound waves to create images of your heart. It provides your doctor with information about the size and shape of your heart and how well your hearts chambers and valves are working. This procedure takes approximately one hour. There are no restrictions for this procedure. Please do NOT wear cologne, perfume, aftershave, or lotions (deodorant is allowed). Please arrive 15 minutes prior to your appointment time.  Please note: We ask at that you not bring children with you during ultrasound (echo/ vascular) testing. Due to room size and safety concerns, children are not allowed in the ultrasound rooms during exams. Our front office staff cannot provide observation of children in our lobby area while testing is being conducted. An adult accompanying a patient to their appointment will only be allowed in the ultrasound room at the discretion of the ultrasound technician under special circumstances. We apologize for any inconvenience.  Your physician has recommended that you wear a Zio monitor.   This monitor is a medical device that records the hearts electrical activity. Doctors most often use these monitors to diagnose arrhythmias. Arrhythmias are problems with the speed or rhythm of the heartbeat. The monitor is a small device applied to your chest. You can wear one while you do your normal daily activities. While wearing this monitor if you have any symptoms to push the button and record what you felt. Once you have worn this monitor for the period of time provider prescribed (for 14 days), you will return the monitor device in the postage paid box. Once it is returned they  will download the data collected and provide us  with a report which the provider will then review and we will call you with those results. Important tips:  Avoid showering during the first 24 hours of wearing the monitor. Avoid excessive sweating to help maximize wear time. Do not submerge the device, no hot tubs, and no swimming pools. Keep any lotions or oils away from the patch. After 24 hours you may shower with the patch on. Take brief showers with your back facing the shower head.  Do not remove patch once it has been placed because that will interrupt data and decrease adhesive wear time. Push the button when you have any symptoms and write down what you were feeling. Once you have completed wearing your monitor, remove and place into box which has postage paid and place in your outgoing mailbox.  If for some reason you have misplaced your box then call our office and we can provide another box and/or mail it off for you.   Follow-Up: Your physician recommends that you schedule a follow-up appointment in: 1 year. You will receive a reminder call in about 8 months reminding you to schedule your appointment. If you don't receive this call, please contact our office.   Any Other Special Instructions Will Be Listed Below (If Applicable). Thank you for choosing [facility]!     If you need a refill on your cardiac medications before your next appointment, please call your pharmacy.

## 2024-07-27 NOTE — Telephone Encounter (Signed)
 Checking percert on the following   2 week XT Dx: PAC

## 2024-07-30 ENCOUNTER — Institutional Professional Consult (permissible substitution) (INDEPENDENT_AMBULATORY_CARE_PROVIDER_SITE_OTHER)

## 2024-07-30 VITALS — BP 137/68 | HR 88 | Ht 68.0 in | Wt 153.8 lb

## 2024-07-30 DIAGNOSIS — H61891 Other specified disorders of right external ear: Secondary | ICD-10-CM | POA: Diagnosis not present

## 2024-07-30 DIAGNOSIS — H7191 Unspecified cholesteatoma, right ear: Secondary | ICD-10-CM

## 2024-07-30 DIAGNOSIS — H60391 Other infective otitis externa, right ear: Secondary | ICD-10-CM

## 2024-07-30 MED ORDER — CIPROFLOXACIN-DEXAMETHASONE 0.3-0.1 % OT SUSP: 4.0000 [drp] | Freq: Two times a day (BID) | OTIC | 0 refills | Status: AC

## 2024-07-30 NOTE — Patient Instructions (Signed)
 I have ordered an imaging study for you to complete prior to your next visit. Please call [facility] at [phone] to schedule your imaging if you have not received a call within 24 hours. If you are unable to complete your imaging study prior to your next scheduled visit please call our office to let us  know.

## 2024-07-30 NOTE — Progress Notes (Unsigned)
 Dear Dr. Joeann, Here is my assessment for our mutual patient, Brett Russell. Thank you for allowing me the opportunity to care for your patient. Please do not hesitate to contact me should you have any other questions. Sincerely, Dr. Hadassah Parody  Otolaryngology Clinic Note Referring provider: Dr. Joeann HPI:   Initial HPI (07/30/2024) Brett Russell is a 79 year old male with chronic right ear symptoms who presents for evaluation of right ear pain and infection.  Eight to ten weeks ago, developed increased right ear discomfort, initially as foreign body sensation. Progressed to otalgia with some swelling. He was diagnosed with OE and given antibiotics which at first did not help. He was then placed on ciprodex  and since then he has felt much better and feels as though pain has resolved.  He has no ear drainage.  No mastoid pain  No hearing loss.  No prior otologic surgery, tympanostomy tubes, or bleeding  He reports a chronic history of cerumen impactions and for many years (at least over 10 years) he has used an over the counter ear cleaner with peroxide in his. He has used this daily for at least 10 years on both ears and flushes his ears to prevent wax buildup.   No symptoms on the left side.   Denies any history of diabetes.    PMH: parkinson's, AAA   Independent Review of Additional Tests or Records:  Referral note 06/22/2024 Rosaline Joeann, FNP: Acute otitis externa, refer to ENT  01/02/2024 FT4 0.81 CMP 01/01/2024: Glucose 155   PMH/Meds/All/SocHx/FamHx/ROS:   Past Medical History:  Diagnosis Date   AAA (abdominal aortic aneurysm)    Allergy    takes Allegra daily   Anxiety    takes Xanax  nightly   Aortic aneurysm 2007   Arthritis    COPD (chronic obstructive pulmonary disease) (HCC)    Enlarged prostate    slightly   GERD (gastroesophageal reflux disease)    takes Omeprazole daily   Gout    takes Allopurinol  nightly   History of bronchitis     many yrs ago   History of colon polyps    benign   History of kidney stones    hx of   Hypertension    takes Amlodipine  daily   Insomnia    takes Melatonin nightly   Macular degeneration    wet-right eye .Injection of AvAStin  every 10 wks   Parkinson disease (HCC) 2006   takes Sinemet  daily   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     Past Surgical History:  Procedure Laterality Date   ABDOMINAL AORTIC ANEURYSM REPAIR  2007   ABDOMINAL AORTIC ENDOVASCULAR STENT GRAFT Left 12/03/2016   Procedure: INSERTION OF LEFT COMMON  ILIAC STENT-LEFT INTERNAL ILIAC  ARTERY COILING;  Surgeon: Oris Krystal FALCON, MD;  Location: [facility];  Service: Vascular;  Laterality: Left;   APPENDECTOMY     CHOLECYSTECTOMY     COLONOSCOPY N/A 11/29/2012   Procedure: COLONOSCOPY;  Surgeon: Claudis RAYMOND Rivet, MD;  Location: [facility];  Service: Endoscopy;  Laterality: N/A;  1200-moved to 930  Ann notified pt   COLONOSCOPY N/A 09/20/2018   Procedure: COLONOSCOPY;  Surgeon: Rivet Claudis RAYMOND, MD;  Location: [facility];  Service: Endoscopy;  Laterality: N/A;  2:25   COLONOSCOPY WITH PROPOFOL  N/A 10/13/2021   Procedure: COLONOSCOPY WITH PROPOFOL ;  Surgeon: Eartha Angelia Sieving, MD;  Location: [facility];  Service: Gastroenterology;  Laterality:  N/A;  115   CYST EXCISION N/A 01/07/2020   Procedure: excision of scrotal sebaceous cyst;  Surgeon: Sherrilee Belvie CROME, MD;  Location: [facility];  Service: Urology;  Laterality: N/A;   KIDNEY SURGERY  2007   Tumor removed   POLYPECTOMY  09/20/2018   Procedure: POLYPECTOMY;  Surgeon: Golda Claudis PENNER, MD;  Location: [facility];  Service: Endoscopy;;  colon   POLYPECTOMY  10/13/2021   Procedure: POLYPECTOMY INTESTINAL;  Surgeon: Eartha Flavors, Toribio, MD;  Location: [facility];  Service: Gastroenterology;;   Right knee     Cartilage removed at age 24    Family History  Problem Relation Age of Onset   Kidney disease Father    Heart disease Father     AAA (abdominal aortic aneurysm) Father    Healthy Brother    Healthy Daughter    Constipation Daughter    Healthy Daughter      Social Connections: Socially Isolated (12/31/2023)   Social Connection and Isolation Panel    Frequency of Communication with Friends and Family: More than three times a week    Frequency of Social Gatherings with Friends and Family: More than three times a week    Attends Religious Services: Never    Database Administrator or Organizations: No    Attends Engineer, Structural: Never    Marital Status: Divorced     Current Outpatient Medications  Medication Instructions   acetaminophen  (TYLENOL ) 650 mg, Oral, Every 6 hours PRN   albuterol  (PROVENTIL ) 2.5 mg, Nebulization, Every 6 hours PRN   albuterol  (VENTOLIN  HFA) 108 (90 Base) MCG/ACT inhaler 2 puffs, Inhalation, Every 6 hours PRN   allopurinol  (ZYLOPRIM ) 300 mg, Daily at bedtime   ALPRAZolam  (XANAX ) 0.5 mg, Daily at bedtime   amLODipine  (NORVASC ) 5 mg, Oral, Daily   atorvastatin  (LIPITOR) 10 mg, Daily   Avastin  100 mg, As directed   b complex vitamins capsule 1 capsule, Oral, Daily   budesonide-glycopyrrolate -formoterol (BREZTRI AEROSPHERE) 160-9-4.8 MCG/ACT AERO inhaler 2 puffs, 2 times daily   carbidopa -levodopa  (SINEMET  IR) 25-100 MG per tablet 1 tablet, 2 times daily   ciprofloxacin -dexamethasone  (CIPRODEX ) OTIC suspension 4 drops, Right EAR, 2 times daily   cyanocobalamin  1,000 mcg, Oral, Daily   folic acid  (FOLVITE ) 1 mg, Oral, Daily   guaiFENesin  (MUCINEX ) 600 mg, Oral, 2 times daily   lactulose  (CHRONULAC ) 20 g, Oral, Daily PRN   lubiprostone (AMITIZA) 8 mcg, 2 times daily with meals   melatonin 5 mg, At bedtime PRN   montelukast (SINGULAIR) 10 mg, Daily   Nebulizers (COMPRESSOR/NEBULIZER) MISC 1 Units, Does not apply, Daily PRN   nicotine  (NICODERM CQ  - DOSED IN MG/24 HOURS) 21 mg, Transdermal, Daily   nystatin  (MYCOSTATIN ) 100000 UNIT/ML suspension Take by mouth.   omeprazole  (PRILOSEC) 20 mg, Daily before breakfast   ondansetron  (ZOFRAN -ODT) 4 mg, 4 times daily PRN   polyethylene glycol (MIRALAX  / GLYCOLAX ) 17 g, Oral, Daily   pseudoephedrine (SUDAFED) 120 mg, Daily PRN   senna (SENOKOT) 17.2 mg, Oral, Daily at bedtime   sodium chloride  (OCEAN) 0.65 % SOLN nasal spray 1 spray, Daily PRN   umeclidinium-vilanterol (ANORO ELLIPTA ) 62.5-25 MCG/ACT AEPB 1 puff, Inhalation, Daily     Physical Exam:   BP 137/68 (BP Location: Right Arm, Patient Position: Sitting)   Pulse 88   Ht 5' 8 (1.727 m)   Wt 153 lb 12.8 oz (69.8 kg)   SpO2 90%   BMI 23.39 kg/m   Salient findings:  CN II-XII intact  Given history and complaints, ear microscopy was indicated and performed for evaluation with findings as below in physical exam section and in procedures.  Right ear: Wet debris present in ear canal on posterior wall just inside the meatus. This was painful to clean. A small portion was cleaned superficially and following this able to see that there is an infection eroding the bone here. Some purulence present and what looks like keratin debris. No granulation tissue. TM intact with well pneumatized middle ear spaces Left ear: No wax present. The posterior canal wall on this side does not have a bony defect but there is evidence of chronic erosion of the canal wall and I can see mastoid air cells through the very translucent amount of bone that is left covering them. TM intact with well pneumatized middle ear space.  No obviously palpable neck masses/lymphadenopathy/thyromegaly  No respiratory distress or stridor  Seprately Identifiable Procedures:  Prior to initiating any procedures, risks/benefits/alternatives were explained to the patient and verbal consent obtained.  Procedure (07/30/2024): Bilateral ear microscopy using microscope (CPT [id number]) Pre-procedure diagnosis: right otitis externa Post-procedure diagnosis: same, right bony canal wall defect  Indication: see above;  given patient's otologic complaints and history, for improved and comprehensive examination of external ear and tympanic membrane, bilateral otologic examination using microscope was performed. Prior to proceeding, verbal consent was obtained after discussion of R/B/A  Procedure: Patient was placed semi-recumbent. Both ear canals were examined using the microscope with findings above. Patient tolerated the procedure well.   Impression & Plans:  Karim Aiello is a 79 y.o. male with ***  1. Bony defect of right ear canal   2. Cholesteatoma of right ear     Assessment and Plan Assessment & Plan Concern for cholesteatoma of right ear with infection and bony erosion Concern for cholesteatoma of the right EAC with superimposed infection and bony erosion of the external auditory canal. Symptoms have improved with topical therapy.  Further imaging is required to assess the extent of bony involvement and guide management.  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  Malignant OE less likely also given that he is improved on drops alone.  Cancer on the differential but I think this is more likely a canal cholesteatoma at this time.  - Advised discontinuation of over-the-counter cerumenolytics and avoidance of further ear canal irrigation. - Recommended continuation of current topical antibiotic otic drops for the right ear as previously prescribed given that these are helping him and currently asymptomatic.  Will not start oral at this time but if he has worsening pain or drainage will start oral medication.  - Ordered CT scan of the temporal bone to evaluate the extent of bony erosion and further characterize the lesion. - Instructed to monitor for worsening symptoms, including increased otalgia, otorrhea, or hearing loss, and to contact the office if these occur.  Bony erosion of left ear canal  - No signs of infection  currently - Stop ceruminolytics    See below regarding exact medications prescribed this encounter including dosages and route: Meds ordered this encounter  Medications   ciprofloxacin -dexamethasone  (CIPRODEX ) OTIC suspension    Sig: Place 4 drops into the right ear 2 (two) times daily.    Dispense:  7.5 mL    Refill:  0    Thank you for allowing me the opportunity to care for your patient. Please do not hesitate to contact me should  you have any other questions.  Sincerely, Hadassah Parody, MD Otolaryngologist (ENT), [facility] Phone: [phone] Fax: [phone]  MDM:  Level *** Complexity/Problems addressed: ***- *** Data complexity: ***-  independent review of *** - Morbidity: ***- ***  - Prescription Drug prescribed or managed: ***

## 2024-07-31 ENCOUNTER — Telehealth (INDEPENDENT_AMBULATORY_CARE_PROVIDER_SITE_OTHER): Payer: Self-pay

## 2024-07-31 ENCOUNTER — Encounter: Payer: Self-pay | Admitting: Internal Medicine

## 2024-07-31 NOTE — Telephone Encounter (Signed)
 Patient stated that he is wearing a heart monitor and was calling to let you know that he is going to schedule the CT scan after he gets the heart monitor off.

## 2024-08-02 ENCOUNTER — Encounter

## 2024-08-09 ENCOUNTER — Ambulatory Visit: Admitting: Internal Medicine

## 2024-08-13 ENCOUNTER — Ambulatory Visit (HOSPITAL_COMMUNITY)

## 2024-08-16 ENCOUNTER — Ambulatory Visit

## 2024-08-16 VITALS — BP 132/81 | HR 88 | Resp 20 | Ht 68.0 in | Wt 153.0 lb

## 2024-08-16 DIAGNOSIS — I719 Aortic aneurysm of unspecified site, without rupture: Secondary | ICD-10-CM

## 2024-08-16 DIAGNOSIS — I712 Thoracic aortic aneurysm, without rupture, unspecified: Secondary | ICD-10-CM | POA: Insufficient documentation

## 2024-08-16 DIAGNOSIS — I7121 Aneurysm of the ascending aorta, without rupture: Secondary | ICD-10-CM | POA: Diagnosis not present

## 2024-08-16 NOTE — Progress Notes (Signed)
 "      [address] [place] [address]             [phone]            Brett Russell [id number] November 04, 1945   History of Present Illness:  Brett Russell is a 79 year old man with medical history of hypertension, moderate aortic regurgitation, peripheral artery disease, left iliac artery aneurysm with stent, premature atrial contraction, COPD, abdominal aortic aneurysm with repair, GERD, small bowel obstruction, Parkinson disease, hyperlipidemia, gout and tobacco user who presents for initial encounter of penetrating atherosclerotic ulcer of the aorta.  This was found on a low dose CT scan of chest for lung cancer screening.  CT scan showed a peripherally calcified penetrating ulcer or pseudoaneurysm along the transverse aorta measuring 1.9 x 3.3 cm. On review of CT scans in chart penetrating ulcer has been present since 2016 and measured 2.3 x 0.7 cm. Ulceration has been increasing over the past 10 years.   He presents to the clinic today with his sister. His blood pressure is controled with current medications.  His home blood pressure readings are 110-120/70s. He is still smoking cigarettes daily but is attempting to quit.  He is currently using nicorette gum. He denies chest pain, shortness of breath and lower leg edema.    Medications Ordered Prior to Encounter[1]   ROS: Review of Systems  Constitutional:  Negative for fever and malaise/fatigue.  Respiratory:  Negative for cough, shortness of breath and wheezing.   Cardiovascular:  Negative for chest pain, palpitations and leg swelling.  Neurological:  Negative for dizziness and headaches.     BP 132/81 (BP Location: Right Arm, Patient Position: Sitting, Cuff Size: Normal)   Pulse 88   Resp 20   Ht 5' 8 (1.727 m)   Wt 153 lb (69.4 kg)   SpO2 95% Comment: RA  BMI 23.26 kg/m   Physical Exam Constitutional:      Appearance: Normal appearance.  HENT:     Head: Normocephalic and  atraumatic.  Cardiovascular:     Rate and Rhythm: Normal rate and regular rhythm.     Heart sounds: Normal heart sounds, S1 normal and S2 normal.  Pulmonary:     Effort: Pulmonary effort is normal.     Breath sounds: Normal breath sounds.  Skin:    General: Skin is warm and dry.  Neurological:     General: No focal deficit present.     Mental Status: He is alert and oriented to person, place, and time.      Imaging: CLINICAL DATA:  Current 48 pack-year smoker.   EXAM: CT CHEST WITHOUT CONTRAST LOW-DOSE FOR LUNG CANCER SCREENING   TECHNIQUE: Multidetector CT imaging of the chest was performed following the standard protocol without IV contrast.   RADIATION DOSE REDUCTION: This exam was performed according to the departmental dose-optimization program which includes automated exposure control, adjustment of the mA and/or kV according to patient size and/or use of iterative reconstruction technique.   COMPARISON:  01/01/2015.   FINDINGS: Cardiovascular: Atherosclerotic calcification of the aorta. Peripherally calcified penetrating ulcer or pseudoaneurysm along the transverse aorta has enlarged, measuring 1.9 x 3.3 cm (2/26), previously 0.7 x 2.3 cm. Three-vessel coronary artery calcification. Enlarged pulmonic trunk. Heart is at the upper limits of normal in size. No pericardial effusion.   Mediastinum/Nodes: No pathologically enlarged mediastinal or axillary lymph nodes. Hilar regions are difficult to definitively evaluate without  IV contrast. Esophagus is grossly unremarkable.   Lungs/Pleura: Centrilobular and paraseptal emphysema. Ill-defined peribronchovascular ground-glass in the left upper and left lower lobes, considered infectious/inflammatory in etiology. 5.0 mm superior segment left lower lobe nodule, new (3/128). No pleural fluid. Airway is unremarkable.   Upper Abdomen: Partially imaged low-attenuation lesion off the left kidney. No specific follow-up  necessary. Gastric wall thickening. Visualized portions of the liver, adrenal glands, kidneys, spleen, pancreas, stomach and bowel are otherwise grossly unremarkable. No upper abdominal adenopathy.   Musculoskeletal: Degenerative changes in the spine.   IMPRESSION: 1. New 5 mm superior segment left lower lobe nodule. Lung-RADS 3, probably benign findings. Short-term follow-up in 6 months is recommended with repeat low-dose chest CT without contrast (please use the following order, CT CHEST LCS NODULE FOLLOW-UP W/O CM). These results will be called to the ordering clinician or representative by the Radiologist Assistant, and communication documented in the PACS or [employer]. 2. Scattered peribronchovascular ground-glass in the left upper and left lower lobes, considered infectious/inflammatory in etiology. 3. Enlarging penetrating ulcer or pseudoaneurysm off the aortic arch. Consider cardiothoracic surgical consult, as clinically indicated. 4. Gastric wall thickening. 5. Aortic atherosclerosis (ICD10-I70.0). Coronary artery calcification. 6. Enlarged pulmonic trunk, indicative of pulmonary arterial hypertension. 7.  Emphysema (ICD10-J43.9).     Electronically Signed   By: Newell Eke M.D.   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  -We discussed the natural history and and risk factors for growth. Discussed recommendations to minimize the risk of further expansion or dissection including careful blood pressure control, avoidance of contact sports and heavy lifting, attention to lipid management.  We covered the importance of smoking cessation.  The patient is aware of signs and symptoms of aortic dissection and when to present to the emergency department   -Follow up in 6 months with CTA of chest    Risk Modification:  Statin:  atorvastatin    Smoking cessation  instruction/counseling given:  counseled patient on the dangers of tobacco use, advised patient to stop smoking, and reviewed strategies to maximize success. He is to continue with nicorette gum.   Patient was counseled on importance of Blood Pressure Control  They are instructed to contact their Primary Care Physician if they start to have blood pressure readings over 130s/90s. Do not ever stop blood pressure medications on your own, unless instructed by healthcare professional.  Please avoid use of Fluoroquinolones as this can potentially increase your risk of Aortic Rupture and/or Dissection  Patient educated on signs and symptoms of Aortic Dissection, handout also provided in AVS  Manuelita CHRISTELLA Rough, PA-C 08/17/24     [1]  Current Outpatient Medications on File Prior to Visit  Medication Sig Dispense Refill   acetaminophen  (TYLENOL ) 325 MG tablet Take 2 tablets (650 mg total) by mouth every 6 (six) hours as needed for mild pain (pain score 1-3) or fever (or Fever >/= 101).     albuterol  (PROVENTIL ) (2.5 MG/3ML) 0.083% nebulizer solution Take 3 mLs (2.5 mg total) by nebulization every 6 (six) hours as needed for wheezing or shortness of breath. 84 each 12   albuterol  (VENTOLIN  HFA) 108 (90 Base) MCG/ACT inhaler Inhale 2 puffs into the lungs every 6 (six) hours as needed for wheezing or shortness of breath. 51 each 2   allopurinol  (ZYLOPRIM ) 300 MG tablet Take 300 mg by mouth at bedtime.  ALPRAZolam  (XANAX ) 1 MG tablet Take 0.5 mg by mouth at bedtime.     amLODipine  (NORVASC ) 10 MG tablet Take 0.5 tablets (5 mg total) by mouth daily. 45 tablet 2   atorvastatin  (LIPITOR) 10 MG tablet Take 10 mg by mouth daily.     b complex vitamins capsule Take 1 capsule by mouth daily.     Bevacizumab  (AVASTIN ) 100 MG/4ML SOLN 100 mg as directed. Getting every 24 weeks Eye injection     budesonide-glycopyrrolate -formoterol (BREZTRI AEROSPHERE) 160-9-4.8 MCG/ACT AERO inhaler Inhale 2 puffs into the  lungs 2 (two) times daily.     carbidopa -levodopa  (SINEMET  IR) 25-100 MG per tablet Take 1 tablet by mouth in the morning and at bedtime.  (Patient taking differently: Take 1 tablet by mouth 3 (three) times daily.)     ciprofloxacin -dexamethasone  (CIPRODEX ) OTIC suspension Place 4 drops into the right ear 2 (two) times daily. 7.5 mL 0   cyanocobalamin  1000 MCG tablet Take 1 tablet (1,000 mcg total) by mouth daily. 30 tablet 11   folic acid  (FOLVITE ) 1 MG tablet Take 1 tablet (1 mg total) by mouth daily. 30 tablet 11   guaiFENesin  (MUCINEX ) 600 MG 12 hr tablet Take 1 tablet (600 mg total) by mouth 2 (two) times daily. 60 each 0   lactulose  (CHRONULAC ) 10 GM/15ML solution Take 30 mLs (20 g total) by mouth daily as needed for mild constipation or moderate constipation. 236 mL 1   lubiprostone (AMITIZA) 8 MCG capsule Take 8 mcg by mouth 2 (two) times daily with a meal.     melatonin 5 MG TABS Take 5 mg by mouth at bedtime as needed (sleep).      montelukast (SINGULAIR) 10 MG tablet Take 10 mg by mouth daily.     Nebulizers (COMPRESSOR/NEBULIZER) MISC 1 Units by Does not apply route daily as needed. 1 each 0   nicotine  (NICODERM CQ  - DOSED IN MG/24 HOURS) 21 mg/24hr patch Place 1 patch (21 mg total) onto the skin daily. 28 patch 0   nystatin  (MYCOSTATIN ) 100000 UNIT/ML suspension Take by mouth.     omeprazole (PRILOSEC) 20 MG capsule Take 20 mg by mouth daily before breakfast.      ondansetron  (ZOFRAN -ODT) 4 MG disintegrating tablet Take 4 mg by mouth 4 (four) times daily as needed for nausea or vomiting.     polyethylene glycol (MIRALAX  / GLYCOLAX ) 17 g packet Take 17 g by mouth daily. 90 each 4   pseudoephedrine (SUDAFED) 120 MG 12 hr tablet Take 120 mg by mouth daily as needed for congestion (for runny nose).     senna (SENOKOT) 8.6 MG TABS tablet Take 2 tablets (17.2 mg total) by mouth at bedtime. 60 tablet 11   sodium chloride  (OCEAN) 0.65 % SOLN nasal spray Place 1 spray into both nostrils daily  as needed for congestion.     umeclidinium-vilanterol (ANORO ELLIPTA ) 62.5-25 MCG/ACT AEPB Inhale 1 puff into the lungs daily. 60 each 2   No current facility-administered medications on file prior to visit.   "

## 2024-08-17 ENCOUNTER — Ambulatory Visit (HOSPITAL_COMMUNITY): Admission: RE | Admit: 2024-08-17 | Discharge: 2024-08-17 | Disposition: A | Source: Ambulatory Visit

## 2024-08-17 DIAGNOSIS — H61891 Other specified disorders of right external ear: Secondary | ICD-10-CM

## 2024-08-17 DIAGNOSIS — H7191 Unspecified cholesteatoma, right ear: Secondary | ICD-10-CM | POA: Insufficient documentation

## 2024-08-17 NOTE — Patient Instructions (Signed)
" °  Continue control of blood pressure (prefer BP 130/80 or less)   2. Avoid fluoroquinolone antibiotics (I.e Ciprofloxacin , Avelox, Levofloxacin, Ofloxacin)   3.  Use of statin (to decrease cardiovascular risk)   4.  Exercise and activity limitations is individualized, but in general, contact sports are to be avoided and one should avoid heavy lifting (defined as half of ideal body weight) and exercises involving sustained Valsalva maneuver.   5.  Follow-up in 6 months with CTA chest.   "

## 2024-08-21 ENCOUNTER — Ambulatory Visit (HOSPITAL_COMMUNITY)
Admission: RE | Admit: 2024-08-21 | Discharge: 2024-08-21 | Disposition: A | Payer: Medicare HMO | Source: Ambulatory Visit | Attending: Urology

## 2024-08-21 DIAGNOSIS — N2889 Other specified disorders of kidney and ureter: Secondary | ICD-10-CM

## 2024-08-27 ENCOUNTER — Ambulatory Visit (INDEPENDENT_AMBULATORY_CARE_PROVIDER_SITE_OTHER)

## 2024-08-31 ENCOUNTER — Ambulatory Visit: Payer: Medicare HMO | Admitting: Urology

## 2024-10-09 ENCOUNTER — Encounter

## 2024-10-16 ENCOUNTER — Ambulatory Visit

## 2024-10-16 ENCOUNTER — Encounter

## 2024-10-23 ENCOUNTER — Encounter

## 2024-10-30 ENCOUNTER — Encounter

## 2024-10-30 ENCOUNTER — Ambulatory Visit

## 2024-11-15 ENCOUNTER — Ambulatory Visit: Admitting: Neurology

## 2025-07-25 ENCOUNTER — Ambulatory Visit
